# Patient Record
Sex: Male | Born: 1935 | Race: Black or African American | Hispanic: No | Marital: Single | Smoking: Former smoker
Health system: Southern US, Community
[De-identification: ages and names within clinical notes are randomized; demographics above are authoritative.]

## PROBLEM LIST (undated history)

## (undated) DIAGNOSIS — H9193 Unspecified hearing loss, bilateral: Secondary | ICD-10-CM

## (undated) DIAGNOSIS — I10 Essential (primary) hypertension: Secondary | ICD-10-CM

## (undated) DIAGNOSIS — M7520 Bicipital tendinitis, unspecified shoulder: Secondary | ICD-10-CM

## (undated) HISTORY — DX: Bicipital tendinitis, unspecified shoulder: M75.20

## (undated) HISTORY — PX: APPENDECTOMY: SHX54

## (undated) HISTORY — DX: Unspecified hearing loss, bilateral: H91.93

## (undated) HISTORY — DX: Essential (primary) hypertension: I10

---

## 2010-09-28 ENCOUNTER — Emergency Department (HOSPITAL_COMMUNITY)
Admission: EM | Admit: 2010-09-28 | Discharge: 2010-09-28 | Disposition: A | Discharge status: Home / Self Care | Payer: Medicare Other | Attending: Emergency Medicine | Admitting: Emergency Medicine

## 2010-09-28 DIAGNOSIS — I10 Essential (primary) hypertension: Secondary | ICD-10-CM | POA: Insufficient documentation

## 2010-09-28 DIAGNOSIS — K6289 Other specified diseases of anus and rectum: Secondary | ICD-10-CM | POA: Insufficient documentation

## 2010-09-28 DIAGNOSIS — K648 Other hemorrhoids: Secondary | ICD-10-CM | POA: Insufficient documentation

## 2010-10-12 HISTORY — PX: CATARACT EXTRACTION W/ INTRAOCULAR LENS IMPLANT: SHX1309

## 2010-12-23 ENCOUNTER — Encounter: Payer: Self-pay | Admitting: Internal Medicine

## 2010-12-23 ENCOUNTER — Ambulatory Visit (INDEPENDENT_AMBULATORY_CARE_PROVIDER_SITE_OTHER): Payer: Medicare Other | Admitting: Internal Medicine

## 2010-12-23 ENCOUNTER — Ambulatory Visit (INDEPENDENT_AMBULATORY_CARE_PROVIDER_SITE_OTHER)
Admission: RE | Admit: 2010-12-23 | Discharge: 2010-12-23 | Disposition: A | Discharge status: Home / Self Care | Payer: Medicare Other | Source: Ambulatory Visit | Attending: Internal Medicine | Admitting: Internal Medicine

## 2010-12-23 DIAGNOSIS — M7520 Bicipital tendinitis, unspecified shoulder: Secondary | ICD-10-CM

## 2010-12-23 DIAGNOSIS — I10 Essential (primary) hypertension: Secondary | ICD-10-CM

## 2010-12-23 DIAGNOSIS — M5412 Radiculopathy, cervical region: Secondary | ICD-10-CM

## 2010-12-23 DIAGNOSIS — Z0000 Encounter for general adult medical examination without abnormal findings: Secondary | ICD-10-CM

## 2010-12-23 NOTE — Patient Instructions (Signed)
Left shoulder pain: looks like two problems 1. Tendonitis of the biceps tendon - the pain at the front of the shoulder that is tender to touch. Plan - rub of choice, e.g. Icy-hot, aspercreme, etc           Aleve 1 or 2 twice a day           Range of motion exercise to stretch out the tendon: go to YouTube.com search biceps tendonitis and stretch and you will get lots of video instruction  2. Pain that radiates down the shoulder to the arm and to the hand raises the question of cervical arthritis with nerve root impingement Plan - x-rays of the neck today  Health maintenance - please obtain lab records from the [place].    Bicipital Tendonitis Bicipital tendonitis refers to redness, soreness, and swelling (inflammation) or irritation of the bicep tendon. The biceps muscle is located between the elbow and shoulder of the inner arm. The tendon heads, similar to pieces of rope, connect the bicep muscle to the shoulder socket. They are called short head and long head tendons. When tendonitis occurs, the long head tendon is inflamed and swollen, and may be thickened or partially torn.   Bicipital tendonitis can occur with other problems as well, such as arthritis in the shoulder or acromioclavicular joints, tears in the tendons, or other rotator cuff problems.   CAUSES   Overuse of of the arms for overhead activities is the major cause of tendonitis. Many athletes, such as swimmers, baseball players, and tennis players are prone to bicipital tendonitis. Jobs that require manual labor or routine chores, especially chores involving overhead activities can result in overuse and tendonitis. SYMPTOMS Symptoms may include:  Pain in and around the front of the shoulder. Pain may be worse with overhead motion.     Pain or aching that radiates down the arm.     Clicking or shifting sensations in the shoulder.  DIAGNOSIS Your caregiver may perform the following:  Physical exam and tests of the biceps and  shoulder to observe range of motion, strength, and stability.     X-rays or magnetic resonance imaging (MRI) to confirm the diagnosis. In most common cases, these tests are not necessary.  Since other problems may exist in the shoulder or rotator cuff, additional tests may be recommended. TREATMENT Treatment may include the following:  Medications     Your caregiver may prescribe over-the-counter pain relievers.     Steroid injections, such as cortisone, may be recommended. These may help to reduce inflammation and pain.     Physical Therapy - Your caregiver may recommend gentle exercises with the arm. These can help restore strength and range of motion. They may be done at home or with a physical therapist's supervision and input.     Surgery - Arthroscopic or open surgery sometimes is necessary. Surgery may include:     Reattachment or repair of the tendon at the shoulder socket.     Removal of the damaged section of the tendon.     Anchoring the tendon to a different area of the shoulder (tenodesis).  HOME CARE INSTRUCTIONS    Avoid overhead motion of the affected arm or any other motion that causes pain.     Take medication for pain as directed. Do not take these for more than 3 weeks, unless directed to do so by your caregiver.     Ice the affected area for 20 minutes at a time, 3-4 times  per day. Place a towel on the skin over the painful area and the ice or cold pack over the towel. Do not place ice directly on the skin.     Perform gentle exercises at home as directed. These will increase strength and flexibility.  PREVENTION  Modify your activities as much as possible to protect your arm. A physical therapist or sports medicine physician can help you understand options for safe motion.     Avoid repetitive overhead pulling, lifting, reaching, and throwing until your caregiver tells you it is ok to resume these activities.  SEEK MEDICAL CARE IF:  Your pain worsens.      You have difficulty moving the affected arm.     You have trouble performing any of the self-care instructions.  MAKE SURE YOU:    Understand these instructions.     Will watch your condition.     Will get help right away if you are not doing well or get worse.  Document Released: 01/30/2010 Document Revised: 09/09/2010 Document Reviewed: 01/30/2010 [facility] Patient Information 2012 [place], [place].

## 2010-12-23 NOTE — Progress Notes (Signed)
Subjective:    Patient ID: Jeff Lamb, male    DOB: 01-27-1929, 77 y.o.   MRN: [id number]  HPI Jeff Lamb presents to establish for on-going primary care. He has been getting his care from Urgent [facility] on [address].  His chief complaint is pain in the left shoulder area for the past six months. It is very positional/exertional related. The pain will radiate from the trapezius to the deltoid and occasionally to the hand. No weakness in the hand, no paresthesia. He describes the pain as a shooting pain. He was seen at Urgent care and did have a steroid trigger point injection. 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  Past Medical History  Diagnosis Date  . Hypertension   . 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 Appendectomy     31  . Cataract extraction w/ intraocular lens implant October '12    left.   Family History  Problem Relation Age of Onset  . Stroke Mother   . Hypertension Mother   . Cancer Maternal Uncle     throat cancer  . Diabetes Neg Hx   . Heart disease Neg Hx   . Cancer Maternal Uncle     throat cancer   History   Social History  . Marital Status: Single    Spouse Name: N/A    Number of Children: 1  . Years of Education: 12   Occupational History  . brakeman -railroad    Social History Main Topics  . Smoking status: Former Smoker -- 5.0 packs/day for 20 years    Types: Cigars    Quit date: 12/22/1980  . Smokeless tobacco: Never Used  . Alcohol Use: Yes     ,moderate use  . Drug Use: No  . Sexually Active: Not on file   Other Topics Concern  . 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 Married ''84 - 1 year /divorced. 1 son '62. 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 Lives alone. Has cousins in the area. Son is principle contact: [name]  (c) [phone].       Review of Systems Constitutional:  Negative for fever, chills, activity change and unexpected weight change.  HEENT:  Negative for hearing loss, ear pain, congestion, neck stiffness and postnasal drip. Negative for sore throat or swallowing problems. Negative for dental complaints.   Eyes: Negative for vision loss or change in visual acuity.  Respiratory: Negative for chest tightness and wheezing. Negative for DOE.   Cardiovascular: Negative for chest pain or palpitations. No decreased exercise tolerance Gastrointestinal: No change in bowel habit. No bloating or gas. No reflux or indigestion Genitourinary: Negative for urgency, frequency, flank pain and difficulty urinating.  Musculoskeletal: Negative for myalgias, back pain, arthralgias and gait problem.  Neurological: Negative for dizziness, tremors, weakness and headaches.  Hematological: Negative for adenopathy.  Psychiatric/Behavioral: Negative for behavioral problems and dysphoric mood.   No current outpatient prescriptions on file prior to visit.       Objective:   Physical Exam Vitals reviewed - normal Gen'l- WNWD AA man who looks younger than his stated age HEENT - [place]/AT, C&S clear, PERRLA Pulm - normal respirations Heart/vascular - 2+ radial pulse, RRR Ext - good passive ROM left  shoulder, no crepitus or click. Very tender to deep palpation short head of biceps, mild tenderness to palpation long head biceps. Full ROM neck.        Assessment & Plan:

## 2010-12-24 DIAGNOSIS — M7520 Bicipital tendinitis, unspecified shoulder: Secondary | ICD-10-CM | POA: Insufficient documentation

## 2010-12-24 DIAGNOSIS — Z0000 Encounter for general adult medical examination without abnormal findings: Secondary | ICD-10-CM | POA: Insufficient documentation

## 2010-12-24 HISTORY — DX: Bicipital tendinitis, unspecified shoulder: M75.20

## 2010-12-24 NOTE — Assessment & Plan Note (Addendum)
Patient followed by [facility] and prescribed ACE-I/Hct. BP marginally controlled with SBP 144  Plan - continue present medication

## 2010-12-24 NOTE — Assessment & Plan Note (Signed)
Exam reveals tenderness at the biceps tendons. He does have pain that radiates from trapezius to hand which may suggest concomitant pain related to mild cervical disease.  Plan - otc NSAID of choice, GI precautions given           Lineament and heat           Stretch - referred to [email or web address]           C-spine x-rays with recommendations to follow.

## 2010-12-24 NOTE — Assessment & Plan Note (Signed)
Patient reports he has been followed at the [place]. He is asked to release information including lab studies and any testing, e.g. EKGs, colonoscopy etc.  He is to return for a complete physical exam/Medicare wellness at his convenience.

## 2010-12-25 ENCOUNTER — Telehealth: Payer: Self-pay | Admitting: Internal Medicine

## 2010-12-25 NOTE — Telephone Encounter (Signed)
Received copies from [facility] on 12.14.12. Forwarded 32  pages to Dr. Debby Bud ,for review. sj

## 2010-12-27 ENCOUNTER — Telehealth: Payer: Self-pay | Admitting: Internal Medicine

## 2010-12-27 DIAGNOSIS — M79602 Pain in left arm: Secondary | ICD-10-CM

## 2010-12-27 NOTE — Telephone Encounter (Signed)
Cervical x-rays suggest a pinched nerve at C4-5. Will schedule an MRI. U will hear from the [facility] about time and place.

## 2010-12-28 NOTE — Telephone Encounter (Signed)
Informed pt .

## 2011-01-04 ENCOUNTER — Ambulatory Visit (HOSPITAL_COMMUNITY)
Admission: RE | Admit: 2011-01-04 | Discharge: 2011-01-04 | Disposition: A | Discharge status: Home / Self Care | Payer: Medicare Other | Source: Ambulatory Visit | Attending: Internal Medicine | Admitting: Internal Medicine

## 2011-01-04 DIAGNOSIS — M5030 Other cervical disc degeneration, unspecified cervical region: Secondary | ICD-10-CM | POA: Insufficient documentation

## 2011-01-04 DIAGNOSIS — M542 Cervicalgia: Secondary | ICD-10-CM | POA: Insufficient documentation

## 2011-01-04 DIAGNOSIS — M47812 Spondylosis without myelopathy or radiculopathy, cervical region: Secondary | ICD-10-CM | POA: Insufficient documentation

## 2011-01-04 DIAGNOSIS — M79609 Pain in unspecified limb: Secondary | ICD-10-CM | POA: Insufficient documentation

## 2011-01-04 DIAGNOSIS — M79602 Pain in left arm: Secondary | ICD-10-CM

## 2011-01-11 ENCOUNTER — Encounter: Payer: Self-pay | Admitting: Internal Medicine

## 2011-01-19 ENCOUNTER — Ambulatory Visit (INDEPENDENT_AMBULATORY_CARE_PROVIDER_SITE_OTHER): Payer: Medicare Other | Admitting: Internal Medicine

## 2011-01-19 DIAGNOSIS — I10 Essential (primary) hypertension: Secondary | ICD-10-CM

## 2011-01-19 DIAGNOSIS — M7520 Bicipital tendinitis, unspecified shoulder: Secondary | ICD-10-CM

## 2011-01-19 DIAGNOSIS — M5030 Other cervical disc degeneration, unspecified cervical region: Secondary | ICD-10-CM

## 2011-01-19 MED ORDER — VARDENAFIL HCL 20 MG PO TABS: 20.0000 mg | ORAL_TABLET | Freq: Every day | ORAL | Status: AC | PRN

## 2011-01-19 NOTE — Assessment & Plan Note (Signed)
May have a component of tendonitis contributing to his discomfort.

## 2011-01-19 NOTE — Assessment & Plan Note (Signed)
Mild discomfort without weakness or dense paresthesia.

## 2011-01-19 NOTE — Patient Instructions (Signed)
Arm pain - some degree of tendonitis as previously diagnosed. MRI scan does reveal diffuse degenerative disk disease with some nerve root encroachment.  Plan - use of anti-inflammatory drugs, i.e. Aleve twice a day.

## 2011-01-19 NOTE — Assessment & Plan Note (Signed)
BP Readings from Last 3 Encounters:  01/19/11 132/88  12/23/10 144/82   OK control

## 2011-01-19 NOTE — Progress Notes (Signed)
  Subjective:    Patient ID: Jeff Lamb, male    DOB: 11-23-1929, 78 y.o.   MRN: [id number]  HPI Mr. Mentink presents to review MRI scan - he has  Been having left UE pain. MRI reveals diffuse DDD, with some foraminal encroachment worse at C8 nerve root.   I have reviewed the patient's medical history in detail and updated the computerized patient record.    Review of Systems System review is negative for any constitutional, cardiac, pulmonary, GI or neuro symptoms or complaints other than as described in the HPI.     Objective:   Physical Exam Filed Vitals:   01/19/11 1021  BP: 132/88  Pulse: 54  Temp: 98.1 F (36.7 C)   WNWD AA man looking younger than age. HEENT - /AT, PUlm - respirations Cor - RRR MSK - negative Tinel's and Phalen's sing left wrist.        Assessment & Plan:  (greater than 50% of 25 min visit spent on education and counseling)

## 2011-07-20 ENCOUNTER — Telehealth: Payer: Self-pay | Admitting: Internal Medicine

## 2011-07-20 DIAGNOSIS — I10 Essential (primary) hypertension: Secondary | ICD-10-CM

## 2011-07-20 MED ORDER — LISINOPRIL-HYDROCHLOROTHIAZIDE 20-25 MG PO TABS: 1.0000 | ORAL_TABLET | Freq: Every day | ORAL | Status: DC

## 2011-07-20 NOTE — Telephone Encounter (Signed)
Patient notified of need for OV and Lab work. Rx sent to [facility]

## 2011-07-20 NOTE — Telephone Encounter (Signed)
Ok to refill but needs labs - metabolic panel.

## 2011-07-20 NOTE — Telephone Encounter (Signed)
Patient scheduled for next available ov which is the 08/02/11.  He stated he has 8 days left on his med and is hoping he can get a refill to last until the 22nd.    Thanks!

## 2011-07-20 NOTE — Telephone Encounter (Signed)
Pt req refill for Lisinopril HCTZ 20-25mg . Last OV was 01/2011 with Dr. Debby Bud. Pt stated that Dr. Andi Devon was the presciber. Ok to refill? Please call pt.

## 2011-07-21 ENCOUNTER — Other Ambulatory Visit: Payer: Self-pay | Admitting: *Deleted

## 2011-07-21 MED ORDER — LISINOPRIL-HYDROCHLOROTHIAZIDE 20-25 MG PO TABS: 1.0000 | ORAL_TABLET | Freq: Every day | ORAL | Status: DC

## 2011-07-21 NOTE — Telephone Encounter (Signed)
Refill on Rx sent to [facility]

## 2011-08-02 ENCOUNTER — Encounter: Payer: Self-pay | Admitting: Internal Medicine

## 2011-08-02 ENCOUNTER — Ambulatory Visit (INDEPENDENT_AMBULATORY_CARE_PROVIDER_SITE_OTHER): Payer: Medicare Other | Admitting: Internal Medicine

## 2011-08-02 VITALS — BP 128/82 | HR 63 | Temp 98.5°F | Resp 16 | Wt 193.0 lb

## 2011-08-02 DIAGNOSIS — N529 Male erectile dysfunction, unspecified: Secondary | ICD-10-CM | POA: Insufficient documentation

## 2011-08-02 DIAGNOSIS — I10 Essential (primary) hypertension: Secondary | ICD-10-CM

## 2011-08-02 MED ORDER — TADALAFIL 20 MG PO TABS
20.0000 mg | ORAL_TABLET | ORAL | Status: DC | PRN
Start: 2011-08-02 — End: 2013-07-12

## 2011-08-02 NOTE — Assessment & Plan Note (Signed)
Associated with BP medication. Can get an erection but cannot maintain it to complete intercourse. Has had success with Viagra. He also has an application for Pos-T-Vac device.

## 2011-08-02 NOTE — Progress Notes (Signed)
  Subjective:    Patient ID: Jeff Lamb, male    DOB: 05-13-1929, 78 y.o.   MRN: [id number]  HPI Mr. Harkleroad presents for evaluation of ED. He has a Vac-T-pump form to be completed. 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 He has had a good response to viagra in the past. After discussion he would like to try pharmaco therapy over mechanical therapy.  Past Medical History  Diagnosis Date  . Hypertension   . 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 Biceps tendonitis 12/24/2010   Past Surgical History  Procedure Date  . Appendectomy     31  . Cataract extraction w/ intraocular lens implant October '12    left.   Family History  Problem Relation Age of Onset  . Stroke Mother   . Hypertension Mother   . Cancer Maternal Uncle     throat cancer  . Diabetes Neg Hx   . Heart disease Neg Hx   . Cancer Maternal Uncle     throat cancer   History   Social History  . Marital Status: Single    Spouse Name: N/A    Number of Children: 1  . Years of Education: 12   Occupational History  . brakeman -railroad    Social History Main Topics  . Smoking status: Former Smoker -- 5.0 packs/day for 20 years    Types: Cigars    Quit date: 12/22/1980  . Smokeless tobacco: Never Used  . Alcohol Use: Yes     ,moderate use  . Drug Use: No  . Sexually Active: Not on file   Other Topics Concern  . 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 Married ''55 - 1 year /divorced. 1 son '62. 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 Lives alone. Has cousins in the area. Son is principle contact: [name] (c) [phone].    Current Outpatient Prescriptions on File Prior to Visit  Medication Sig Dispense Refill  . lisinopril-hydrochlorothiazide (PRINZIDE,ZESTORETIC) 20-25 MG per tablet Take 1 tablet by mouth daily. Patient needs to make appt. With Dr. Debby Bud for lab  work and OV  90 tablet  3  . tadalafil (CIALIS) 20 MG tablet Take 1 tablet (20 mg total) by mouth every other day as needed for erectile dysfunction.  5 tablet  11      Review of Systems System review is negative for any constitutional, cardiac, pulmonary, GI or neuro symptoms or complaints other than as described in the HPI.     Objective:   Physical Exam Filed Vitals:   08/02/11 1021  BP: 128/82  Pulse: 63  Temp: 98.5 F (36.9 C)  Resp: 16   Cor- RRR Pulm - normal respirations Neuro - A&O x 3.       Assessment & Plan:

## 2011-08-02 NOTE — Assessment & Plan Note (Signed)
BP Readings from Last 3 Encounters:  08/02/11 128/82  01/19/11 132/88  12/23/10 144/82

## 2012-06-20 ENCOUNTER — Ambulatory Visit: Payer: Medicare Other | Admitting: Internal Medicine

## 2012-07-13 ENCOUNTER — Ambulatory Visit (INDEPENDENT_AMBULATORY_CARE_PROVIDER_SITE_OTHER): Payer: Medicare Other | Admitting: Internal Medicine

## 2012-07-13 ENCOUNTER — Encounter: Payer: Self-pay | Admitting: Internal Medicine

## 2012-07-13 VITALS — BP 140/80 | HR 54 | Temp 97.0°F | Resp 12 | Ht 70.0 in | Wt 182.0 lb

## 2012-07-13 DIAGNOSIS — I10 Essential (primary) hypertension: Secondary | ICD-10-CM

## 2012-07-13 DIAGNOSIS — K649 Unspecified hemorrhoids: Secondary | ICD-10-CM

## 2012-07-13 DIAGNOSIS — R634 Abnormal weight loss: Secondary | ICD-10-CM

## 2012-07-13 NOTE — Patient Instructions (Addendum)
Hemorrhoids - best thing to do is to insure an easy, well formed bowel movement. Use a bulk laxative, e.g. Metamucil, benefiber, etc. Do not sit on the [place] for long. For a flare of hemorrhoids you can try preparation H, for pain you can use Nupercainal ointment (comes as a generic).  Weight -   Vitals - 1 value per visit 07/13/2012 08/02/2011 01/19/2011 12/23/2010  Weight (lb) 182 193 202 200   18 lbs in 18 months. The fact that you have no symptoms: belly pain, indigestion, bloating, etc makes it less worrisome.  If you continue to loose weight at this rate, 1 lb a month, you will need to have some testing.  Hemorrhoids Hemorrhoids are swollen veins around the rectum or anus. There are two types of hemorrhoids:   Internal hemorrhoids. These occur in the veins just inside the rectum. They may poke through to the outside and become irritated and painful.  External hemorrhoids. These occur in the veins outside the anus and can be felt as a painful swelling or hard lump near the anus. CAUSES  Pregnancy.   Obesity.   Constipation or diarrhea.   Straining to have a bowel movement.   Sitting for long periods on the toilet.  Heavy lifting or other activity that caused you to strain.  Anal intercourse. SYMPTOMS   Pain.   Anal itching or irritation.   Rectal bleeding.   Fecal leakage.   Anal swelling.   One or more lumps around the anus.  DIAGNOSIS  Your caregiver may be able to diagnose hemorrhoids by visual examination. Other examinations or tests that may be performed include:   Examination of the rectal area with a gloved hand (digital rectal exam).   Examination of anal canal using a small tube (scope).   A blood test if you have lost a significant amount of blood.  A test to look inside the colon (sigmoidoscopy or colonoscopy). TREATMENT Most hemorrhoids can be treated at home. However, if symptoms do not seem to be getting better or if you have a lot of  rectal bleeding, your caregiver may perform a procedure to help make the hemorrhoids get smaller or remove them completely. Possible treatments include:   Placing a rubber band at the base of the hemorrhoid to cut off the circulation (rubber band ligation).   Injecting a chemical to shrink the hemorrhoid (sclerotherapy).   Using a tool to burn the hemorrhoid (infrared light therapy).   Surgically removing the hemorrhoid (hemorrhoidectomy).   Stapling the hemorrhoid to block blood flow to the tissue (hemorrhoid stapling).  HOME CARE INSTRUCTIONS   Eat foods with fiber, such as whole grains, beans, nuts, fruits, and vegetables. Ask your doctor about taking products with added fiber in them (fibersupplements).  Increase fluid intake. Drink enough water and fluids to keep your urine clear or pale yellow.   Exercise regularly.   Go to the bathroom when you have the urge to have a bowel movement. Do not wait.   Avoid straining to have bowel movements.   Keep the anal area dry and clean. Use wet toilet paper or moist towelettes after a bowel movement.   Medicated creams and suppositories may be used or applied as directed.   Only take over-the-counter or prescription medicines as directed by your caregiver.   Take warm sitz baths for 15 20 minutes, 3 4 times a day to ease pain and discomfort.   Place ice packs on the hemorrhoids if they are tender and  swollen. Using ice packs between sitz baths may be helpful.   Put ice in a plastic bag.   Place a towel between your skin and the bag.   Leave the ice on for 15 20 minutes, 3 4 times a day.   Do not use a donut-shaped pillow or sit on the toilet for long periods. This increases blood pooling and pain.  SEEK MEDICAL CARE IF:  You have increasing pain and swelling that is not controlled by treatment or medicine.  You have uncontrolled bleeding.  You have difficulty or you are unable to have a bowel  movement.  You have pain or inflammation outside the area of the hemorrhoids. MAKE SURE YOU:  Understand these instructions.  Will watch your condition.  Will get help right away if you are not doing well or get worse. Document Released: 12/26/1999 Document Revised: 12/15/2011 Document Reviewed: 11/02/2011 [facility] Patient Information 2014 [place], [place].

## 2012-07-16 DIAGNOSIS — K649 Unspecified hemorrhoids: Secondary | ICD-10-CM | POA: Insufficient documentation

## 2012-07-16 NOTE — Assessment & Plan Note (Signed)
BP Readings from Last 3 Encounters:  07/13/12 140/80  08/02/11 128/82  01/19/11 132/88   Adequate control on present medications.  Plan Continue present regimen

## 2012-07-16 NOTE — Assessment & Plan Note (Signed)
Hemorrhoids - best thing to do is to insure an easy, well formed bowel movement. Use a bulk laxative, e.g. Metamucil, benefiber, etc. Do not sit on the [place] for long. For a flare of hemorrhoids you can try preparation H, for pain you can use Nupercainal ointment (comes as a generic).

## 2012-07-16 NOTE — Progress Notes (Signed)
Subjective:    Patient ID: Jeff Lamb, male    DOB: 06-12-1929, 79 y.o.   MRN: [id number]  HPI Jeff Lamb presents c/o hemorrhoidal discomfort now better than when he called for appointment. He has not had any bleeding or difficulty with defecation. This is not a new problem.   He is concerned about weight loss. He does report early satiety at times, poor appetite at times. He has had no fevers, chills, sweats, odynophagia, GERD, enlarged lymph nodes, change in bowel habit or any intercurrent illness. His sense of taste is preserved.  Past Medical History  Diagnosis Date  . Hypertension   . 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 Biceps tendonitis 12/24/2010   Past Surgical History  Procedure Laterality Date  . Appendectomy      31  . Cataract extraction w/ intraocular lens implant  October '12    left.   Family History  Problem Relation Age of Onset  . Stroke Mother   . Hypertension Mother   . Cancer Maternal Uncle     throat cancer  . Diabetes Neg Hx   . Heart disease Neg Hx   . Cancer Maternal Uncle     throat cancer   History   Social History  . Marital Status: Single    Spouse Name: N/A    Number of Children: 1  . Years of Education: 12   Occupational History  . brakeman -railroad    Social History Main Topics  . Smoking status: Former Smoker -- 5.00 packs/day for 20 years    Types: Cigars    Quit date: 12/22/1980  . Smokeless tobacco: Never Used  . Alcohol Use: Yes     Comment: ,moderate use  . Drug Use: No  . Sexually Active: Not on file   Other Topics Concern  . 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 Married ''16 - 1 year /divorced. 1 son '62. 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 Lives alone. Has cousins in the area. Son is principle contact: [name] (c) [phone].    Current Outpatient Prescriptions on File Prior  to Visit  Medication Sig Dispense Refill  . lisinopril-hydrochlorothiazide (PRINZIDE,ZESTORETIC) 20-25 MG per tablet Take 1 tablet by mouth daily. Patient needs to make appt. With Dr. Debby Bud for lab work and OV  90 tablet  3  . tadalafil (CIALIS) 20 MG tablet Take 1 tablet (20 mg total) by mouth every other day as needed for erectile dysfunction.  5 tablet  11   No current facility-administered medications on file prior to visit.      Review of Systems Constitutional:  Negative for fever, chills, activity change and unexpected weight change.  HEENT:  Negative for hearing loss, ear pain, congestion, neck stiffness and postnasal drip. Negative for sore throat or swallowing problems. Negative for dental complaints.   Eyes: Negative for vision loss or change in visual acuity.  Respiratory: Negative for chest tightness and wheezing. Negative for DOE.   Cardiovascular: Negative for chest pain or palpitations. No decreased exercise tolerance Gastrointestinal: No change in bowel habit. No bloating or gas. No reflux or indigestion Genitourinary: Negative for urgency, frequency, flank pain and difficulty urinating.  Musculoskeletal: Negative for myalgias, back pain, arthralgias and gait problem.  Neurological: Negative for dizziness, tremors, weakness and headaches.  Hematological: Negative for adenopathy.  Psychiatric/Behavioral: Negative for behavioral problems and dysphoric mood.       Objective:   Physical Exam Filed Vitals:   07/13/12 1530  BP: 140/80  Pulse: 54  Temp: 97 F (36.1 C)  Resp: 12   Wt Readings from Last 3 Encounters:  07/13/12 182 lb (82.555 kg)  08/02/11 193 lb (87.544 kg)  01/19/11 202 lb (91.627 kg)   Gen'l- WNWD AA man in no distress HEENT - C&S w/o icterus Cor- RRR Pulm - normal respirations Abd - normal BS w/o borborygmi, not tender Rectal - deferred Neuro - a&O x 3      Assessment & Plan:  1. Weight loss    Vitals - 1 value per visit 07/13/2012  08/02/2011 01/19/2011 12/23/2010  Weight (lb) 182 193 202 200   18 lbs in 18 months. The fact that you have no symptoms: belly pain, indigestion, bloating, etc makes it less worrisome.  If you continue to loose weight at this rate, 1 lb a month, you will need to have some testing.

## 2012-08-10 ENCOUNTER — Other Ambulatory Visit: Payer: Self-pay | Admitting: Internal Medicine

## 2013-02-19 ENCOUNTER — Ambulatory Visit (INDEPENDENT_AMBULATORY_CARE_PROVIDER_SITE_OTHER): Payer: Medicare Other | Admitting: Internal Medicine

## 2013-02-19 ENCOUNTER — Encounter: Payer: Self-pay | Admitting: Internal Medicine

## 2013-02-19 DIAGNOSIS — Z0000 Encounter for general adult medical examination without abnormal findings: Secondary | ICD-10-CM

## 2013-02-19 DIAGNOSIS — Z0289 Encounter for other administrative examinations: Secondary | ICD-10-CM

## 2013-02-19 DIAGNOSIS — I10 Essential (primary) hypertension: Secondary | ICD-10-CM
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
## 2013-02-22 ENCOUNTER — Ambulatory Visit: Payer: Medicare Other | Admitting: Internal Medicine

## 2013-03-22 ENCOUNTER — Ambulatory Visit: Payer: Medicare Other | Admitting: Internal Medicine

## 2013-04-14 NOTE — Progress Notes (Signed)
   Subjective:    Patient ID: Jeff DownerJohn Lamb, male    DOB: [birth year], 80 y.o.   MRN: [id number]  HPI Did not show up for appointment   Review of Systems     Objective:   Physical Exam        Assessment & Plan:

## 2013-07-05 ENCOUNTER — Ambulatory Visit: Payer: Medicare Other | Admitting: Internal Medicine

## 2013-07-12 ENCOUNTER — Ambulatory Visit (INDEPENDENT_AMBULATORY_CARE_PROVIDER_SITE_OTHER): Payer: 59 | Admitting: Internal Medicine

## 2013-07-12 ENCOUNTER — Encounter: Payer: Self-pay | Admitting: Internal Medicine

## 2013-07-12 VITALS — BP 168/92 | HR 56 | Temp 98.2°F | Wt 181.2 lb

## 2013-07-12 DIAGNOSIS — E785 Hyperlipidemia, unspecified: Secondary | ICD-10-CM | POA: Insufficient documentation

## 2013-07-12 DIAGNOSIS — I63332 Cerebral infarction due to thrombosis of left posterior cerebral artery: Secondary | ICD-10-CM

## 2013-07-12 DIAGNOSIS — R609 Edema, unspecified: Secondary | ICD-10-CM | POA: Insufficient documentation

## 2013-07-12 DIAGNOSIS — I639 Cerebral infarction, unspecified: Secondary | ICD-10-CM | POA: Insufficient documentation

## 2013-07-12 DIAGNOSIS — I6330 Cerebral infarction due to thrombosis of unspecified cerebral artery: Secondary | ICD-10-CM

## 2013-07-12 DIAGNOSIS — Z8673 Personal history of transient ischemic attack (TIA), and cerebral infarction without residual deficits: Secondary | ICD-10-CM | POA: Insufficient documentation

## 2013-07-12 DIAGNOSIS — I10 Essential (primary) hypertension: Secondary | ICD-10-CM

## 2013-07-12 MED ORDER — HYDROCHLOROTHIAZIDE 12.5 MG PO CAPS: 12.5000 mg | ORAL_CAPSULE | Freq: Every day | ORAL | Status: DC

## 2013-07-12 MED ORDER — ATORVASTATIN CALCIUM 40 MG PO TABS: 40.0000 mg | ORAL_TABLET | Freq: Every day | ORAL | Status: DC

## 2013-07-12 NOTE — Progress Notes (Signed)
Pre visit review using our clinic review tool, if applicable. No additional management support is needed unless otherwise documented below in the visit note. 

## 2013-07-12 NOTE — Patient Instructions (Addendum)
Minimal Blood Pressure Goal= AVERAGE < 140/90;  Ideal is an AVERAGE < 135/85. This AVERAGE should be calculated from @ least 5-7 BP readings taken @ different times of day on different days of week. You should not respond to isolated BP readings , but rather the AVERAGE for that week .Please bring your  blood pressure cuff to office visits to verify that it is reliable.It  can also be checked against the blood pressure device at the pharmacy. Finger or wrist cuffs are not dependable; an arm cuff is. Please  schedule fasting Labs i 10 weeks ( before end of September) : BMET,Lipids, hepatic panel, TSH, CK. Please sign a release of records to [facility]  for records related to 06/10/13-06/22/13 admission.

## 2013-07-12 NOTE — Progress Notes (Signed)
   Subjective:    Patient ID: Jeff Lamb, male    DOB: May 30, 1929, 80 y.o.   MRN: [id number]  HPI  [facility].  [place] in [place] a [place] & staying in a motel. When he attempted to arise from the bed he could not " find the floor". He also noted bright lights in the right eye.  MRI revealed a left PCA infarct with acute ischemic changes in the left occipital lobe and dorsal left parahippocampal gyrus. There was no evidence of hemorrhage.  He was placed on low-dose aspirin and 40 mg of atorvastatin.  He states there were no driving restrictions. He was given exercises for physical therapy which he has not implemented.  He states he is asymptomatic except for intermittent visual hallucinations of an object moving rapidly through his field of vision on the right.  Review of Systems  He denies fever, chills, sweats, or unexplained weight loss. There is no numbness, tingling, weakness in extremities. He also has not lost control of his bladder or bowels.   Chest pain, palpitations, tachycardia, exertional dyspnea, paroxysmal nocturnal dyspnea, claudication or edema are absent.       Objective:   Physical Exam  Significant or distinguishing  findings on physical exam are documented first.  Below that are other systems examined & findings.  Arcus senilis. Dense plaque is noted over the molar teeth with fair-poor hygiene. There is decreased range of motion of the cervical spine The second heart sound is increased. There is an umbilical hernia present. He has trace pedal edema.  General appearance:adequate nourishment w/o distress. Eyes: No conjunctival inflammation or scleral icterus is present.FOV WNL Heart:  Normal rate and regular rhythm. S2 normal without gallop, murmur, click, rub or other extra sounds   Lungs:Chest clear to auscultation; no wheezes, rhonchi,rales ,or rubs present.No increased work of breathing.  Abdomen:  bowel sounds normal, soft and non-tender without masses, organomegaly or hernias noted.  No guarding or rebound  Skin:Warm & dry.  Intact without suspicious lesions or rashes ; no jaundice or tenting Lymphatic: No lymphadenopathy is noted about the head, neck, axilla Gait including heel/toe walking normal.DTRs, strength & tone WNL            Assessment & Plan:  #1 S/P CVA #2 HTN #3 edema See orders & AVS

## 2013-07-16 ENCOUNTER — Telehealth: Payer: Self-pay | Admitting: Internal Medicine

## 2013-07-16 NOTE — Telephone Encounter (Signed)
Relevant patient education assigned to patient using Emmi. ° °

## 2013-08-31 ENCOUNTER — Other Ambulatory Visit: Payer: Self-pay | Admitting: *Deleted

## 2013-08-31 MED ORDER — LISINOPRIL-HYDROCHLOROTHIAZIDE 20-25 MG PO TABS: ORAL_TABLET | ORAL | Status: DC

## 2013-10-08 ENCOUNTER — Other Ambulatory Visit: Payer: Self-pay | Admitting: Internal Medicine

## 2013-10-11 ENCOUNTER — Ambulatory Visit: Payer: Medicare Other | Admitting: Internal Medicine

## 2013-10-15 ENCOUNTER — Other Ambulatory Visit: Payer: Self-pay | Admitting: Internal Medicine

## 2013-10-16 ENCOUNTER — Encounter: Payer: Self-pay | Admitting: Internal Medicine

## 2013-10-16 ENCOUNTER — Ambulatory Visit (INDEPENDENT_AMBULATORY_CARE_PROVIDER_SITE_OTHER): Payer: 59 | Admitting: Internal Medicine

## 2013-10-16 VITALS — BP 180/100 | HR 50 | Temp 97.9°F | Resp 18 | Ht 70.0 in | Wt 181.8 lb

## 2013-10-16 DIAGNOSIS — Z23 Encounter for immunization: Secondary | ICD-10-CM

## 2013-10-16 DIAGNOSIS — Z0000 Encounter for general adult medical examination without abnormal findings: Secondary | ICD-10-CM

## 2013-10-16 DIAGNOSIS — E785 Hyperlipidemia, unspecified: Secondary | ICD-10-CM

## 2013-10-16 DIAGNOSIS — I63332 Cerebral infarction due to thrombosis of left posterior cerebral artery: Secondary | ICD-10-CM

## 2013-10-16 DIAGNOSIS — I10 Essential (primary) hypertension: Secondary | ICD-10-CM

## 2013-10-16 MED ORDER — ATORVASTATIN CALCIUM 40 MG PO TABS: 40.0000 mg | ORAL_TABLET | Freq: Every day | ORAL | Status: DC

## 2013-10-16 MED ORDER — HYDROCHLOROTHIAZIDE 12.5 MG PO CAPS: 12.5000 mg | ORAL_CAPSULE | Freq: Every day | ORAL | Status: DC

## 2013-10-16 NOTE — Assessment & Plan Note (Signed)
No new symptoms and is performing all ADLs independently. He lives alone and is doing well. Advised him that it is important to take BP meds and cholesterol meds and ASA to prevent future stroke and given information about stroke prevention.

## 2013-10-16 NOTE — Progress Notes (Signed)
   Subjective:    Patient ID: Jeff DownerJohn Lamb, male    DOB: Jul 14, 1929, 80 y.o.   MRN: [id number]  HPI The patient is an 80 YO man who is coming in today to establish care. He has PMH of CVA, hemorrhoids, HTN, hyperlipidemia. He is doing well at today's visit. He denies any problems with stroke like symptoms. He is out of his blood pressure medicines for about 1 week now. He denies headache, nausea, vomiting, confusion. His balance is normal and he denies recent falls. He denies chest pains, SOB with exertion. He denies abdominal pain, constipation, diarrhea. He has never had colonoscopy and does not wish to pursue that. He quit smoking a long time ago and has not restarted.  Review of Systems  Constitutional: Negative for fever, activity change, appetite change and fatigue.  HENT: Negative.   Eyes: Negative.   Respiratory: Negative for cough, chest tightness, shortness of breath and wheezing.   Cardiovascular: Negative for chest pain, palpitations and leg swelling.  Gastrointestinal: Negative for abdominal pain, diarrhea, constipation, blood in stool and abdominal distention.  Genitourinary: Negative.   Musculoskeletal: Positive for back pain. Negative for gait problem and myalgias.  Skin: Negative.   Neurological: Negative for dizziness, tremors, syncope, facial asymmetry, speech difficulty, weakness, light-headedness, numbness and headaches.  Psychiatric/Behavioral: Negative.       Objective:   Physical Exam  Constitutional: He is oriented to person, place, and time. He appears well-developed and well-nourished.  HENT:  Head: Normocephalic and atraumatic.  Eyes: EOM are normal.  Neck: Normal range of motion.  Cardiovascular: Normal rate and regular rhythm.   Pulmonary/Chest: Effort normal and breath sounds normal. No respiratory distress. He has no wheezes. He has no rales.  Abdominal: Soft. Bowel sounds are normal. He exhibits no distension. There is no tenderness. There is no rebound.    Neurological: He is alert and oriented to person, place, and time. Coordination normal.  Skin: Skin is warm and dry.   Filed Vitals:   10/16/13 0806  BP: 180/100  Pulse: 50  Temp: 97.9 F (36.6 C)  TempSrc: Oral  Resp: 18  Height: 5\' 10"  (1.778 m)  Weight: 181 lb 12.8 oz (82.464 kg)  SpO2: 96%      Assessment & Plan:  Flu shot given today.

## 2013-10-16 NOTE — Assessment & Plan Note (Signed)
Documented patient's pneumonia shot, he declines ever having colonoscopy and does not wish to pursue. Given flu shot at today's visit.

## 2013-10-16 NOTE — Progress Notes (Signed)
Pre visit review using our clinic review tool, if applicable. No additional management support is needed unless otherwise documented below in the visit note. 

## 2013-10-16 NOTE — Assessment & Plan Note (Signed)
Will check lipid panel at next lab draw. Continue lipitor.

## 2013-10-16 NOTE — Patient Instructions (Signed)
We have given you the flu shot today. We are refilling your medicines today and want you to come back in about 1 month for a blood pressure check.   Come back to see the doctor in about 6 months to check on the blood pressure.   Stroke Prevention Some medical conditions and behaviors are associated with an increased chance of having a stroke. You may prevent a stroke by making healthy choices and managing medical conditions. HOW CAN I REDUCE MY RISK OF HAVING A STROKE?   Stay physically active. Get at least 30 minutes of activity on most or all days.  Do not smoke. It may also be helpful to avoid exposure to secondhand smoke.  Limit alcohol use. Moderate alcohol use is considered to be:  No more than 2 drinks per day for men.  No more than 1 drink per day for nonpregnant women.  Eat healthy foods. This involves:  Eating 5 or more servings of fruits and vegetables a day.  Making dietary changes that address high blood pressure (hypertension), high cholesterol, diabetes, or obesity.  Manage your cholesterol levels.  Making food choices that are high in fiber and low in saturated fat, trans fat, and cholesterol may control cholesterol levels.  Take any prescribed medicines to control cholesterol as directed by your health care provider.  Manage your diabetes.  Controlling your carbohydrate and sugar intake is recommended to manage diabetes.  Take any prescribed medicines to control diabetes as directed by your health care provider.  Control your hypertension.  Making food choices that are low in salt (sodium), saturated fat, trans fat, and cholesterol is recommended to manage hypertension.  Take any prescribed medicines to control hypertension as directed by your health care provider.  Maintain a healthy weight.  Reducing calorie intake and making food choices that are low in sodium, saturated fat, trans fat, and cholesterol are recommended to manage weight.  Stop drug  abuse.  Avoid taking birth control pills.  Talk to your health care provider about the risks of taking birth control pills if you are over 29 years old, smoke, get migraines, or have ever had a blood clot.  Get evaluated for sleep disorders (sleep apnea).  Talk to your health care provider about getting a sleep evaluation if you snore a lot or have excessive sleepiness.  Take medicines only as directed by your health care provider.  For some people, aspirin or blood thinners (anticoagulants) are helpful in reducing the risk of forming abnormal blood clots that can lead to stroke. If you have the irregular heart rhythm of atrial fibrillation, you should be on a blood thinner unless there is a good reason you cannot take them.  Understand all your medicine instructions.  Make sure that other conditions (such as anemia or atherosclerosis) are addressed. SEEK IMMEDIATE MEDICAL CARE IF:   You have sudden weakness or numbness of the face, arm, or leg, especially on one side of the body.  Your face or eyelid droops to one side.  You have sudden confusion.  You have trouble speaking (aphasia) or understanding.  You have sudden trouble seeing in one or both eyes.  You have sudden trouble walking.  You have dizziness.  You have a loss of balance or coordination.  You have a sudden, severe headache with no known cause.  You have new chest pain or an irregular heartbeat. Any of these symptoms may represent a serious problem that is an emergency. Do not wait to see if  the symptoms will go away. Get medical help at once. Call your local emergency services (911 in [place] Do not drive yourself to the hospital. Document Released: 02/05/2004 Document Revised: 05/14/2013 Document Reviewed: 06/30/2012 [facility] Patient Information 2015 [employer] [place]. This information is not intended to replace advice given to you by your health care provider. Make sure you discuss any questions you have with  your health care provider.

## 2013-10-16 NOTE — Assessment & Plan Note (Addendum)
BP high today but out of meds for 1 week. No symptoms. Patient was previously on lisinopril/HCTZ but states that he does not want to go back on that as he was taking it when he had his stroke. Will resume HCTZ 12.5 mg daily and bring back for a nurse BP check in 1 month. If BP still high will increase HCTZ to 25 mg daily. Forgot to remind patient to come to lab so will catch him at nurse visit.

## 2013-11-20 ENCOUNTER — Other Ambulatory Visit (INDEPENDENT_AMBULATORY_CARE_PROVIDER_SITE_OTHER): Payer: 59

## 2013-11-20 ENCOUNTER — Ambulatory Visit (INDEPENDENT_AMBULATORY_CARE_PROVIDER_SITE_OTHER): Payer: 59 | Admitting: Internal Medicine

## 2013-11-20 ENCOUNTER — Encounter: Payer: Self-pay | Admitting: Internal Medicine

## 2013-11-20 VITALS — BP 142/64 | HR 59 | Temp 98.4°F | Resp 12 | Ht 70.0 in | Wt 187.4 lb

## 2013-11-20 DIAGNOSIS — I10 Essential (primary) hypertension: Secondary | ICD-10-CM

## 2013-11-20 DIAGNOSIS — H6121 Impacted cerumen, right ear: Secondary | ICD-10-CM

## 2013-11-20 DIAGNOSIS — H6120 Impacted cerumen, unspecified ear: Secondary | ICD-10-CM | POA: Insufficient documentation

## 2013-11-20 LAB — BASIC METABOLIC PANEL
BUN: 16 mg/dL (ref 6–23)
CALCIUM: 9.4 mg/dL (ref 8.4–10.5)
CO2: 30 meq/L (ref 19–32)
CREATININE: 1.3 mg/dL (ref 0.4–1.5)
Chloride: 104 mEq/L (ref 96–112)
GFR: 68.47 mL/min (ref >60.00)
Glucose, Bld: 103 mg/dL — ABNORMAL HIGH (ref 70–99)
Potassium: 3.9 mEq/L (ref 3.5–5.1)
Sodium: 143 mEq/L (ref 135–145)

## 2013-11-20 NOTE — Progress Notes (Signed)
Pre visit review using our clinic review tool, if applicable. No additional management support is needed unless otherwise documented below in the visit note. 

## 2013-11-20 NOTE — Assessment & Plan Note (Signed)
Cerumen disimpaction done bilaterally. Patient hearing much better after ears drums are clear.

## 2013-11-20 NOTE — Patient Instructions (Signed)
Your blood pressure is much better today. We are not going to change her medicines today.  We have cleaned out your ears to see if this helps with the ringing in your ears you been having.  Come back in about 6-12 months for a checkup.  We will check your blood work today to make sure that your blood pressure medicine isn't affecting your kidneys.

## 2013-11-20 NOTE — Assessment & Plan Note (Signed)
BP much better controlled on hydrochlorothiazide. Check basic metabolic.

## 2013-11-20 NOTE — Progress Notes (Signed)
   Subjective:    Patient ID: Jeff Lamb, male    DOB: January 28, 1929, 80 y.o.   MRN: [id number]  HPI The patient is an 80 year old man coming in today for a follow-up on his blood pressure. He has been taking the hydrochlorothiazide without any problems. He denies dizziness, headache, chest pain, shortness of breath. He is having some tinnitus and would like me to look in his ears. He does use hearing aids all the time however his hearing has been worse recently.  Review of Systems  Constitutional: Negative for fever, activity change, appetite change and unexpected weight change.  HENT: Positive for hearing loss. Negative for ear discharge and ear pain.   Respiratory: Negative for cough, chest tightness, shortness of breath and wheezing.   Cardiovascular: Negative for chest pain, palpitations and leg swelling.  Gastrointestinal: Negative for abdominal pain, diarrhea, constipation and abdominal distention.  Musculoskeletal: Negative.   Neurological: Negative.       Objective:   Physical Exam  Constitutional: He is oriented to person, place, and time. He appears well-developed and well-nourished.  HENT:  Head: Normocephalic and atraumatic.  Right ear with a wax obstructing the canal, left ear with mild amount of wax, TM normal.  Eyes: EOM are normal.  Neck: Normal range of motion.  Cardiovascular: Normal rate and regular rhythm.   Pulmonary/Chest: Effort normal and breath sounds normal.  Abdominal: Soft. Bowel sounds are normal.  Musculoskeletal: Normal range of motion.  Neurological: He is alert and oriented to person, place, and time. Coordination normal.  Skin: Skin is warm and dry.   Filed Vitals:   11/20/13 0913  BP: 142/64  Pulse: 59  Temp: 98.4 F (36.9 C)  TempSrc: Oral  Resp: 12  Height: 5\' 10"  (1.778 m)  Weight: 187 lb 6.4 oz (85.004 kg)  SpO2: 97%      Assessment & Plan:

## 2014-01-05 ENCOUNTER — Other Ambulatory Visit: Payer: Self-pay | Admitting: Internal Medicine

## 2014-03-04 ENCOUNTER — Encounter: Payer: 59 | Admitting: Internal Medicine

## 2014-03-21 ENCOUNTER — Ambulatory Visit (INDEPENDENT_AMBULATORY_CARE_PROVIDER_SITE_OTHER): Payer: Medicare Other | Admitting: Internal Medicine

## 2014-03-21 ENCOUNTER — Other Ambulatory Visit (INDEPENDENT_AMBULATORY_CARE_PROVIDER_SITE_OTHER): Payer: Medicare Other

## 2014-03-21 ENCOUNTER — Encounter: Payer: Self-pay | Admitting: Internal Medicine

## 2014-03-21 VITALS — BP 122/82 | HR 64 | Temp 98.9°F | Resp 12 | Ht 70.0 in | Wt 201.8 lb

## 2014-03-21 DIAGNOSIS — Z418 Encounter for other procedures for purposes other than remedying health state: Secondary | ICD-10-CM | POA: Diagnosis not present

## 2014-03-21 DIAGNOSIS — Z0000 Encounter for general adult medical examination without abnormal findings: Secondary | ICD-10-CM | POA: Diagnosis not present

## 2014-03-21 DIAGNOSIS — I10 Essential (primary) hypertension: Secondary | ICD-10-CM

## 2014-03-21 DIAGNOSIS — Z23 Encounter for immunization: Secondary | ICD-10-CM

## 2014-03-21 DIAGNOSIS — Z299 Encounter for prophylactic measures, unspecified: Secondary | ICD-10-CM

## 2014-03-21 DIAGNOSIS — E785 Hyperlipidemia, unspecified: Secondary | ICD-10-CM

## 2014-03-21 LAB — CBC
HCT: 44.2 % (ref 39.0–52.0)
Hemoglobin: 14.8 g/dL (ref 13.0–17.0)
MCHC: 33.5 g/dL (ref 30.0–36.0)
MCV: 91.7 fl (ref 78.0–100.0)
Platelets: 172 10*3/uL (ref 150.0–400.0)
RBC: 4.82 Mil/uL (ref 4.22–5.81)
RDW: 13.5 % (ref 11.5–15.5)
WBC: 8 10*3/uL (ref 4.0–10.5)

## 2014-03-21 LAB — LIPID PANEL
Cholesterol: 93 mg/dL (ref 0–200)
HDL: 51.8 mg/dL (ref >39.00)
LDL Cholesterol: 33 mg/dL (ref 0–99)
NonHDL: 41.2
TRIGLYCERIDES: 42 mg/dL (ref 0.0–149.0)
Total CHOL/HDL Ratio: 2
VLDL: 8.4 mg/dL (ref 0.0–40.0)

## 2014-03-21 LAB — COMPREHENSIVE METABOLIC PANEL
ALK PHOS: 59 U/L (ref 39–117)
ALT: 26 U/L (ref 0–53)
AST: 32 U/L (ref 0–37)
Albumin: 4.1 g/dL (ref 3.5–5.2)
BUN: 21 mg/dL (ref 6–23)
CALCIUM: 9.6 mg/dL (ref 8.4–10.5)
CHLORIDE: 101 meq/L (ref 96–112)
CO2: 35 meq/L — AB (ref 19–32)
Creatinine, Ser: 1.32 mg/dL (ref 0.40–1.50)
GFR: 66.62 mL/min (ref >60.00)
Glucose, Bld: 93 mg/dL (ref 70–99)
Potassium: 4.3 mEq/L (ref 3.5–5.1)
Sodium: 138 mEq/L (ref 135–145)
Total Bilirubin: 1.2 mg/dL (ref 0.2–1.2)
Total Protein: 7.4 g/dL (ref 6.0–8.3)

## 2014-03-21 NOTE — Patient Instructions (Signed)
You are doing well, we will keep the medicines the same.  For the back, you can keep using the alleve or rubbing down the back.  We will check the blood work today and call you back with the results.   We will see you back in about 6 months to check on the blood pressure, if you have any problems or questions before then please feel free to call us.   Health Maintenance A healthy lifestyle and preventative care can promote health and wellness.  Maintain regular health, dental, and eye exams.  Eat a healthy diet. Foods like vegetables, fruits, whole grains, low-fat dairy products, and lean protein foods contain the nutrients you need and are low in calories. Decrease your intake of foods high in solid fats, added sugars, and salt. Get information about a proper diet from your health care provider, if necessary.  Regular physical exercise is one of the most important things you can do for your health. Most adults should get at least 150 minutes of moderate-intensity exercise (any activity that increases your heart rate and causes you to sweat) each week. In addition, most adults need muscle-strengthening exercises on 2 or more days a week.   Maintain a healthy weight. The body mass index (BMI) is a screening tool to identify possible weight problems. It provides an estimate of body fat based on height and weight. Your health care provider can find your BMI and can help you achieve or maintain a healthy weight. For males 20 years and older:  A BMI below 18.5 is considered underweight.  A BMI of 18.5 to 24.9 is normal.  A BMI of 25 to 29.9 is considered overweight.  A BMI of 30 and above is considered obese.  Maintain normal blood lipids and cholesterol by exercising and minimizing your intake of saturated fat. Eat a balanced diet with plenty of fruits and vegetables. Blood tests for lipids and cholesterol should begin at age 69 and be repeated every 5 years. If your lipid or cholesterol  levels are high, you are over age 39, or you are at high risk for heart disease, you may need your cholesterol levels checked more frequently.Ongoing high lipid and cholesterol levels should be treated with medicines if diet and exercise are not working.  If you smoke, find out from your health care provider how to quit. If you do not use tobacco, do not start.  Lung cancer screening is recommended for adults aged 55-80 years who are at high risk for developing lung cancer because of a history of smoking. A yearly low-dose CT scan of the lungs is recommended for people who have at least a 30-pack-year history of smoking and are current smokers or have quit within the past 15 years. A pack year of smoking is smoking an average of 1 pack of cigarettes a day for 1 year (for example, a 30-pack-year history of smoking could mean smoking 1 pack a day for 30 years or 2 packs a day for 15 years). Yearly screening should continue until the smoker has stopped smoking for at least 15 years. Yearly screening should be stopped for people who develop a health problem that would prevent them from having lung cancer treatment.  If you choose to drink alcohol, do not have more than 2 drinks per day. One drink is considered to be 12 oz (360 mL) of beer, 5 oz (150 mL) of wine, or 1.5 oz (45 mL) of liquor.  Avoid the use of street  drugs. Do not share needles with anyone. Ask for help if you need support or instructions about stopping the use of drugs.  High blood pressure causes heart disease and increases the risk of stroke. Blood pressure should be checked at least every 1-2 years. Ongoing high blood pressure should be treated with medicines if weight loss and exercise are not effective.  If you are 29-46 years old, ask your health care provider if you should take aspirin to prevent heart disease.  Diabetes screening involves taking a blood sample to check your fasting blood sugar level. This should be done once every  3 years after age 48 if you are at a normal weight and without risk factors for diabetes. Testing should be considered at a younger age or be carried out more frequently if you are overweight and have at least 1 risk factor for diabetes.  Colorectal cancer can be detected and often prevented. Most routine colorectal cancer screening begins at the age of 17 and continues through age 71. However, your health care provider may recommend screening at an earlier age if you have risk factors for colon cancer. On a yearly basis, your health care provider may provide home test kits to check for hidden blood in the stool. A small camera at the end of a tube may be used to directly examine the colon (sigmoidoscopy or colonoscopy) to detect the earliest forms of colorectal cancer. Talk to your health care provider about this at age 17 when routine screening begins. A direct exam of the colon should be repeated every 5-10 years through age 57, unless early forms of precancerous polyps or small growths are found.  People who are at an increased risk for hepatitis B should be screened for this virus. You are considered at high risk for hepatitis B if:  You were born in a country where hepatitis B occurs often. Talk with your health care provider about which countries are considered high risk.  Your parents were born in a high-risk country and you have not received a shot to protect against hepatitis B (hepatitis B vaccine).  You have HIV or AIDS.  You use needles to inject street drugs.  You live with, or have sex with, someone who has hepatitis B.  You are a man who has sex with other men (MSM).  You get hemodialysis treatment.  You take certain medicines for conditions like cancer, organ transplantation, and autoimmune conditions.  Hepatitis C blood testing is recommended for all people born from [birth year] through 1963 and any individual with known risk factors for hepatitis C.  Healthy men should no longer  receive prostate-specific antigen (PSA) blood tests as part of routine cancer screening. Talk to your health care provider about prostate cancer screening.  Testicular cancer screening is not recommended for adolescents or adult males who have no symptoms. Screening includes self-exam, a health care provider exam, and other screening tests. Consult with your health care provider about any symptoms you have or any concerns you have about testicular cancer.  Practice safe sex. Use condoms and avoid high-risk sexual practices to reduce the spread of sexually transmitted infections (STIs).  You should be screened for STIs, including gonorrhea and chlamydia if:  You are sexually active and are younger than 24 years.  You are older than 24 years, and your health care provider tells you that you are at risk for this type of infection.  Your sexual activity has changed since you were last screened, and you  are at an increased risk for chlamydia or gonorrhea. Ask your health care provider if you are at risk.  If you are at risk of being infected with HIV, it is recommended that you take a prescription medicine daily to prevent HIV infection. This is called pre-exposure prophylaxis (PrEP). You are considered at risk if:  You are a man who has sex with other men (MSM).  You are a heterosexual man who is sexually active with multiple partners.  You take drugs by injection.  You are sexually active with a partner who has HIV.  Talk with your health care provider about whether you are at high risk of being infected with HIV. If you choose to begin PrEP, you should first be tested for HIV. You should then be tested every 3 months for as long as you are taking PrEP.  Use sunscreen. Apply sunscreen liberally and repeatedly throughout the day. You should seek shade when your shadow is shorter than you. Protect yourself by wearing long sleeves, pants, a wide-brimmed hat, and sunglasses year round whenever you  are outdoors.  Tell your health care provider of new moles or changes in moles, especially if there is a change in shape or color. Also, tell your health care provider if a mole is larger than the size of a pencil eraser.  A one-time screening for abdominal aortic aneurysm (AAA) and surgical repair of large AAAs by ultrasound is recommended for men aged 65-75 years who are current or former smokers.  Stay current with your vaccines (immunizations). Document Released: 06/26/2007 Document Revised: 01/02/2013 Document Reviewed: 05/25/2010 [facility] Patient Information 2015 [place], [place]. This information is not intended to replace advice given to you by your health care provider. Make sure you discuss any questions you have with your health care provider.

## 2014-03-21 NOTE — Assessment & Plan Note (Addendum)
Check lipid panel. Continue atorvastatin unless changes needed after results. No side effects and on for secondary prevention. NO CVA symptoms.

## 2014-03-21 NOTE — Progress Notes (Signed)
Pre visit review using our clinic review tool, if applicable. No additional management support is needed unless otherwise documented below in the visit note. 

## 2014-03-21 NOTE — Assessment & Plan Note (Signed)
Documented last tetanus shot. Given pneumonia 23 today to complete series. He is not sure if he has had shingles shot. [place] [facility] [place] and [place]. He has aged out of further screening. Are still waiting on records. He is a non-smoker and exercises daily. Talked with him about advanced directives and he has already spoken with his son about his preferences. Does not have living will documentation. Provided with a list of 10 year screening recommendations.

## 2014-03-21 NOTE — Progress Notes (Signed)
   Subjective:    Patient ID: Jeff DownerJohn Lamb, male    DOB: 07/06/1929, 81 y.o.   MRN: [id number]  HPI Here for medicare wellness, no new problems. Still taking his blood pressure medicine without problems. Still seeing his eye doctor and eyesight is improving some. He walks daily.   Diet: heart healthy Physical activity: walks daily Depression/mood screen: negative Hearing: decreased, uses hearing aids Visual acuity: night time vision poor does not drive at night, performs annual eye exam  ADLs: capable Fall risk: none Home safety: good Cognitive evaluation: intact to orientation, naming, recall and repetition EOL planning: adv directives, he has discussed with his son, no prolonged artifical support  I have personally reviewed and have noted 1. The patient's medical and social history - no updates today, reviewed with patient at visit 2. Their use of alcohol, tobacco or illicit drugs 3. Their current medications and supplements 4. The patient's functional ability including ADL's, fall risks, home safety risks and hearing or visual impairment. 5. Diet and physical activities 6. Evidence for depression or mood disorders 7. Care team reviewed and updated (available in snapshot)  Review of Systems  Constitutional: Negative for fever, activity change, appetite change and unexpected weight change.  HENT: Positive for hearing loss. Negative for ear discharge and ear pain.   Eyes: Negative.   Respiratory: Negative for cough, chest tightness, shortness of breath and wheezing.   Cardiovascular: Negative for chest pain, palpitations and leg swelling.  Gastrointestinal: Negative for abdominal pain, diarrhea, constipation and abdominal distention.  Genitourinary: Negative.   Musculoskeletal: Negative.   Skin: Negative.   Neurological: Negative.       Objective:   Physical Exam  Constitutional: He is oriented to person, place, and time. He appears well-developed and well-nourished.  HENT:    Head: Normocephalic and atraumatic.  Right ear with wax not obstructing canal, left ear without wax. Both TM clear  Eyes: EOM are normal.  Neck: Normal range of motion.  Cardiovascular: Normal rate and regular rhythm.   Pulmonary/Chest: Effort normal and breath sounds normal.  Abdominal: Soft. Bowel sounds are normal.  Musculoskeletal: Normal range of motion.  Neurological: He is alert and oriented to person, place, and time. Coordination normal.  Skin: Skin is warm and dry.  Psychiatric: He has a normal mood and affect.   Filed Vitals:   03/21/14 1014  BP: 150/88  Pulse: 64  Temp: 98.9 F (37.2 C)  TempSrc: Oral  Resp: 12  Height: 5\' 10"  (1.778 m)  Weight: 201 lb 12.8 oz (91.536 kg)  SpO2: 96%      Assessment & Plan:  Pneumonia 23 shot given today, right ear lavage done

## 2014-03-21 NOTE — Assessment & Plan Note (Signed)
Under good control with HCTZ 12.5. Check BMP today.

## 2014-09-11 ENCOUNTER — Other Ambulatory Visit: Payer: Self-pay | Admitting: Geriatric Medicine

## 2014-09-11 DIAGNOSIS — I10 Essential (primary) hypertension: Secondary | ICD-10-CM

## 2014-09-11 MED ORDER — HYDROCHLOROTHIAZIDE 12.5 MG PO CAPS: 12.5000 mg | ORAL_CAPSULE | Freq: Every day | ORAL | Status: DC

## 2014-09-16 ENCOUNTER — Other Ambulatory Visit: Payer: Self-pay | Admitting: Internal Medicine

## 2015-04-10 ENCOUNTER — Other Ambulatory Visit: Payer: Self-pay | Admitting: Internal Medicine

## 2015-08-07 ENCOUNTER — Other Ambulatory Visit: Payer: Self-pay | Admitting: Internal Medicine

## 2015-09-26 LAB — GLUCOSE, POCT (MANUAL RESULT ENTRY): POC Glucose: 111 mg/dl — AB (ref 70–99)

## 2015-10-13 ENCOUNTER — Encounter: Payer: Self-pay | Admitting: Internal Medicine

## 2015-10-13 ENCOUNTER — Ambulatory Visit (INDEPENDENT_AMBULATORY_CARE_PROVIDER_SITE_OTHER): Payer: Medicare Other | Admitting: Internal Medicine

## 2015-10-13 VITALS — BP 180/100 | HR 63 | Temp 98.3°F | Resp 16 | Ht 70.0 in | Wt 196.0 lb

## 2015-10-13 DIAGNOSIS — Z23 Encounter for immunization: Secondary | ICD-10-CM | POA: Diagnosis not present

## 2015-10-13 DIAGNOSIS — I10 Essential (primary) hypertension: Secondary | ICD-10-CM | POA: Diagnosis not present

## 2015-10-13 NOTE — Addendum Note (Signed)
Addended by: Conception ChancyOBERSON, AMY R on: 10/13/2015 02:28 PM   Modules accepted: Orders

## 2015-10-13 NOTE — Assessment & Plan Note (Addendum)
Taking hctz 12.5 mg daily and BP elevated today. Recheck is still elevated. He will monitor at home and if elevated will increase hctz to 25 mg daily. He is not having symptoms. Checking CMP and adjust as needed.

## 2015-10-13 NOTE — Progress Notes (Signed)
   Subjective:    Patient ID: Jeff PapJohn Q Hogue Jr., male    DOB: 1929-09-07, 82 y.o.   MRN: [id number]  HPI The patient is an 82 YO man coming in for follow up of his blood pressure (taking his hctz daily without side effects, does not check BP at home, no headaches chest pains or sob, not known to be complicated). He denies new problems or concerns. No side effects from the medicine. Needs to see his eye doctor back as his vision is changed some.   Review of Systems  Constitutional: Negative for activity change, appetite change, fever and unexpected weight change.  Eyes: Negative.   Respiratory: Negative for cough, chest tightness, shortness of breath and wheezing.   Cardiovascular: Negative for chest pain, palpitations and leg swelling.  Gastrointestinal: Negative for abdominal distention, abdominal pain, constipation and diarrhea.  Musculoskeletal: Negative.   Neurological: Negative.       Objective:   Physical Exam  Constitutional: He is oriented to person, place, and time. He appears well-developed and well-nourished.  HENT:  Head: Normocephalic and atraumatic.  Eyes: EOM are normal.  Neck: No JVD present.  No carotid bruit  Cardiovascular: Normal rate and regular rhythm.   No murmur heard. Pulmonary/Chest: Effort normal and breath sounds normal. No respiratory distress. He has no wheezes.  Abdominal: Soft.  Neurological: He is alert and oriented to person, place, and time. Coordination normal.  Skin: Skin is warm and dry.   Vitals:   10/13/15 1329  BP: (!) 150/104  Pulse: 63  Resp: 16  Temp: 98.3 F (36.8 C)  TempSrc: Oral  SpO2: 96%  Weight: 196 lb (88.9 kg)  Height: 5\' 10"  (1.778 m)      Assessment & Plan:  Flu shot given at visit.

## 2015-10-13 NOTE — Patient Instructions (Signed)
We are checking the labs today.   We want you to check the blood pressure at home and call us if the numbers are running more than 150 for the top number or more than 95 for the bottom number.   If they are running high we can increase the dose of the blood pressure medicine to help.

## 2015-10-13 NOTE — Progress Notes (Signed)
Pre visit review using our clinic review tool, if applicable. No additional management support is needed unless otherwise documented below in the visit note. 

## 2015-11-06 ENCOUNTER — Other Ambulatory Visit: Payer: Self-pay | Admitting: Internal Medicine

## 2016-01-13 ENCOUNTER — Other Ambulatory Visit (INDEPENDENT_AMBULATORY_CARE_PROVIDER_SITE_OTHER): Payer: Medicare Other

## 2016-01-13 ENCOUNTER — Ambulatory Visit (INDEPENDENT_AMBULATORY_CARE_PROVIDER_SITE_OTHER): Payer: Medicare Other | Admitting: Internal Medicine

## 2016-01-13 ENCOUNTER — Encounter: Payer: Self-pay | Admitting: Internal Medicine

## 2016-01-13 DIAGNOSIS — I10 Essential (primary) hypertension: Secondary | ICD-10-CM

## 2016-01-13 LAB — CBC
HCT: 45.7 % (ref 39.0–52.0)
Hemoglobin: 15.3 g/dL (ref 13.0–17.0)
MCHC: 33.4 g/dL (ref 30.0–36.0)
MCV: 93.3 fl (ref 78.0–100.0)
Platelets: 190 10*3/uL (ref 150.0–400.0)
RBC: 4.9 Mil/uL (ref 4.22–5.81)
RDW: 13.4 % (ref 11.5–15.5)
WBC: 8.8 10*3/uL (ref 4.0–10.5)

## 2016-01-13 LAB — COMPREHENSIVE METABOLIC PANEL
ALK PHOS: 57 U/L (ref 39–117)
ALT: 21 U/L (ref 0–53)
AST: 20 U/L (ref 0–37)
Albumin: 4.2 g/dL (ref 3.5–5.2)
BUN: 20 mg/dL (ref 6–23)
CHLORIDE: 104 meq/L (ref 96–112)
CO2: 33 meq/L — AB (ref 19–32)
Calcium: 9.5 mg/dL (ref 8.4–10.5)
Creatinine, Ser: 1.31 mg/dL (ref 0.40–1.50)
GFR: 66.91 mL/min (ref >60.00)
GLUCOSE: 95 mg/dL (ref 70–99)
POTASSIUM: 4 meq/L (ref 3.5–5.1)
SODIUM: 142 meq/L (ref 135–145)
TOTAL PROTEIN: 7.4 g/dL (ref 6.0–8.3)
Total Bilirubin: 1.1 mg/dL (ref 0.2–1.2)

## 2016-01-13 LAB — LIPID PANEL
Cholesterol: 94 mg/dL (ref 0–200)
HDL: 56 mg/dL (ref >39.00)
LDL CALC: 27 mg/dL (ref 0–99)
NONHDL: 37.57
Total CHOL/HDL Ratio: 2
Triglycerides: 55 mg/dL (ref 0.0–149.0)
VLDL: 11 mg/dL (ref 0.0–40.0)

## 2016-01-13 NOTE — Progress Notes (Signed)
   Subjective:    Patient ID: Jeff PapJohn Q Lorincz Jr., Jeff Lamb    DOB: October 19, 1929, 83 y.o.   MRN: [id number]  HPI The patient is an 83 YO man coming in for follow up of his blood pressure. He had high reading at our last office visit and we asked him to monitor at home and if elevated we would increase his medication. He did not call us back and has not been monitoring his blood pressure at home. He is still taking hctz 12.5 mg daily but missed a few doses around the holidays. He was also eating more sodium around the holidays with a lot of ham.   Review of Systems  Constitutional: Negative for activity change, appetite change, fatigue, fever and unexpected weight change.  Respiratory: Negative.   Cardiovascular: Negative.   Gastrointestinal: Negative.   Musculoskeletal: Negative.   Skin: Negative.   Neurological: Negative.       Objective:   Physical Exam  Constitutional: He is oriented to person, place, and time. He appears well-developed and well-nourished.  HENT:  Head: Normocephalic and atraumatic.  Eyes: EOM are normal.  Neck: Normal range of motion.  Cardiovascular: Normal rate and regular rhythm.   Pulmonary/Chest: Effort normal and breath sounds normal. No respiratory distress. He has no wheezes. He has no rales.  Abdominal: Soft. He exhibits no distension. There is no tenderness. There is no rebound.  Neurological: He is alert and oriented to person, place, and time.  Skin: Skin is warm and dry.   Vitals:   01/13/16 1259 01/13/16 1316  BP: (!) 160/90 (!) 158/90  Pulse: (!) 57   Resp: 12   Temp: 98 F (36.7 C)   TempSrc: Oral   SpO2: 99%   Weight: 197 lb 12 oz (89.7 kg)   Height: 5\' 10"  (1.778 m)       Assessment & Plan:

## 2016-01-13 NOTE — Progress Notes (Signed)
Pre visit review using our clinic review tool, if applicable. No additional management support is needed unless otherwise documented below in the visit note. 

## 2016-01-13 NOTE — Patient Instructions (Addendum)
We will check the labs today.   Make sure to take the blood pressure medicine daily and keep up the good work.

## 2016-01-13 NOTE — Assessment & Plan Note (Addendum)
BP improved from last visit and missed several doses recently with poor diet. Will observe still on the hctz 12.5 mg daily. Did not get labs drawn last time and have asked him to get CMP drawn today.

## 2016-03-12 ENCOUNTER — Ambulatory Visit (INDEPENDENT_AMBULATORY_CARE_PROVIDER_SITE_OTHER): Payer: Medicare Other | Admitting: Internal Medicine

## 2016-03-12 ENCOUNTER — Encounter: Payer: Self-pay | Admitting: Internal Medicine

## 2016-03-12 DIAGNOSIS — L918 Other hypertrophic disorders of the skin: Secondary | ICD-10-CM | POA: Diagnosis not present

## 2016-03-12 MED ORDER — TRIAMCINOLONE ACETONIDE 0.1 % EX CREA: 1.0000 "application " | TOPICAL_CREAM | Freq: Two times a day (BID) | CUTANEOUS | 0 refills | Status: DC

## 2016-03-12 NOTE — Progress Notes (Signed)
Pre visit review using our clinic review tool, if applicable. No additional management support is needed unless otherwise documented below in the visit note. 

## 2016-03-12 NOTE — Patient Instructions (Signed)
This spot is a skin tag and is not cancerous.   We have sent in the cream for the area to help it heal up quicker.

## 2016-03-12 NOTE — Assessment & Plan Note (Signed)
Benign etiology and given reassurance. Rx for triamcinolone for the irritation from getting caught in pajamas. He does not elect for removal at this time and no indication for biopsy.

## 2016-03-12 NOTE — Progress Notes (Signed)
   Subjective:    Patient ID: Jeff PapJohn Q Falwell Jr., male    DOB: [birth year], 83 y.o.   MRN: [id number]  HPI The patient is an 83 YO man coming in for lesion on his testicles. He caught the area on his pajamas and it tore slightly which bled. He noticed it first about 2-3 years ago. It has grown slightly over that time but has not changed colors. It is not painful and no change in sexual partner. No genital lesion or sores or discharge otherwise.   Review of Systems  Constitutional: Negative.   Respiratory: Negative.   Cardiovascular: Negative.   Gastrointestinal: Negative.   Musculoskeletal: Negative.   Skin: Positive for color change.  Neurological: Negative.       Objective:   Physical Exam  Constitutional: He is oriented to person, place, and time. He appears well-developed and well-nourished.  HENT:  Head: Normocephalic and atraumatic.  Eyes: EOM are normal.  Neck: Normal range of motion.  Cardiovascular: Normal rate and regular rhythm.   Pulmonary/Chest: Effort normal and breath sounds normal.  Abdominal: Soft. Bowel sounds are normal.  Neurological: He is alert and oriented to person, place, and time.  Skin: Skin is warm and dry.  3 mm skin tag, pink in color on the left frontal scrotum, not tender to touch, circular   Vitals:   03/12/16 1434  BP: 130/80  Pulse: 66  Temp: 98.2 F (36.8 C)  TempSrc: Oral  SpO2: 98%  Weight: 198 lb (89.8 kg)  Height: 5\' 10"  (1.778 m)      Assessment & Plan:

## 2016-03-25 ENCOUNTER — Other Ambulatory Visit: Payer: Self-pay | Admitting: *Deleted

## 2016-03-25 MED ORDER — HYDROCHLOROTHIAZIDE 12.5 MG PO CAPS: ORAL_CAPSULE | ORAL | 1 refills | Status: DC

## 2016-05-13 DIAGNOSIS — H3530 Unspecified macular degeneration: Secondary | ICD-10-CM | POA: Diagnosis not present

## 2016-06-11 ENCOUNTER — Other Ambulatory Visit: Payer: Self-pay | Admitting: Internal Medicine

## 2016-07-06 ENCOUNTER — Other Ambulatory Visit: Payer: Self-pay | Admitting: Internal Medicine

## 2016-07-12 ENCOUNTER — Ambulatory Visit: Payer: Medicare Other | Admitting: Internal Medicine

## 2016-07-15 ENCOUNTER — Encounter: Payer: Self-pay | Admitting: Nurse Practitioner

## 2016-07-15 ENCOUNTER — Ambulatory Visit (INDEPENDENT_AMBULATORY_CARE_PROVIDER_SITE_OTHER): Payer: Medicare Other | Admitting: Nurse Practitioner

## 2016-07-15 VITALS — BP 156/82 | HR 56 | Temp 98.0°F | Ht 70.0 in | Wt 191.0 lb

## 2016-07-15 DIAGNOSIS — I10 Essential (primary) hypertension: Secondary | ICD-10-CM

## 2016-07-15 DIAGNOSIS — H6123 Impacted cerumen, bilateral: Secondary | ICD-10-CM | POA: Diagnosis not present

## 2016-07-15 DIAGNOSIS — N529 Male erectile dysfunction, unspecified: Secondary | ICD-10-CM

## 2016-07-15 MED ORDER — SILDENAFIL CITRATE 25 MG PO TABS: 25.0000 mg | ORAL_TABLET | Freq: Every day | ORAL | 0 refills | Status: DC | PRN

## 2016-07-15 NOTE — Progress Notes (Signed)
Subjective:  Patient ID: Jeff Pap., male    DOB: May 24, 1929  Age: 83 y.o. MRN: [id number]  CC: Follow-up (follow up--itching in on both ear)   Ear Fullness   There is pain in both ears. This is a recurrent problem. The current episode started more than 1 month ago. The problem has been waxing and waning. There has been no fever. The patient is experiencing no pain. Pertinent negatives include no abdominal pain, coughing, diarrhea, ear discharge, headaches, hearing loss, neck pain, rash, rhinorrhea, sore throat or vomiting. He has tried nothing for the symptoms. His past medical history is significant for hearing loss. There is no history of a chronic ear infection or a tympanostomy tube.   HTN: Home BP reading: 150s/80s. No acute symptoms.  ED: Also requested viagra prescription for erectile dysfunction. He has used in past with no adverse reaction.  Outpatient Medications Prior to Visit  Medication Sig Dispense Refill  . aspirin 81 MG tablet Take 81 mg by mouth daily.    [name] atorvastatin (LIPITOR) 40 MG tablet TAKE 1 TABLET (40 MG TOTAL) BY MOUTH DAILY. 30 tablet 10  . hydrochlorothiazide (MICROZIDE) 12.5 MG capsule TAKE 1 CAPSULE (12.5 MG TOTAL) BY MOUTH DAILY. 90 capsule 1  . triamcinolone cream (KENALOG) 0.1 % APPLY 1 APPLICATION TOPICALLY 2 (TWO) TIMES DAILY. 30 g 0   No facility-administered medications prior to visit.     ROS Review of Systems  HENT: Negative for ear discharge, hearing loss, rhinorrhea and sore throat.   Respiratory: Negative for cough.   Cardiovascular: Negative for chest pain, palpitations and leg swelling.  Gastrointestinal: Negative for abdominal pain, diarrhea and vomiting.  Musculoskeletal: Negative for neck pain.  Skin: Negative for rash.  Neurological: Negative for dizziness, loss of consciousness and headaches.    Procedure Note :     Procedure :  Ear irrigation   Indication:  Cerumen impaction (bilateral)   Risks, including pain,  dizziness, eardrum perforation, bleeding, infection and others as well as benefits were explained to the patient in detail. Verbal consent was obtained and the patient agreed to proceed.    We used "The Elephant Ear Irrigation Device" filled with lukewarm water for irrigation. A large amount wax was recovered. Procedure has also required manual wax removal with an ear loop.   Tolerated well. Complications: None.   Postprocedure instructions :  Call if problems.   Objective:  BP (!) 156/82   Pulse (!) 56   Temp 98 F (36.7 C)   Ht 5\' 10"  (1.778 m)   Wt 191 lb (86.6 kg)   SpO2 98%   BMI 27.41 kg/m   BP Readings from Last 3 Encounters:  07/15/16 (!) 156/82  03/12/16 130/80  01/13/16 (!) 158/90    Wt Readings from Last 3 Encounters:  07/15/16 191 lb (86.6 kg)  03/12/16 198 lb (89.8 kg)  01/13/16 197 lb 12 oz (89.7 kg)    Physical Exam  Constitutional: He is oriented to person, place, and time.  HENT:  Right Ear: Tympanic membrane, external ear and ear canal normal.  Left Ear: Tympanic membrane, external ear and ear canal normal.  Neck: Normal range of motion. Neck supple.  Cardiovascular: Normal rate, regular rhythm and normal heart sounds.   Pulmonary/Chest: Effort normal and breath sounds normal.  Musculoskeletal: He exhibits no edema.  Lymphadenopathy:    He has no cervical adenopathy.  Neurological: He is alert and oriented to person, place, and time.  Vitals reviewed.  Lab Results  Component Value Date   WBC 8.8 01/13/2016   HGB 15.3 01/13/2016   HCT 45.7 01/13/2016   PLT 190.0 01/13/2016   GLUCOSE 95 01/13/2016   CHOL 94 01/13/2016   TRIG 55.0 01/13/2016   HDL 56.00 01/13/2016   LDLCALC 27 01/13/2016   ALT 21 01/13/2016   AST 20 01/13/2016   NA 142 01/13/2016   K 4.0 01/13/2016   CL 104 01/13/2016   CREATININE 1.31 01/13/2016   BUN 20 01/13/2016   CO2 33 (H) 01/13/2016    Mr Cervical Spine Wo Contrast  Result Date: 01/04/2011 *RADIOLOGY  REPORT* Clinical Data: Neck pain and left arm pain MRI CERVICAL SPINE WITHOUT CONTRAST Technique:  Multiplanar and multiecho pulse sequences of the cervical spine, to include the craniocervical junction and cervicothoracic junction, were obtained according to standard protocol without intravenous contrast. Comparison: Cervical radiographs 12/23/2010 Findings: Image quality degraded by patient motion.  There is motion on all of the images obtained. Normal cervical alignment.  Straightening of the cervical lordosis. Negative for fracture or mass lesion.  No spinal cord lesions are identified. C2-3:  Moderate disc degeneration and spurring with mild spinal stenosis. C3-4:  Moderate disc degeneration and spondylosis.  Left foraminal encroachment due to spurring with mild spinal stenosis. C4-5:  Moderate disc degeneration and spondylosis.  Mild spinal stenosis. C5-6:  Mild to moderate disc degeneration and spondylosis. C6-7:  Disc degeneration and spondylosis with mild foraminal narrowing bilaterally. C7-T1:  Disc degeneration with spondylosis.  There is marked foraminal encroachment bilaterally due to spurring, left greater than right.  Mild to moderate spinal stenosis is present. IMPRESSION: Image quality degraded by motion. Diffuse cervical spondylosis with disc degeneration and spurring throughout the entire cervical spine.  This is most severe at C6-7 where there is foraminal encroachment, left greater than right due to spurring.  There is likely impingement of the left C8 nerve root. Original Report Authenticated By: Camelia PhenesAVID C. CLARK, M.D.   Assessment & Plan:   Jeff Lamb was seen today for follow-up.  Diagnoses and all orders for this visit:  Essential hypertension  Bilateral impacted cerumen  Erectile dysfunction, unspecified erectile dysfunction type -     sildenafil (VIAGRA) 25 MG tablet; Take 1 tablet (25 mg total) by mouth daily as needed for erectile dysfunction.   I am having Jeff Lamb start on  sildenafil. I am also having him maintain his aspirin, hydrochlorothiazide, triamcinolone cream, and atorvastatin.  Meds ordered this encounter  Medications  . sildenafil (VIAGRA) 25 MG tablet    Sig: Take 1 tablet (25 mg total) by mouth daily as needed for erectile dysfunction.    Dispense:  10 tablet    Refill:  0    Order Specific Question:   Supervising Provider    Answer:   Tresa GarterPLOTNIKOV, ALEKSEI V [[id number]]    Follow-up: Return in about 6 months (around 01/15/2017) for HTN.  Alysia Pennaharlotte Raeghan Demeter, NP

## 2016-07-15 NOTE — Patient Instructions (Addendum)
Continue current medications  Call office if BP >150/90 for more than 2days. Check BP once a day and record.  Bring BP machine to next office visit.  DASH Eating Plan DASH stands for "Dietary Approaches to Stop Hypertension." The DASH eating plan is a healthy eating plan that has been shown to reduce high blood pressure (hypertension). It may also reduce your risk for type 2 diabetes, heart disease, and stroke. The DASH eating plan may also help with weight loss. What are tips for following this plan? General guidelines  Avoid eating more than 2,300 mg (milligrams) of salt (sodium) a day. If you have hypertension, you may need to reduce your sodium intake to 1,500 mg a day.  Limit alcohol intake to no more than 1 drink a day for nonpregnant women and 2 drinks a day for men. One drink equals 12 oz of beer, 5 oz of wine, or 1 oz of hard liquor.  Work with your health care provider to maintain a healthy body weight or to lose weight. Ask what an ideal weight is for you.  Get at least 30 minutes of exercise that causes your heart to beat faster (aerobic exercise) most days of the week. Activities may include walking, swimming, or biking.  Work with your health care provider or diet and nutrition specialist (dietitian) to adjust your eating plan to your individual calorie needs. Reading food labels  Check food labels for the amount of sodium per serving. Choose foods with less than 5 percent of the Daily Value of sodium. Generally, foods with less than 300 mg of sodium per serving fit into this eating plan.  To find whole grains, look for the word "whole" as the first word in the ingredient list. Shopping  Buy products labeled as "low-sodium" or "no salt added."  Buy fresh foods. Avoid canned foods and premade or frozen meals. Cooking  Avoid adding salt when cooking. Use salt-free seasonings or herbs instead of table salt or sea salt. Check with your health care provider or pharmacist  before using salt substitutes.  Do not fry foods. Cook foods using healthy methods such as baking, boiling, grilling, and broiling instead.  Cook with heart-healthy oils, such as olive, canola, soybean, or sunflower oil. Meal planning   Eat a balanced diet that includes: ? 5 or more servings of fruits and vegetables each day. At each meal, try to fill half of your plate with fruits and vegetables. ? Up to 6-8 servings of whole grains each day. ? Less than 6 oz of lean meat, poultry, or fish each day. A 3-oz serving of meat is about the same size as a deck of cards. One egg equals 1 oz. ? 2 servings of low-fat dairy each day. ? A serving of nuts, seeds, or beans 5 times each week. ? Heart-healthy fats. Healthy fats called Omega-3 fatty acids are found in foods such as flaxseeds and coldwater fish, like sardines, salmon, and mackerel.  Limit how much you eat of the following: ? Canned or prepackaged foods. ? Food that is high in trans fat, such as fried foods. ? Food that is high in saturated fat, such as fatty meat. ? Sweets, desserts, sugary drinks, and other foods with added sugar. ? Full-fat dairy products.  Do not salt foods before eating.  Try to eat at least 2 vegetarian meals each week.  Eat more home-cooked food and less restaurant, buffet, and fast food.  When eating at a restaurant, ask that your food  be prepared with less salt or no salt, if possible. What foods are recommended? The items listed may not be a complete list. Talk with your dietitian about what dietary choices are best for you. Grains Whole-grain or whole-wheat bread. Whole-grain or whole-wheat pasta. Brown rice. Modena Morrow. Bulgur. Whole-grain and low-sodium cereals. Pita bread. Low-fat, low-sodium crackers. Whole-wheat flour tortillas. Vegetables Fresh or frozen vegetables (raw, steamed, roasted, or grilled). Low-sodium or reduced-sodium tomato and vegetable juice. Low-sodium or reduced-sodium tomato  sauce and tomato paste. Low-sodium or reduced-sodium canned vegetables. Fruits All fresh, dried, or frozen fruit. Canned fruit in natural juice (without added sugar). Meat and other protein foods Skinless chicken or [place]. Ground chicken or [place]. Pork with fat trimmed off. Fish and seafood. Egg whites. Dried beans, peas, or lentils. Unsalted nuts, nut butters, and seeds. Unsalted canned beans. Lean cuts of beef with fat trimmed off. Low-sodium, lean deli meat. Dairy Low-fat (1%) or fat-free (skim) milk. Fat-free, low-fat, or reduced-fat cheeses. Nonfat, low-sodium ricotta or cottage cheese. Low-fat or nonfat yogurt. Low-fat, low-sodium cheese. Fats and oils Soft margarine without trans fats. Vegetable oil. Low-fat, reduced-fat, or light mayonnaise and salad dressings (reduced-sodium). Canola, safflower, olive, soybean, and sunflower oils. Avocado. Seasoning and other foods Herbs. Spices. Seasoning mixes without salt. Unsalted popcorn and pretzels. Fat-free sweets. What foods are not recommended? The items listed may not be a complete list. Talk with your dietitian about what dietary choices are best for you. Grains Baked goods made with fat, such as croissants, muffins, or some breads. Dry pasta or rice meal packs. Vegetables Creamed or fried vegetables. Vegetables in a cheese sauce. Regular canned vegetables (not low-sodium or reduced-sodium). Regular canned tomato sauce and paste (not low-sodium or reduced-sodium). Regular tomato and vegetable juice (not low-sodium or reduced-sodium). Angie Fava. Olives. Fruits Canned fruit in a light or heavy syrup. Fried fruit. Fruit in cream or butter sauce. Meat and other protein foods Fatty cuts of meat. Ribs. Fried meat. Berniece Salines. Sausage. Bologna and other processed lunch meats. Salami. Fatback. Hotdogs. Bratwurst. Salted nuts and seeds. Canned beans with added salt. Canned or smoked fish. Whole eggs or egg yolks. Chicken or [place] with skin. Dairy Whole  or 2% milk, cream, and half-and-half. Whole or full-fat cream cheese. Whole-fat or sweetened yogurt. Full-fat cheese. Nondairy creamers. Whipped toppings. Processed cheese and cheese spreads. Fats and oils Butter. Stick margarine. Lard. Shortening. Ghee. Bacon fat. Tropical oils, such as coconut, palm kernel, or palm oil. Seasoning and other foods Salted popcorn and pretzels. Onion salt, garlic salt, seasoned salt, table salt, and sea salt. Worcestershire sauce. Tartar sauce. Barbecue sauce. Teriyaki sauce. Soy sauce, including reduced-sodium. Steak sauce. Canned and packaged gravies. Fish sauce. Oyster sauce. Cocktail sauce. Horseradish that you find on the shelf. Ketchup. Mustard. Meat flavorings and tenderizers. Bouillon cubes. Hot sauce and Tabasco sauce. Premade or packaged marinades. Premade or packaged taco seasonings. Relishes. Regular salad dressings. Where to find more information:  National Heart, Lung, and [place]: https://wilson-eaton.com/  American Heart Association: www.heart.org Summary  The DASH eating plan is a healthy eating plan that has been shown to reduce high blood pressure (hypertension). It may also reduce your risk for type 2 diabetes, heart disease, and stroke.  With the DASH eating plan, you should limit salt (sodium) intake to 2,300 mg a day. If you have hypertension, you may need to reduce your sodium intake to 1,500 mg a day.  When on the DASH eating plan, aim to eat more fresh fruits and vegetables, whole  grains, lean proteins, low-fat dairy, and heart-healthy fats.  Work with your health care provider or diet and nutrition specialist (dietitian) to adjust your eating plan to your individual calorie needs. This information is not intended to replace advice given to you by your health care provider. Make sure you discuss any questions you have with your health care provider. Document Released: 12/17/2010 Document Revised: 12/22/2015 Document Reviewed:  12/22/2015 Elsevier Interactive Patient Education  2017 [employer].

## 2016-07-21 ENCOUNTER — Telehealth: Payer: Self-pay | Admitting: Nurse Practitioner

## 2016-07-21 NOTE — Telephone Encounter (Signed)
Form for DMV: called left vm for pt to call back, we need to see him again to fill out the eye exam part or if he has the report for his eye exam this year (he need to bring a copy of the report so we can complete the form.

## 2016-07-22 NOTE — Telephone Encounter (Signed)
Spoke with Jeff Lamb, he will get the report from his eye doctor fax it over to our office. Waiting for the report to complete the form. Form at [employer] desk.

## 2016-07-26 NOTE — Telephone Encounter (Signed)
Left detail massage on vm stating we do not have the report from his eye doctor yet. We need this in order for us to complete the form out. Waiting for a call back or report. Form at [employer] desk.

## 2016-07-28 ENCOUNTER — Telehealth: Payer: Self-pay | Admitting: Internal Medicine

## 2016-07-28 NOTE — Telephone Encounter (Signed)
Patient called in wanting to know if our office had received a surgery clearance form from his ophthalmologist.  Please follow up in regard.

## 2016-07-29 NOTE — Telephone Encounter (Signed)
Have not received paperwork yet.

## 2016-07-29 NOTE — Telephone Encounter (Signed)
Will you check to see if this comes to may in office basket today please

## 2016-07-30 NOTE — Telephone Encounter (Signed)
Found form at [employer] desk.  Looks like we were waiting on eye exam. Have called Dr. Theron AristaPeter Dunn's office to refax.

## 2016-08-02 NOTE — Telephone Encounter (Signed)
We have received eye exam from Dr. Telford Nabunn's office.  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  Please complete as soon as possible.

## 2016-08-04 NOTE — Telephone Encounter (Signed)
Pt stated he does not need to eye part fill out (we didn't get the rest of the record for eyes exam) so form sign by Claris Gowerharlotte, gave to pt, send to scan.

## 2016-08-04 NOTE — Telephone Encounter (Signed)
Eye exam received was not completed fax.  I have asked Dr. Shea Evansunn office to fax again multiple times.  I have tried to contact patient to ask he would like to bring copy of eye exam to our office in order to complete form.  Phone rung no answer and mb full.

## 2016-09-11 ENCOUNTER — Other Ambulatory Visit: Payer: Self-pay | Admitting: Internal Medicine

## 2016-09-24 ENCOUNTER — Encounter (HOSPITAL_COMMUNITY): Payer: Self-pay

## 2016-09-24 ENCOUNTER — Emergency Department (HOSPITAL_COMMUNITY)
Admission: EM | Admit: 2016-09-24 | Discharge: 2016-09-24 | Disposition: A | Discharge status: Home / Self Care | Payer: Medicare Other | Attending: Emergency Medicine | Admitting: Emergency Medicine

## 2016-09-24 DIAGNOSIS — Y999 Unspecified external cause status: Secondary | ICD-10-CM | POA: Insufficient documentation

## 2016-09-24 DIAGNOSIS — Z79899 Other long term (current) drug therapy: Secondary | ICD-10-CM | POA: Diagnosis not present

## 2016-09-24 DIAGNOSIS — Y939 Activity, unspecified: Secondary | ICD-10-CM | POA: Diagnosis not present

## 2016-09-24 DIAGNOSIS — I10 Essential (primary) hypertension: Secondary | ICD-10-CM | POA: Diagnosis not present

## 2016-09-24 DIAGNOSIS — W268XXA Contact with other sharp object(s), not elsewhere classified, initial encounter: Secondary | ICD-10-CM | POA: Diagnosis not present

## 2016-09-24 DIAGNOSIS — Y929 Unspecified place or not applicable: Secondary | ICD-10-CM | POA: Insufficient documentation

## 2016-09-24 DIAGNOSIS — S3131XA Laceration without foreign body of scrotum and testes, initial encounter: Secondary | ICD-10-CM | POA: Diagnosis not present

## 2016-09-24 DIAGNOSIS — Z7982 Long term (current) use of aspirin: Secondary | ICD-10-CM | POA: Diagnosis not present

## 2016-09-24 DIAGNOSIS — Z87891 Personal history of nicotine dependence: Secondary | ICD-10-CM | POA: Insufficient documentation

## 2016-09-24 LAB — CBC
HEMATOCRIT: 42.9 % (ref 39.0–52.0)
Hemoglobin: 14.2 g/dL (ref 13.0–17.0)
MCH: 30.9 pg (ref 26.0–34.0)
MCHC: 33.1 g/dL (ref 30.0–36.0)
MCV: 93.3 fL (ref 78.0–100.0)
PLATELETS: 161 10*3/uL (ref 150–400)
RBC: 4.6 MIL/uL (ref 4.22–5.81)
RDW: 12.9 % (ref 11.5–15.5)
WBC: 5.2 10*3/uL (ref 4.0–10.5)

## 2016-09-24 MED ORDER — LIDOCAINE-EPINEPHRINE (PF) 2 %-1:200000 IJ SOLN
INTRAMUSCULAR | Status: AC
  Filled 2016-09-24: qty 20

## 2016-09-24 MED ORDER — TETANUS-DIPHTH-ACELL PERTUSSIS 5-2.5-18.5 LF-MCG/0.5 IM SUSP
INTRAMUSCULAR | Status: AC
  Administered 2016-09-24: 0.5 mL via INTRAMUSCULAR
  Filled 2016-09-24: qty 0.5

## 2016-09-24 MED ORDER — TETANUS-DIPHTHERIA TOXOIDS TD 5-2 LFU IM INJ
0.5000 mL | INJECTION | Freq: Once | INTRAMUSCULAR | Status: DC
  Filled 2016-09-24: qty 0.5

## 2016-09-24 NOTE — ED Provider Notes (Signed)
[facility]-EMERGENCY DEPT Provider Note   CSN: [id number] Arrival date & time: 09/24/16  1154     History   Chief Complaint Chief Complaint  Patient presents with  . Laceration    HPI Jeff Lamb. is a 83 y.o. male.  HPI Patient was trying to step over a fence with the barb on it and caught his testicles. He reports at first he thought it would be okay at home. He reports however continued to bleed a very large amount soaked his pants. He subsequently came to the emergency department for treatment. Patient ports he felt fine until he got into the waiting room and then he suddenly started to feel kind of fainting and queasy. He reports he only takes a daily aspirin and other blood thinners. Past Medical History:  Diagnosis Date  . Biceps tendonitis 12/24/2010  . 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 Hypertension     Patient Active Problem List   Diagnosis Date Noted  . Skin tag of perineum in male 03/12/2016  . Cerumen impaction 11/20/2013  . CVA (cerebral infarction) 07/12/2013  . Hyperlipidemia 07/12/2013  . DDD (degenerative disc disease), cervical 01/19/2011  . Routine health maintenance 12/24/2010  . Hypertension     Past Surgical History:  Procedure Laterality Date  . APPENDECTOMY     31  . CATARACT EXTRACTION W/ INTRAOCULAR LENS IMPLANT  October '12   left.       Home Medications    Prior to Admission medications   Medication Sig Start Date End Date Taking? Authorizing Provider  aspirin 81 MG tablet Take 81 mg by mouth daily.   Yes [provider]  atorvastatin (LIPITOR) 40 MG tablet TAKE 1 TABLET (40 MG TOTAL) BY MOUTH DAILY. 07/06/16  Yes Veryl Speak, FNP  hydrochlorothiazide (MICROZIDE) 12.5 MG capsule TAKE 1 CAPSULE (12.5 MG TOTAL) BY MOUTH DAILY. 03/25/16  Yes Myrlene Broker, MD  triamcinolone cream (KENALOG) 0.1 % APPLY 1 APPLICATION TOPICALLY 2 (TWO) TIMES DAILY. 09/14/16  Yes Myrlene Broker, MD  sildenafil (VIAGRA) 25 MG tablet Take 1 tablet (25 mg total) by mouth daily as needed for erectile dysfunction. 07/15/16   Nche, Bonna Gains, NP    Family History Family History  Problem Relation Age of Onset  . Stroke Mother   . Hypertension Mother   . Cancer Maternal Uncle        throat cancer  . Cancer Maternal Uncle        throat cancer  . Diabetes Neg Hx   . Heart disease Neg Hx     Social History Social History  Substance Use Topics  . Smoking status: Former Smoker    Packs/day: 5.00    Years: 20.00    Types: Cigars    Quit date: 12/22/1980  . Smokeless tobacco: Never Used  . Alcohol use 0.0 oz/week     Comment: ,moderate use     Allergies   Patient has no known allergies.   Review of Systems Review of Systems Constitutional: No recent fevers chills or general illness  Physical Exam Updated Vital Signs BP 128/73 (BP Location: Left Arm)   Pulse 99   Temp 97.9 F (36.6 C) (Oral)   Resp 16   Ht  (1.778 m)   Wt 81.6 kg (180 lb)   SpO2 97%   BMI 25.83 kg/m   Physical Exam  Constitutional: He is oriented to person, place,  and time. He appears well-developed and well-nourished. No distress.  HENT:  Head: Normocephalic and atraumatic.  Eyes: EOM are normal.  Cardiovascular: Normal rate and regular rhythm.   Pulmonary/Chest: Effort normal and breath sounds normal.  Abdominal: Soft. He exhibits no distension. There is no tenderness.  Genitourinary:  Genitourinary Comments: Patient has a small snag like  laceration to the scrotal sac on the left. This is a approximately 4 mm and has a briskly bleeding, there appears to be a small vessel protruding through the defect. Scrotum itself is not full or tender. Penis is normal. Garments in bed show large amount ofblood.  Musculoskeletal: Normal range of motion.  Neurological: He is alert and oriented to person, place, and time. No cranial nerve deficit. He exhibits normal muscle tone. Coordination  normal.  Skin: Skin is warm and dry.  Psychiatric: He has a normal mood and affect.     ED Treatments / Results  Labs (all labs ordered are listed, but only abnormal results are displayed) Labs Reviewed  CBC    EKG  EKG Interpretation None       Radiology No results found.  Procedures .Marland KitchenLaceration Repair Date/Time: 09/24/2016 4:05 PM Performed by: Arby Barrette Authorized by: Arby Barrette   Consent:    Consent obtained:  Verbal Anesthesia (see MAR for exact dosages):    Anesthesia method:  Local infiltration   Local anesthetic:  Lidocaine 2% WITH epi Laceration details:    Location:  Anogenital   Anogenital location:  Scrotum   Length (cm):  0.3   Depth (mm):  0.5 Repair type:    Repair type:  Complex Pre-procedure details:    Preparation:  Patient was prepped and draped in usual sterile fashion Exploration:    Limited defect created (wound extended): no     Hemostasis achieved with:  Tied off vessels and direct pressure   Contaminated: no   Treatment:    Area cleansed with:  Betadine   Amount of cleaning:  Extensive Skin repair:    Repair method:  Sutures   Suture size:  4-0   Number of sutures:  4 Approximation:    Approximation:  Close   Vermilion border: well-aligned   Post-procedure details:    Dressing:  Bulky dressing   Patient tolerance of procedure:  Tolerated well, no immediate complications Comments:     Despite very small size of the patient's laceration, it was very persistent in bleeding. Bleeding was brisk although not pulsatile,  area bleeding quite small. Initially a pursestring type suture was done around the small puncture which appeared to be a vein that had been punctured and snagged. Pursestring did not completely stop bleeding. 2 additional simple sutures  In close approximation. Still trickle of blood persisted. Single horizontal mattress suture placed to each side of the bleeding source. Bleeding had significantly slowed  however continued to have a small trickle of blood. QVar powder applied and pressure for 15 minutes. Bleeding then stopped. It was rechecked, patient was not developing any scrotal hematoma to suggest there was bleeding internal to the skin surface.   (including critical care time)  Medications Ordered in ED Medications  lidocaine-EPINEPHrine (XYLOCAINE W/EPI) 2 %-1:200000 (PF) injection (not administered)  tetanus & diphtheria toxoids (adult) (TENIVAC) injection 0.5 mL (not administered)     Initial Impression / Assessment and Plan / ED Course  I have reviewed the triage vital signs and the nursing notes.  Pertinent labs & imaging results that were available during my care of  the patient were reviewed by me and considered in my medical decision making (see chart for details).      Final Clinical Impressions(s) / ED Diagnoses   Final diagnoses:  Laceration of scrotum, initial encounter  patient presents with an extremely small defect in the scrotum that was very persistent and bleeding as outlined above. Patient did appear to have lost a significant amount of blood when examining his garments and Chuck. We did obtain a CBC which was normal. No signs of anemia. Patient takes only daily aspirin and is not anticoagulated. Ultimately with several suture types and Qvar powder bleeding was stopped. Patient is counseled on keeping scrotum elevated and wearing compression type underwear. Reexamination did not show any indication the patient was having bleeding internal to the repaired laceration with a scrotal enlargement or swelling.  New Prescriptions New Prescriptions   No medications on file     Arby Barrette, MD 09/24/16 [phone]

## 2016-09-24 NOTE — ED Notes (Signed)
ED Provider at bedside. 

## 2016-09-24 NOTE — ED Notes (Signed)
Pt jumped over metal fence moving things in prep for weather. Pt was stuck in scrotum with metal fencing. Pt has one laceration on scrutum and is actively bleeding, possibly arterial.

## 2016-09-24 NOTE — ED Notes (Signed)
ED PA at bedside

## 2016-09-24 NOTE — ED Notes (Signed)
Pt is resting at this time. Bleeding is minimizing.

## 2016-09-24 NOTE — Discharge Instructions (Signed)
1. Absorbable stitches have been used to close the cut on the scrotum.these kind of stitches do not need to be removed. They will dissolve on their own. 2. Continue to wear the compression and support underwear for at least a week. 3. See your doctor for wound recheck within the next 2-4 days.

## 2016-10-24 ENCOUNTER — Other Ambulatory Visit: Payer: Self-pay | Admitting: Internal Medicine

## 2017-03-04 ENCOUNTER — Ambulatory Visit: Payer: Self-pay

## 2017-03-04 NOTE — Telephone Encounter (Signed)
Pt called about navel swelling that has been there for "a long time>"Pt stated that the swelling goes away when he lays down. No redness + tingling. Pt states the swelling is the size of a half dollar. Pt asking for appt in March. Offered appt today and on Monday. Pt refused. Advised pt to call if pain occurs or swelling does not go away when he lays down. Appt is 2:45 March 4th with PCP.  Reason for Disposition . [1] New-onset hernia suspected (reducible bulge in groin or abdomen; non-tender) AND [2] NO pain or vomiting  Answer Assessment - Initial Assessment Questions 1. ONSET:  "When did this first appear?"     Pt could not tell me how long, just that it has been going on for a while 2. APPEARANCE: "What does it look like?"     Pt states it is a swelling. Denies redness just "a swelling" 3. SIZE: "How big is it?" (inches, cm or compare to coins, fruit)     Size of a half dollar 4. LOCATION: "Where exactly is the hernia located?"     navel 5. PATTERN: "Does the swelling come and go, or has it been constant since it started?"     It goes away when he lays down 6. PAIN: "Is there any pain?" If so, ask: "How bad is it?"  (Scale 1-10; or mild, moderate, severe)     Denies pain  7. DIAGNOSIS: "Have you been seen by a doctor for this?" "Did the doctor diagnose you as having a hernia?"     no 8. OTHER SYMPTOMS: "Do you have any other symptoms?" (e.g., fever, abdominal pain, vomiting)     no 9. PREGNANCY: "Is there any chance you are pregnant?" "When was your last menstrual period?"     n/a  Protocols used: HERNIA-A-AH

## 2017-03-14 ENCOUNTER — Ambulatory Visit (INDEPENDENT_AMBULATORY_CARE_PROVIDER_SITE_OTHER): Payer: Medicare Other | Admitting: Internal Medicine

## 2017-03-14 ENCOUNTER — Encounter: Payer: Self-pay | Admitting: Internal Medicine

## 2017-03-14 VITALS — BP 130/84 | HR 58 | Temp 97.9°F | Ht 70.0 in | Wt 190.0 lb

## 2017-03-14 DIAGNOSIS — K439 Ventral hernia without obstruction or gangrene: Secondary | ICD-10-CM

## 2017-03-14 DIAGNOSIS — H6121 Impacted cerumen, right ear: Secondary | ICD-10-CM

## 2017-03-14 DIAGNOSIS — H6122 Impacted cerumen, left ear: Secondary | ICD-10-CM

## 2017-03-14 DIAGNOSIS — Z23 Encounter for immunization: Secondary | ICD-10-CM

## 2017-03-14 NOTE — Assessment & Plan Note (Signed)
No treatment indicated. Discussed the prognosis with the patient today. He agrees. He will call back if pain or worsening. Advised to avoid cough or straining.

## 2017-03-14 NOTE — Progress Notes (Signed)
   Subjective:    Patient ID: Jeff PapJohn Q Cancelliere Jr., male    DOB: 03-16-1929, 84 y.o.   MRN: [id number]  HPI The patient is an 84 YO man coming in for mass on lower stomach. Has been there for months to years. Non-painful. Does bulge with standing or straining. It disappears when lying down. Does itch mild sometimes. Does have some constipation chronic but if he keeps diet good this is not a problem. No chronic cough. Also having some hearing changes from wax in his ears. Needs them cleaned from time to time.   Review of Systems  Constitutional: Negative.   HENT: Negative.   Eyes: Negative.   Respiratory: Negative for cough, chest tightness and shortness of breath.   Cardiovascular: Negative for chest pain, palpitations and leg swelling.  Gastrointestinal: Positive for abdominal distention. Negative for abdominal pain, constipation, diarrhea, nausea and vomiting.  Musculoskeletal: Negative.   Skin: Negative.   Neurological: Negative.       Objective:   Physical Exam  Constitutional: He is oriented to person, place, and time. He appears well-developed and well-nourished.  HENT:  Head: Normocephalic and atraumatic.  Eyes: EOM are normal.  Neck: Normal range of motion.  Cardiovascular: Normal rate and regular rhythm.  Pulmonary/Chest: Effort normal and breath sounds normal. No respiratory distress. He has no wheezes. He has no rales.  Abdominal: Soft. Bowel sounds are normal. He exhibits no distension. There is no tenderness. There is no rebound.  2 cm circular umbilical hernia easily reducible.   Musculoskeletal: He exhibits no edema.  Neurological: He is alert and oriented to person, place, and time. Coordination normal.  Skin: Skin is warm and dry.   Vitals:   03/14/17 1444  BP: (!) 150/92  Pulse: (!) 58  Temp: 97.9 F (36.6 C)  TempSrc: Oral  SpO2: 94%  Weight: 190 lb (86.2 kg)  Height: 5\' 10"  (1.778 m)      Assessment & Plan:  Flu shot given at visit

## 2017-03-14 NOTE — Assessment & Plan Note (Signed)
Irrigated today to remove wax.

## 2017-03-14 NOTE — Patient Instructions (Signed)
We have cleaned out the ears today.   You do not need to worry about the little hernia. Avoid constipation to help keep it from growing.    Ventral Hernia A ventral hernia is a bulge of tissue from inside the abdomen that pushes through a weak area of the muscles that form the front wall of the abdomen. The tissues inside the abdomen are inside a sac (peritoneum). These tissues include the small intestine, large intestine, and the fatty tissue that covers the intestines (omentum). Sometimes, the bulge that forms a hernia contains intestines. Other hernias contain only fat. Ventral hernias do not go away without surgical treatment. There are several types of ventral hernias. You may have:  A hernia at an incision site from previous abdominal surgery (incisional hernia).  A hernia just above the belly button (epigastric hernia), or at the belly button (umbilical hernia). These types of hernias can develop from heavy lifting or straining.  A hernia that comes and goes (reducible hernia). It may be visible only when you lift or strain. This type of hernia can be pushed back into the abdomen (reduced).  A hernia that traps abdominal tissue inside the hernia (incarcerated hernia). This type of hernia does not reduce.  A hernia that cuts off blood flow to the tissues inside the hernia (strangulated hernia). The tissues can start to die if this happens. This is a very painful bulge that cannot be reduced. A strangulated hernia is a medical emergency.  What are the causes? This condition is caused by abdominal tissue putting pressure on an area of weakness in the abdominal muscles. What increases the risk? The following factors may make you more likely to develop this condition:  Being male.  Being 1958 or older.  Being overweight or obese.  Having had previous abdominal surgery, especially if there was an infection after surgery.  Having had an injury to the abdominal wall.  Having had  several pregnancies.  Having a buildup of fluid inside the abdomen (ascites).  What are the signs or symptoms? The only symptom of a ventral hernia may be a painless bulge in the abdomen. A reducible hernia may be visible only when you strain, cough, or lift. Other symptoms may include:  Dull pain.  A feeling of pressure.  Signs and symptoms of a strangulated hernia may include:  Increasing pain.  Nausea and vomiting.  Pain when pressing on the hernia.  The skin over the hernia turning red or purple.  Constipation.  Blood in the stool (feces).  How is this diagnosed? This condition may be diagnosed based on:  Your symptoms.  Your medical history.  A physical exam. You may be asked to cough or strain while standing. These actions increase the pressure inside your abdomen and force the hernia through the opening in your muscles. Your health care provider may try to reduce the hernia by pressing on it.  Imaging studies, such as an ultrasound or CT scan.  How is this treated? This condition is treated with surgery. If you have a strangulated hernia, surgery is done as soon as possible. If your hernia is small and not incarcerated, you may be asked to lose some weight before surgery. Follow these instructions at home:  Follow instructions from your health care provider about eating or drinking restrictions.  If you are overweight, your health care provider may recommend that you increase your activity level and eat a healthier diet.  Do not lift anything that is heavier than  10 lb (4.5 kg).  Return to your normal activities as told by your health care provider. Ask your health care provider what activities are safe for you. You may need to avoid activities that increase pressure on your hernia.  Take over-the-counter and prescription medicines only as told by your health care provider.  Keep all follow-up visits as told by your health care provider. This is  important. Contact a health care provider if:  Your hernia gets larger.  Your hernia becomes painful. Get help right away if:  Your hernia becomes increasingly painful.  You have pain along with any of the following: ? Changes in skin color in the area of the hernia. ? Nausea. ? Vomiting. ? Fever. Summary  A ventral hernia is a bulge of tissue from inside the abdomen that pushes through a weak area of the muscles that form the front wall of the abdomen.  This condition is treated with surgery, which may be urgent depending on your hernia.  Do not lift anything that is heavier than 10 lb (4.5 kg), and follow activity instructions from your health care provider. This information is not intended to replace advice given to you by your health care provider. Make sure you discuss any questions you have with your health care provider. Document Released: 12/15/2011 Document Revised: 08/15/2015 Document Reviewed: 08/15/2015 Elsevier Interactive Patient Education  [employer].

## 2017-04-16 ENCOUNTER — Other Ambulatory Visit: Payer: Self-pay | Admitting: Internal Medicine

## 2017-06-05 ENCOUNTER — Other Ambulatory Visit: Payer: Self-pay | Admitting: Family

## 2017-06-13 DIAGNOSIS — Z961 Presence of intraocular lens: Secondary | ICD-10-CM | POA: Diagnosis not present

## 2017-06-13 DIAGNOSIS — H35033 Hypertensive retinopathy, bilateral: Secondary | ICD-10-CM | POA: Diagnosis not present

## 2017-08-30 ENCOUNTER — Other Ambulatory Visit: Payer: Self-pay | Admitting: Family

## 2017-08-31 ENCOUNTER — Other Ambulatory Visit: Payer: Self-pay | Admitting: Internal Medicine

## 2017-08-31 ENCOUNTER — Telehealth: Payer: Self-pay | Admitting: Internal Medicine

## 2017-08-31 ENCOUNTER — Other Ambulatory Visit: Payer: Self-pay

## 2017-08-31 MED ORDER — ATORVASTATIN CALCIUM 40 MG PO TABS: ORAL_TABLET | ORAL | 3 refills | Status: DC

## 2017-08-31 NOTE — Telephone Encounter (Signed)
Copied from CRM [id number]. Topic: Quick Communication - Rx Refill/Question >> Aug 31, 2017 11:18 AM Baldo DaubAlexander, Amber L wrote: Medication: atorvastatin (LIPITOR) 40 MG tablet  Has the patient contacted their pharmacy? Yes - they state they need a new RX, he only has 2 pills left (Agent: If no, request that the patient contact the pharmacy for the refill.) (Agent: If yes, when and what did the pharmacy advise?)  Preferred Pharmacy (with phone number or street name): [id number], [place] - [address] [phone] (Phone) [phone] (Fax)  Agent: Please be advised that RX refills may take up to 3 business days. We ask that you follow-up with your pharmacy.

## 2017-11-11 ENCOUNTER — Telehealth: Payer: Self-pay | Admitting: Internal Medicine

## 2017-11-11 ENCOUNTER — Other Ambulatory Visit: Payer: Self-pay | Admitting: Internal Medicine

## 2017-11-11 NOTE — Telephone Encounter (Signed)
Copied from CRM [id number]. Topic: Quick Communication - Rx Refill/Question >> Nov 11, 2017  3:31 PM Jens Som A wrote: Medication: hydrochlorothiazide (MICROZIDE) 12.5 MG capsule [[id number]]   Has the patient contacted their pharmacy? Yes (Agent: If no, request that the patient contact the pharmacy for the refill.) (Agent: If yes, when and what did the pharmacy advise?)  Preferred Pharmacy (with phone number or street name): [id number] [address] [place] [address] Phone: [phone] Fax: [phone]    Agent: Please be advised that RX refills may take up to 3 business days. We ask that you follow-up with your pharmacy.

## 2017-11-14 MED ORDER — HYDROCHLOROTHIAZIDE 12.5 MG PO CAPS: ORAL_CAPSULE | ORAL | 1 refills | Status: DC

## 2017-11-14 NOTE — Telephone Encounter (Signed)
Reviewed chart pt is up-to-date sent refills to [facility].../[facility]  

## 2017-12-23 ENCOUNTER — Other Ambulatory Visit: Payer: Self-pay | Admitting: Internal Medicine

## 2018-01-17 ENCOUNTER — Ambulatory Visit (INDEPENDENT_AMBULATORY_CARE_PROVIDER_SITE_OTHER): Payer: Medicare Other | Admitting: Internal Medicine

## 2018-01-17 ENCOUNTER — Encounter: Payer: Self-pay | Admitting: Internal Medicine

## 2018-01-17 VITALS — BP 150/82 | HR 59 | Temp 97.8°F | Ht 70.0 in | Wt 189.0 lb

## 2018-01-17 DIAGNOSIS — H6123 Impacted cerumen, bilateral: Secondary | ICD-10-CM | POA: Diagnosis not present

## 2018-01-17 DIAGNOSIS — Z23 Encounter for immunization: Secondary | ICD-10-CM | POA: Diagnosis not present

## 2018-01-17 DIAGNOSIS — N529 Male erectile dysfunction, unspecified: Secondary | ICD-10-CM

## 2018-01-17 DIAGNOSIS — M25511 Pain in right shoulder: Secondary | ICD-10-CM | POA: Insufficient documentation

## 2018-01-17 MED ORDER — HYDROCHLOROTHIAZIDE 12.5 MG PO CAPS: ORAL_CAPSULE | ORAL | 3 refills | Status: DC

## 2018-01-17 MED ORDER — SILDENAFIL CITRATE 25 MG PO TABS: 25.0000 mg | ORAL_TABLET | Freq: Every day | ORAL | 0 refills | Status: DC | PRN

## 2018-01-17 MED ORDER — ATORVASTATIN CALCIUM 40 MG PO TABS: ORAL_TABLET | ORAL | 3 refills | Status: DC

## 2018-01-17 MED ORDER — DICLOFENAC SODIUM 1 % TD GEL: 2.0000 g | Freq: Four times a day (QID) | TRANSDERMAL | 6 refills | Status: DC

## 2018-01-17 NOTE — Assessment & Plan Note (Signed)
Rx for voltaren gel for shoulder pain. Imaging not indicated today.

## 2018-01-17 NOTE — Assessment & Plan Note (Signed)
Ear lavage performed during visit and tolerated well. Hearing improved and TMs normal after procedure.

## 2018-01-17 NOTE — Progress Notes (Signed)
Performed ear lavage on both ears

## 2018-01-17 NOTE — Patient Instructions (Signed)
Try B12 over the counter for energy.   We have sent in a gel medicine called voltaren to rub on the shoulder or any joint that is hurting for pain.   We have cleaned out the ears today.    Health Maintenance After Age 83 After age 13, you are at a higher risk for certain long-term diseases and infections as well as injuries from falls. Falls are a major cause of broken bones and head injuries in people who are older than age 1. Getting regular preventive care can help to keep you healthy and well. Preventive care includes getting regular testing and making lifestyle changes as recommended by your health care provider. Talk with your health care provider about:  Which screenings and tests you should have. A screening is a test that checks for a disease when you have no symptoms.  A diet and exercise plan that is right for you. What should I know about screenings and tests to prevent falls? Screening and testing are the best ways to find a health problem early. Early diagnosis and treatment give you the best chance of managing medical conditions that are common after age 62. Certain conditions and lifestyle choices may make you more likely to have a fall. Your health care provider may recommend:  Regular vision checks. Poor vision and conditions such as cataracts can make you more likely to have a fall. If you wear glasses, make sure to get your prescription updated if your vision changes.  Medicine review. Work with your health care provider to regularly review all of the medicines you are taking, including over-the-counter medicines. Ask your health care provider about any side effects that may make you more likely to have a fall. Tell your health care provider if any medicines that you take make you feel dizzy or sleepy.  Osteoporosis screening. Osteoporosis is a condition that causes the bones to get weaker. This can make the bones weak and cause them to break more easily.  Blood pressure  screening. Blood pressure changes and medicines to control blood pressure can make you feel dizzy.  Strength and balance checks. Your health care provider may recommend certain tests to check your strength and balance while standing, walking, or changing positions.  Foot health exam. Foot pain and numbness, as well as not wearing proper footwear, can make you more likely to have a fall.  Depression screening. You may be more likely to have a fall if you have a fear of falling, feel emotionally low, or feel unable to do activities that you used to do.  Alcohol use screening. Using too much alcohol can affect your balance and may make you more likely to have a fall. What actions can I take to lower my risk of falls? General instructions  Talk with your health care provider about your risks for falling. Tell your health care provider if: ? You fall. Be sure to tell your health care provider about all falls, even ones that seem minor. ? You feel dizzy, sleepy, or off-balance.  Take over-the-counter and prescription medicines only as told by your health care provider. These include any supplements.  Eat a healthy diet and maintain a healthy weight. A healthy diet includes low-fat dairy products, low-fat (lean) meats, and fiber from whole grains, beans, and lots of fruits and vegetables. Home safety  Remove any tripping hazards, such as rugs, cords, and clutter.  Install safety equipment such as grab bars in bathrooms and safety rails on stairs.  Keep rooms and walkways well-lit. Activity   Follow a regular exercise program to stay fit. This will help you maintain your balance. Ask your health care provider what types of exercise are appropriate for you.  If you need a cane or walker, use it as recommended by your health care provider.  Wear supportive shoes that have nonskid soles. Lifestyle  Do not drink alcohol if your health care provider tells you not to drink.  If you drink  alcohol, limit how much you have: ? 0-1 drink a day for women. ? 0-2 drinks a day for men.  Be aware of how much alcohol is in your drink. In the [place] one drink equals one typical bottle of beer (12 oz), one-half glass of wine (5 oz), or one shot of hard liquor (1 oz).  Do not use any products that contain nicotine or tobacco, such as cigarettes and e-cigarettes. If you need help quitting, ask your health care provider. Summary  Having a healthy lifestyle and getting preventive care can help to protect your health and wellness after age 71.  Screening and testing are the best way to find a health problem early and help you avoid having a fall. Early diagnosis and treatment give you the best chance for managing medical conditions that are more common for people who are older than age 57.  Falls are a major cause of broken bones and head injuries in people who are older than age 56. Take precautions to prevent a fall at home.  Work with your health care provider to learn what changes you can make to improve your health and wellness and to prevent falls. This information is not intended to replace advice given to you by your health care provider. Make sure you discuss any questions you have with your health care provider. Document Released: 11/10/2016 Document Revised: 11/10/2016 Document Reviewed: 11/10/2016 Elsevier Interactive Patient Education  2019 [employer].

## 2018-01-17 NOTE — Assessment & Plan Note (Signed)
Refill viagra and talked about potential side effects.

## 2018-01-17 NOTE — Progress Notes (Signed)
   Subjective:   Patient ID: Jeff Pap., male    DOB: April 20, 1929, 85 y.o.   MRN: [id number]  HPI The patient is an 85 YO man coming in for several concerns including ears clogged (;oor hearing due to clogged feeling, denies pain, some itching, gets ears cleaned every year or so, started several weeks ago, overall worsening, tried cleaning himself which was not effective) and ED (needs refill of viagra, still working well when needed, denies side effects) and right shoulder pain (started several months ago, no injury or overuse, denies swelling, denies numbness or weakness, hurts when he moves a certain way or lifts heavy things for some time, rarely takes tylenol which helps, denies other treatment for it, overall stable since onset).   Review of Systems  Constitutional: Positive for fatigue. Negative for activity change, appetite change, chills, fever and unexpected weight change.  HENT: Positive for hearing loss. Negative for congestion, dental problem, ear discharge, ear pain, sinus pressure, sinus pain, sneezing, sore throat, tinnitus and voice change.   Eyes: Negative.   Respiratory: Negative for cough, chest tightness and shortness of breath.   Cardiovascular: Negative for chest pain, palpitations and leg swelling.  Gastrointestinal: Negative for abdominal distention, abdominal pain, constipation, diarrhea, nausea and vomiting.  Musculoskeletal: Positive for arthralgias.  Skin: Negative.   Neurological: Negative.   Psychiatric/Behavioral: Negative.     Objective:  Physical Exam Constitutional:      Appearance: He is well-developed.  HENT:     Head: Normocephalic and atraumatic.     Comments: Prior to ear lavage TMs not visible and wax obstructed canal bilaterally, after lavage TMs clear and no redness in canal patient tolerated well without pain or complication Neck:     Musculoskeletal: Normal range of motion.  Cardiovascular:     Rate and Rhythm: Normal rate and regular  rhythm.  Pulmonary:     Effort: Pulmonary effort is normal. No respiratory distress.     Breath sounds: Normal breath sounds. No wheezing or rales.  Abdominal:     General: Bowel sounds are normal. There is no distension.     Palpations: Abdomen is soft.     Tenderness: There is no abdominal tenderness. There is no rebound.  Skin:    General: Skin is warm and dry.  Neurological:     Mental Status: He is alert and oriented to person, place, and time.     Coordination: Coordination normal.     Vitals:   01/17/18 0819  BP: (!) 150/82  Pulse: (!) 59  Temp: 97.8 F (36.6 C)  TempSrc: Oral  SpO2: 92%  Weight: 189 lb (85.7 kg)  Height: 5\' 10"  (1.778 m)    Assessment & Plan:  Flu shot given at visit

## 2018-03-05 ENCOUNTER — Emergency Department (HOSPITAL_COMMUNITY)
Admission: EM | Admit: 2018-03-05 | Discharge: 2018-03-05 | Disposition: A | Discharge status: Home / Self Care | Payer: Medicare Other | Attending: Emergency Medicine | Admitting: Emergency Medicine

## 2018-03-05 ENCOUNTER — Emergency Department (HOSPITAL_COMMUNITY): Payer: Medicare Other

## 2018-03-05 DIAGNOSIS — R112 Nausea with vomiting, unspecified: Secondary | ICD-10-CM | POA: Diagnosis not present

## 2018-03-05 DIAGNOSIS — Z79899 Other long term (current) drug therapy: Secondary | ICD-10-CM | POA: Diagnosis not present

## 2018-03-05 DIAGNOSIS — R509 Fever, unspecified: Secondary | ICD-10-CM | POA: Diagnosis not present

## 2018-03-05 DIAGNOSIS — I10 Essential (primary) hypertension: Secondary | ICD-10-CM | POA: Diagnosis not present

## 2018-03-05 DIAGNOSIS — Z7982 Long term (current) use of aspirin: Secondary | ICD-10-CM | POA: Diagnosis not present

## 2018-03-05 DIAGNOSIS — R531 Weakness: Secondary | ICD-10-CM | POA: Diagnosis not present

## 2018-03-05 DIAGNOSIS — R279 Unspecified lack of coordination: Secondary | ICD-10-CM | POA: Diagnosis not present

## 2018-03-05 DIAGNOSIS — Z87891 Personal history of nicotine dependence: Secondary | ICD-10-CM | POA: Insufficient documentation

## 2018-03-05 DIAGNOSIS — R1114 Bilious vomiting: Secondary | ICD-10-CM | POA: Diagnosis not present

## 2018-03-05 DIAGNOSIS — R0902 Hypoxemia: Secondary | ICD-10-CM | POA: Diagnosis not present

## 2018-03-05 DIAGNOSIS — R110 Nausea: Secondary | ICD-10-CM | POA: Diagnosis not present

## 2018-03-05 DIAGNOSIS — Z743 Need for continuous supervision: Secondary | ICD-10-CM | POA: Diagnosis not present

## 2018-03-05 LAB — CBC WITH DIFFERENTIAL/PLATELET
ABS IMMATURE GRANULOCYTES: 0.01 10*3/uL (ref 0.00–0.07)
Abs Immature Granulocytes: 0.04 10*3/uL (ref 0.00–0.07)
BASOS PCT: 1 %
BASOS PCT: 1 %
Basophils Absolute: 0 10*3/uL (ref 0.0–0.1)
Basophils Absolute: 0.1 10*3/uL (ref 0.0–0.1)
EOS ABS: 0 10*3/uL (ref 0.0–0.5)
EOS PCT: 0 %
Eosinophils Absolute: 0.1 10*3/uL (ref 0.0–0.5)
Eosinophils Relative: 2 %
HCT: 50.5 % (ref 39.0–52.0)
HEMATOCRIT: 28 % — AB (ref 39.0–52.0)
Hemoglobin: 7.9 g/dL — ABNORMAL LOW (ref 13.0–17.0)
Hemoglobin: 15.6 g/dL (ref 13.0–17.0)
Immature Granulocytes: 0 %
Immature Granulocytes: 0 %
LYMPHS PCT: 23 %
Lymphocytes Relative: 19 %
Lymphs Abs: 0.9 10*3/uL (ref 0.7–4.0)
Lymphs Abs: 1.8 10*3/uL (ref 0.7–4.0)
MCH: 31.1 pg (ref 26.0–34.0)
MCH: 30.9 pg (ref 26.0–34.0)
MCHC: 28.2 g/dL — ABNORMAL LOW (ref 30.0–36.0)
MCHC: 30.9 g/dL (ref 30.0–36.0)
MCV: 110.2 fL — AB (ref 80.0–100.0)
MCV: 100 fL (ref 80.0–100.0)
MONO ABS: 0.3 10*3/uL (ref 0.1–1.0)
MONO ABS: 0.6 10*3/uL (ref 0.1–1.0)
MONOS PCT: 7 %
Monocytes Relative: 7 %
NEUTROS ABS: 2.6 10*3/uL (ref 1.7–7.7)
Neutro Abs: 6.6 10*3/uL (ref 1.7–7.7)
Neutrophils Relative %: 67 %
Neutrophils Relative %: 73 %
PLATELETS: 180 10*3/uL (ref 150–400)
Platelets: 84 10*3/uL — ABNORMAL LOW (ref 150–400)
RBC: 2.54 MIL/uL — ABNORMAL LOW (ref 4.22–5.81)
RBC: 5.05 MIL/uL (ref 4.22–5.81)
RDW: 12.5 % (ref 11.5–15.5)
RDW: 12.3 % (ref 11.5–15.5)
WBC: 3.9 10*3/uL — ABNORMAL LOW (ref 4.0–10.5)
WBC: 9.2 10*3/uL (ref 4.0–10.5)
nRBC: 0 % (ref 0.0–0.2)
nRBC: 0 % (ref 0.0–0.2)

## 2018-03-05 LAB — CBC
HCT: 51.4 % (ref 39.0–52.0)
Hemoglobin: 15.5 g/dL (ref 13.0–17.0)
MCH: 30.9 pg (ref 26.0–34.0)
MCHC: 30.2 g/dL (ref 30.0–36.0)
MCV: 102.4 fL — AB (ref 80.0–100.0)
PLATELETS: UNDETERMINED 10*3/uL (ref 150–400)
RBC: 5.02 MIL/uL (ref 4.22–5.81)
RDW: 12.3 % (ref 11.5–15.5)
WBC: 7 10*3/uL (ref 4.0–10.5)
nRBC: 0 % (ref 0.0–0.2)

## 2018-03-05 LAB — COMPREHENSIVE METABOLIC PANEL
ALT: 22 U/L (ref 0–44)
AST: 32 U/L (ref 15–41)
Albumin: 3.1 g/dL — ABNORMAL LOW (ref 3.5–5.0)
Alkaline Phosphatase: 48 U/L (ref 38–126)
Anion gap: 8 (ref 5–15)
BUN: 16 mg/dL (ref 8–23)
CHLORIDE: 112 mmol/L — AB (ref 98–111)
CO2: 22 mmol/L (ref 22–32)
CREATININE: 1.13 mg/dL (ref 0.61–1.24)
Calcium: 7.5 mg/dL — ABNORMAL LOW (ref 8.9–10.3)
GFR, EST NON AFRICAN AMERICAN: 59 mL/min — AB (ref >60)
Glucose, Bld: 124 mg/dL — ABNORMAL HIGH (ref 70–99)
Potassium: 4.1 mmol/L (ref 3.5–5.1)
SODIUM: 142 mmol/L (ref 135–145)
Total Bilirubin: 1.4 mg/dL — ABNORMAL HIGH (ref 0.3–1.2)
Total Protein: 5.9 g/dL — ABNORMAL LOW (ref 6.5–8.1)

## 2018-03-05 LAB — POC OCCULT BLOOD, ED: FECAL OCCULT BLD: NEGATIVE

## 2018-03-05 LAB — INFLUENZA PANEL BY PCR (TYPE A & B)
INFLBPCR: NEGATIVE
Influenza A By PCR: NEGATIVE

## 2018-03-05 LAB — LIPASE, BLOOD: LIPASE: 25 U/L (ref 11–51)

## 2018-03-05 MED ORDER — ONDANSETRON 4 MG PO TBDP: 4.0000 mg | ORAL_TABLET | Freq: Three times a day (TID) | ORAL | 0 refills | Status: DC | PRN

## 2018-03-05 MED ORDER — SODIUM CHLORIDE 0.9 % IV SOLN
INTRAVENOUS | Status: DC
  Administered 2018-03-05: 09:00:00 via INTRAVENOUS

## 2018-03-05 MED ORDER — ONDANSETRON HCL 4 MG/2ML IJ SOLN
4.0000 mg | Freq: Once | INTRAMUSCULAR | Status: AC
  Administered 2018-03-05: 4 mg via INTRAVENOUS
  Filled 2018-03-05: qty 2

## 2018-03-05 NOTE — ED Notes (Signed)
Notified Chelsea, RN that the last CBC needs to be redrawn per lab

## 2018-03-05 NOTE — ED Notes (Signed)
ED Provider at bedside. 

## 2018-03-05 NOTE — Discharge Instructions (Signed)
As discussed, your evaluation today has been largely reassuring.  But, it is important that you monitor your condition carefully, and do not hesitate to return to the ED if you develop new, or concerning changes in your condition. ? ?Otherwise, please follow-up with your physician for appropriate ongoing care. ? ?

## 2018-03-05 NOTE — ED Provider Notes (Signed)
[facility]-EMERGENCY DEPT Provider Note   CSN: [id number] Arrival date & time: 03/05/18  0813    History   Chief Complaint Chief Complaint  Patient presents with  . Fever  . Emesis    HPI Irene PapJohn Q Cominsky Jr. is a 85 y.o. male.     HPI Patient presents from home via EMS. History obtained by EMS providers and the patient himself. The presents due to concern of nausea, vomiting, weakness for 1 day. He specifically denies pain, dyspnea, though EMS providers note the patient was hypoxic, with a saturation of 89/90% in route. Patient states that he was well prior to the onset, denies memorable precipitant. Now, with no relief from anything, he presents for evaluation. EMS notes the patient was awake and alert, beyond hypoxia otherwise hemodynamically unremarkable in route. Past Medical History:  Diagnosis Date  . Biceps tendonitis 12/24/2010  . 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 Hypertension     Patient Active Problem List   Diagnosis Date Noted  . Right shoulder pain 01/17/2018  . Ventral hernia without obstruction or gangrene 03/14/2017  . Cerumen impaction 11/20/2013  . CVA (cerebral infarction) 07/12/2013  . Hyperlipidemia 07/12/2013  . Erectile dysfunction 08/02/2011  . DDD (degenerative disc disease), cervical 01/19/2011  . Routine health maintenance 12/24/2010  . Hypertension     Past Surgical History:  Procedure Laterality Date  . APPENDECTOMY     31  . CATARACT EXTRACTION W/ INTRAOCULAR LENS IMPLANT  October '12   left.        Home Medications    Prior to Admission medications   Medication Sig Start Date End Date Taking? Authorizing Provider  aspirin 81 MG tablet Take 81 mg by mouth daily.   Yes [provider]  atorvastatin (LIPITOR) 40 MG tablet TAKE 1 TABLET BY MOUTH EVERY DAY 01/17/18  Yes Myrlene Brokerrawford, Elizabeth A, MD  hydrochlorothiazide (MICROZIDE) 12.5 MG capsule TAKE 1 CAPSULE  (12.5 MG TOTAL) BY MOUTH DAILY. 01/17/18  Yes Myrlene Brokerrawford, Elizabeth A, MD  sildenafil (VIAGRA) 25 MG tablet Take 1 tablet (25 mg total) by mouth daily as needed for erectile dysfunction. 01/17/18  Yes Myrlene Brokerrawford, Elizabeth A, MD    Family History Family History  Problem Relation Age of Onset  . Stroke Mother   . Hypertension Mother   . Cancer Maternal Uncle        throat cancer  . Cancer Maternal Uncle        throat cancer  . Diabetes Neg Hx   . Heart disease Neg Hx     Social History Social History   Tobacco Use  . Smoking status: Former Smoker    Packs/day: 5.00    Years: 20.00    Pack years: 100.00    Types: Cigars    Last attempt to quit: 12/22/1980    Years since quitting: 37.2  . Smokeless tobacco: Never Used  Substance Use Topics  . Alcohol use: Yes    Comment: ,moderate use  . Drug use: No     Allergies   Patient has no known allergies.   Review of Systems Review of Systems  Constitutional:       Per HPI, otherwise negative  HENT:       Per HPI, otherwise negative  Respiratory:       Per HPI, otherwise negative  Cardiovascular:       Per HPI, otherwise negative  Gastrointestinal: Positive for nausea and  vomiting. Negative for abdominal pain.  Endocrine:       Negative aside from HPI  Genitourinary:       Neg aside from HPI   Musculoskeletal:       Per HPI, otherwise negative  Skin: Negative.   Neurological: Negative for syncope.     Physical Exam Updated Vital Signs BP (!) 152/90   Pulse (!) 55   Temp (!) 97.4 F (36.3 C) (Oral)   Resp 19   SpO2 99%   Physical Exam Vitals signs and nursing note reviewed.  Constitutional:      General: He is not in acute distress.    Appearance: He is well-developed.  HENT:     Head: Normocephalic and atraumatic.  Eyes:     Conjunctiva/sclera: Conjunctivae normal.  Cardiovascular:     Rate and Rhythm: Normal rate and regular rhythm.  Pulmonary:     Effort: Pulmonary effort is normal. No respiratory  distress.     Breath sounds: No stridor.  Abdominal:     General: There is no distension.     Tenderness: There is no abdominal tenderness. There is no guarding.  Skin:    General: Skin is warm and dry.  Neurological:     Mental Status: He is alert and oriented to person, place, and time.      ED Treatments / Results  Labs (all labs ordered are listed, but only abnormal results are displayed) Labs Reviewed  COMPREHENSIVE METABOLIC PANEL - Abnormal; Notable for the following components:      Result Value   Chloride 112 (*)    Glucose, Bld 124 (*)    Calcium 7.5 (*)    Total Protein 5.9 (*)    Albumin 3.1 (*)    Total Bilirubin 1.4 (*)    GFR calc non Af Amer 59 (*)    All other components within normal limits  CBC WITH DIFFERENTIAL/PLATELET - Abnormal; Notable for the following components:   WBC 3.9 (*)    RBC 2.54 (*)    Hemoglobin 7.9 (*)    HCT 28.0 (*)    MCV 110.2 (*)    MCHC 28.2 (*)    Platelets 84 (*)    All other components within normal limits  CBC - Abnormal; Notable for the following components:   MCV 102.4 (*)    All other components within normal limits  LIPASE, BLOOD  INFLUENZA PANEL BY PCR (TYPE A & B)  CBC WITH DIFFERENTIAL/PLATELET  URINALYSIS, ROUTINE W REFLEX MICROSCOPIC  POC OCCULT BLOOD, ED    EKG None  Radiology Dg Chest 2 View  Result Date: 03/05/2018 CLINICAL DATA:  Fever. Nausea and vomiting. Weakness. Hypertension. EXAM: CHEST - 2 VIEW COMPARISON:  None. FINDINGS: Lateral view degraded by patient arm position. Moderate mid and lower thoracic spondylosis. Midline trachea. Mild cardiomegaly. Mediastinal contours otherwise within normal limits. No pleural effusion or pneumothorax. No congestive failure. Bilateral glenohumeral joint osteoarthritis. IMPRESSION: Cardiomegaly without congestive failure. Electronically Signed   By: Jeronimo Greaves M.D.   On: 03/05/2018 09:43    Procedures Procedures (including critical care time)  Medications  Ordered in ED Medications  0.9 %  sodium chloride infusion ( Intravenous Stopped 03/05/18 1025)  ondansetron (ZOFRAN) injection 4 mg (4 mg Intravenous Given 03/05/18 0847)     Initial Impression / Assessment and Plan / ED Course  I have reviewed the triage vital signs and the nursing notes.  Pertinent labs & imaging results that were available during my care  of the patient were reviewed by me and considered in my medical decision making (see chart for details).    On repeat exam, the patient states that he feels better, has received fluid resuscitation.    12:57 PM Patient is awake, alert, in no distress, continues to state that he feels better. He has had no additional vomiting since arrival here. We discussed findings including reassuring labs. Patient did have repeat CBC due to likely lab error, and second results consistent with multiple prior studies for him, no evidence for anemia.  The patient has no leukocytosis, no fever, no pain, no complaints after receiving fluid resuscitation. Given these findings, the patient is appropriate for close outpatient follow-up, and he acknowledges importance of return precautions as well. Patient discharged in stable condition.  Final Clinical Impressions(s) / ED Diagnoses  Nausea and vomiting   Gerhard Munch, MD 03/05/18 1258

## 2018-03-05 NOTE — ED Triage Notes (Signed)
Transported by [facility] from home-- onset of fever, n/v and generalized weakness beginning this morning. EMS administered 1 gram of Tylenol PTA for fever. Patient denies any pain. AAO x 4.

## 2018-03-05 NOTE — ED Notes (Signed)
Bed: WA09 Expected date:  Expected time:  Means of arrival:  Comments: 85 yo nausea, fatigue, fever

## 2018-03-13 ENCOUNTER — Other Ambulatory Visit: Payer: Self-pay | Admitting: *Deleted

## 2018-03-13 NOTE — Patient Outreach (Signed)
[facility]) Care Management  03/13/2018  Jeff Lamb. 08-13-1929 [id number]   Subjective: Telephone call to patient's home  / mobile number, no answer, left HIPAA compliant voicemail message, and requested call back.     Objective: Per KPN (Knowledge Performance Now, point of care tool) and chart review, patient had ED visit on 03/05/2018 for fever and emesis.   Patient also has a history of hypertension, CVA (cerebral infarction), DDD (degenerative disc disease), cervical, Hyperlipidemia,Ventral hernia,  and Hearing loss of both ears (wears hearing aids).       Assessment: Received [employer] Management Referral on 2/27/ 2020.  Screening  follow up pending patient contact.      Plan: RNCM will send unsuccessful outreach  letter, [facility] pamphlet, will call patient for 2nd telephone outreach attempt within 4 business days, screening follow up, and will proceed with case closure within 10 business days if no return call.     Idrees Quam H. Gardiner Barefoot, BSN, CCM [facility] Care Management [facility] Telephonic CM Phone: [phone] Fax: [phone]

## 2018-03-14 ENCOUNTER — Other Ambulatory Visit: Payer: Self-pay

## 2018-03-14 NOTE — Patient Outreach (Signed)
[facility]) Care Management  03/14/2018  Jeff Lamb. [birth year] [id number]   Referral Date: 03/09/2018 Referral Source: [facility] referral Referral Reason: ? Need for meals on wheels and ? Medication assistance.   Outreach Attempt: spoke with with patient.  He states that he cannot talk right now.  CM advised that someone would call another time.  Patient agreeable.     Plan: Patient outreach again within 4 business days.     Jeff Leriche, RN, MSN [facility] Care Management Care Management Coordinator Direct Line [phone] Toll Free: [phone]  Fax: [phone]

## 2018-03-16 ENCOUNTER — Encounter: Payer: Self-pay | Admitting: Internal Medicine

## 2018-03-16 ENCOUNTER — Other Ambulatory Visit (INDEPENDENT_AMBULATORY_CARE_PROVIDER_SITE_OTHER): Payer: Medicare Other

## 2018-03-16 ENCOUNTER — Ambulatory Visit (INDEPENDENT_AMBULATORY_CARE_PROVIDER_SITE_OTHER): Payer: Medicare Other | Admitting: Internal Medicine

## 2018-03-16 VITALS — BP 150/92 | HR 61 | Temp 98.0°F | Ht 70.0 in | Wt 189.0 lb

## 2018-03-16 DIAGNOSIS — Z0000 Encounter for general adult medical examination without abnormal findings: Secondary | ICD-10-CM

## 2018-03-16 DIAGNOSIS — E782 Mixed hyperlipidemia: Secondary | ICD-10-CM

## 2018-03-16 DIAGNOSIS — K439 Ventral hernia without obstruction or gangrene: Secondary | ICD-10-CM

## 2018-03-16 DIAGNOSIS — I10 Essential (primary) hypertension: Secondary | ICD-10-CM | POA: Diagnosis not present

## 2018-03-16 LAB — CBC
HCT: 48 % (ref 39.0–52.0)
Hemoglobin: 15.9 g/dL (ref 13.0–17.0)
MCHC: 33.1 g/dL (ref 30.0–36.0)
MCV: 94.8 fl (ref 78.0–100.0)
Platelets: 180 10*3/uL (ref 150.0–400.0)
RBC: 5.07 Mil/uL (ref 4.22–5.81)
RDW: 13.2 % (ref 11.5–15.5)
WBC: 7.9 10*3/uL (ref 4.0–10.5)

## 2018-03-16 LAB — LIPID PANEL
Cholesterol: 87 mg/dL (ref 0–200)
HDL: 51.2 mg/dL (ref >39.00)
LDL Cholesterol: 21 mg/dL (ref 0–99)
NonHDL: 35.81
Total CHOL/HDL Ratio: 2
Triglycerides: 76 mg/dL (ref 0.0–149.0)
VLDL: 15.2 mg/dL (ref 0.0–40.0)

## 2018-03-16 MED ORDER — TRIAMTERENE-HCTZ 37.5-25 MG PO TABS: 1.0000 | ORAL_TABLET | Freq: Every day | ORAL | 3 refills | Status: DC

## 2018-03-16 NOTE — Progress Notes (Signed)
   Subjective:   Patient ID: Jeff Pap., male    DOB: 23-Aug-1929, 85 y.o.   MRN: [id number]  HPI Here for medicare wellness and physical, no new complaints. Please see A/P for status and treatment of chronic medical problems.   Diet: heart healthy Physical activity: sedentary, exercises 2-3 times per week Depression/mood screen: negative Hearing: intact to whispered voice Visual acuity: grossly normal, performs annual eye exam  ADLs: capable Fall risk: none Home safety: good Cognitive evaluation: intact to orientation, naming, recall and repetition EOL planning: adv directives discussed    Office Visit from 03/16/2018 in [facility]  PHQ-2 Total Score  0      I have personally reviewed and have noted 1. The patient's medical and social history - reviewed today no changes 2. Their use of alcohol, tobacco or illicit drugs 3. Their current medications and supplements 4. The patient's functional ability including ADL's, fall risks, home safety risks and hearing or visual impairment. 5. Diet and physical activities 6. Evidence for depression or mood disorders 7. Care team reviewed and updated (available in snapshot)   Review of Systems  Constitutional: Negative.   HENT: Negative.   Eyes: Negative.   Respiratory: Negative for cough, chest tightness and shortness of breath.   Cardiovascular: Negative for chest pain, palpitations and leg swelling.  Gastrointestinal: Negative for abdominal distention, abdominal pain, constipation, diarrhea, nausea and vomiting.  Musculoskeletal: Negative.   Skin: Negative.   Neurological: Negative.   Psychiatric/Behavioral: Negative.     Objective:  Physical Exam Constitutional:      Appearance: He is well-developed.  HENT:     Head: Normocephalic and atraumatic.  Neck:     Musculoskeletal: Normal range of motion.  Cardiovascular:     Rate and Rhythm: Normal rate and regular rhythm.  Pulmonary:     Effort:  Pulmonary effort is normal. No respiratory distress.     Breath sounds: Normal breath sounds. No wheezing or rales.  Abdominal:     General: Bowel sounds are normal. There is no distension.     Palpations: Abdomen is soft.     Tenderness: There is no abdominal tenderness. There is no rebound.  Skin:    General: Skin is warm and dry.  Neurological:     Mental Status: He is alert and oriented to person, place, and time.     Coordination: Coordination normal.     Vitals:   03/16/18 1515  BP: (!) 150/92  Pulse: 61  Temp: 98 F (36.7 C)  TempSrc: Oral  SpO2: 94%  Weight: 189 lb (85.7 kg)  Height: 5\' 10"  (1.778 m)    Assessment & Plan:

## 2018-03-16 NOTE — Patient Instructions (Signed)
We will change the dose of the blood pressure medicine hydrochlorothiazide.   We will check the cholesterol today.   Health Maintenance After Age 83 After age 20, you are at a higher risk for certain long-term diseases and infections as well as injuries from falls. Falls are a major cause of broken bones and head injuries in people who are older than age 9. Getting regular preventive care can help to keep you healthy and well. Preventive care includes getting regular testing and making lifestyle changes as recommended by your health care provider. Talk with your health care provider about:  Which screenings and tests you should have. A screening is a test that checks for a disease when you have no symptoms.  A diet and exercise plan that is right for you. What should I know about screenings and tests to prevent falls? Screening and testing are the best ways to find a health problem early. Early diagnosis and treatment give you the best chance of managing medical conditions that are common after age 32. Certain conditions and lifestyle choices may make you more likely to have a fall. Your health care provider may recommend:  Regular vision checks. Poor vision and conditions such as cataracts can make you more likely to have a fall. If you wear glasses, make sure to get your prescription updated if your vision changes.  Medicine review. Work with your health care provider to regularly review all of the medicines you are taking, including over-the-counter medicines. Ask your health care provider about any side effects that may make you more likely to have a fall. Tell your health care provider if any medicines that you take make you feel dizzy or sleepy.  Osteoporosis screening. Osteoporosis is a condition that causes the bones to get weaker. This can make the bones weak and cause them to break more easily.  Blood pressure screening. Blood pressure changes and medicines to control blood pressure  can make you feel dizzy.  Strength and balance checks. Your health care provider may recommend certain tests to check your strength and balance while standing, walking, or changing positions.  Foot health exam. Foot pain and numbness, as well as not wearing proper footwear, can make you more likely to have a fall.  Depression screening. You may be more likely to have a fall if you have a fear of falling, feel emotionally low, or feel unable to do activities that you used to do.  Alcohol use screening. Using too much alcohol can affect your balance and may make you more likely to have a fall. What actions can I take to lower my risk of falls? General instructions  Talk with your health care provider about your risks for falling. Tell your health care provider if: ? You fall. Be sure to tell your health care provider about all falls, even ones that seem minor. ? You feel dizzy, sleepy, or off-balance.  Take over-the-counter and prescription medicines only as told by your health care provider. These include any supplements.  Eat a healthy diet and maintain a healthy weight. A healthy diet includes low-fat dairy products, low-fat (lean) meats, and fiber from whole grains, beans, and lots of fruits and vegetables. Home safety  Remove any tripping hazards, such as rugs, cords, and clutter.  Install safety equipment such as grab bars in bathrooms and safety rails on stairs.  Keep rooms and walkways well-lit. Activity   Follow a regular exercise program to stay fit. This will help you maintain your  balance. Ask your health care provider what types of exercise are appropriate for you.  If you need a cane or walker, use it as recommended by your health care provider.  Wear supportive shoes that have nonskid soles. Lifestyle  Do not drink alcohol if your health care provider tells you not to drink.  If you drink alcohol, limit how much you have: ? 0-1 drink a day for women. ? 0-2 drinks  a day for men.  Be aware of how much alcohol is in your drink. In the [place] one drink equals one typical bottle of beer (12 oz), one-half glass of wine (5 oz), or one shot of hard liquor (1 oz).  Do not use any products that contain nicotine or tobacco, such as cigarettes and e-cigarettes. If you need help quitting, ask your health care provider. Summary  Having a healthy lifestyle and getting preventive care can help to protect your health and wellness after age 64.  Screening and testing are the best way to find a health problem early and help you avoid having a fall. Early diagnosis and treatment give you the best chance for managing medical conditions that are more common for people who are older than age 62.  Falls are a major cause of broken bones and head injuries in people who are older than age 37. Take precautions to prevent a fall at home.  Work with your health care provider to learn what changes you can make to improve your health and wellness and to prevent falls. This information is not intended to replace advice given to you by your health care provider. Make sure you discuss any questions you have with your health care provider. Document Released: 11/10/2016 Document Revised: 11/10/2016 Document Reviewed: 11/10/2016 Elsevier Interactive Patient Education  2019 [employer].

## 2018-03-17 ENCOUNTER — Encounter: Payer: Self-pay | Admitting: Internal Medicine

## 2018-03-17 NOTE — Assessment & Plan Note (Signed)
Flu shot up to date. Pneumonia completed. Shingrix counseled. Tetanus up to date. Colonoscopy aged out. Counseled about sun safety and mole surveillance. Counseled about the dangers of distracted driving. Given 10 year screening recommendations.

## 2018-03-17 NOTE — Assessment & Plan Note (Signed)
Stable and no pain. Reassurance given.

## 2018-03-17 NOTE — Assessment & Plan Note (Signed)
Needs lipid panel and adjust as needed his lipitor. Past stroke and goal LDL <100.

## 2018-03-17 NOTE — Assessment & Plan Note (Signed)
BP up from currently, increase hctz to 25 mg daily due to persistent BP at home 150s/90s. He has history of stroke.

## 2018-03-20 ENCOUNTER — Other Ambulatory Visit: Payer: Self-pay | Admitting: *Deleted

## 2018-03-20 NOTE — Patient Outreach (Addendum)
[facility]) Care Management  03/20/2018  Jeff Codding Jr. August 06, 1929 [id number]   Subjective: Telephone call to patient's home / mobile number, spoke with patient, and HIPAA verified.  Discussed [facility] Care Management [employer] Management follow up, patient voiced understanding, and is in agreement to follow up.  Patient states he is doing well, has follow up appointment with primary MD on 03/16/2018, appointment went well, and is in the process of ordering new blood pressure monitor through [employer].  States he will be able to monitor blood pressure at home once he obtains a new blood pressure cuff and will go to local pharmacy to obtain readings as needed. Patient states he was able to pick up medications and is feeling better overall.   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 Care Management Social Worker for Meals on Wheels.   Patient states he does not have any education material, transition of care, care coordination, disease management, disease monitoring, transportation, or pharmacy needs at this time. States he is very appreciative of the follow up and is in agreement to receive [facility] Care Management services.     Objective: Per KPN (Knowledge Performance Now, point of care tool) and chart review, patient had ED visit on 03/05/2018 for fever and emesis.   Patient also has a history of hypertension, CVA (cerebral infarction), DDD (degenerative disc disease), cervical, Hyperlipidemia,Ventral hernia,  and Hearing loss of both ears (wears hearing aids).       Assessment: Received [employer] Management Referral on 2/27/ 2020.  Referral reason: Denied Meals on Wheels in the past, too weak to pick up meds, and has been eating hamburgers for the last week.  Screening follow up completed and refered  to [facility] Care Management Social Worker for Meals on Wheels.     Plan: RNCM will send referral to [facility] Care Management Social Worker for Meals on Wheels.     Jeff Lamb H. Gardiner Barefoot, BSN, CCM [facility] Care Management [facility] Telephonic CM Phone: [phone] Fax: [phone]

## 2018-03-24 ENCOUNTER — Telehealth: Payer: Self-pay | Admitting: *Deleted

## 2018-03-24 NOTE — Patient Outreach (Signed)
[facility]) Care Management  03/24/2018  Jeff Lamb. October 26, 1929 [id number]   CSW received referral from [facility] RNCM, Darlene for Meals on Wheels. Patient states he has some arthritis in his hands, has trouble preparing meals attimes, and is requesting some assistance with meals. CSW called & spoke with patient who states that he would appreciate any resources available for food pantries and hot meals. CSW will have [employer] ([facility] in [place]) & [employer] Book (Free Food Pantries in [place]) and information on mobile meals mailed to patient. CSW will follow-up with patient in 2 weeks to ensure that he has received them and has no further CSW needs.    Lincoln Maxin, LCSW [facility]  Clinical Social Worker cell #: [phone]

## 2018-04-07 ENCOUNTER — Other Ambulatory Visit: Payer: Self-pay | Admitting: *Deleted

## 2018-04-07 NOTE — Patient Outreach (Signed)
[facility]) Care Management  04/07/2018  Jeff Lamb. 1929-09-22 [id number]   CSW attempted to reach patient to follow-up and make sure he had received the requested resources that CSW had mailed to him regarding food pantries and hot meals. CSW was able to leave a voicemail but will make 2nd attempt to reach patient in 3-4 days.    Lincoln Maxin, LCSW [facility]  Clinical Social Worker cell #: [phone]

## 2018-04-09 ENCOUNTER — Other Ambulatory Visit: Payer: Self-pay | Admitting: Internal Medicine

## 2018-04-09 DIAGNOSIS — N529 Male erectile dysfunction, unspecified: Secondary | ICD-10-CM

## 2018-04-12 ENCOUNTER — Other Ambulatory Visit: Payer: Self-pay | Admitting: *Deleted

## 2018-04-12 NOTE — Patient Outreach (Signed)
[facility]) Care Management  04/12/2018  Jeff Lamb 23-Jun-1929 [id number]   CSW called & spoke with patient to follow-up on food resources. Patient states that he has received the resources mailed to him and was very appreciative but still declined CSW assistance with getting him on the Meals on Wheels waitlist as he would prefer to cook himself at this time. CSW had included a Meals on Wheels brochure with the resources mailed to him should he change his mind. CSW closing case at this time as no further CSW needs identified.    Lincoln Maxin, LCSW [facility]  Clinical Social Worker cell #: [phone]

## 2018-04-29 ENCOUNTER — Other Ambulatory Visit: Payer: Self-pay | Admitting: Internal Medicine

## 2018-04-29 DIAGNOSIS — N529 Male erectile dysfunction, unspecified: Secondary | ICD-10-CM

## 2018-06-12 ENCOUNTER — Other Ambulatory Visit: Payer: Self-pay | Admitting: Internal Medicine

## 2018-06-12 DIAGNOSIS — N529 Male erectile dysfunction, unspecified: Secondary | ICD-10-CM

## 2018-07-12 DIAGNOSIS — H3540 Unspecified peripheral retinal degeneration: Secondary | ICD-10-CM | POA: Diagnosis not present

## 2018-08-15 ENCOUNTER — Encounter: Payer: Self-pay | Admitting: Internal Medicine

## 2018-08-15 ENCOUNTER — Ambulatory Visit (INDEPENDENT_AMBULATORY_CARE_PROVIDER_SITE_OTHER): Payer: Medicare Other | Admitting: Internal Medicine

## 2018-08-15 ENCOUNTER — Other Ambulatory Visit: Payer: Self-pay

## 2018-08-15 VITALS — BP 160/100 | HR 62 | Temp 98.7°F | Ht 70.0 in | Wt 188.0 lb

## 2018-08-15 DIAGNOSIS — H6123 Impacted cerumen, bilateral: Secondary | ICD-10-CM

## 2018-08-15 DIAGNOSIS — I10 Essential (primary) hypertension: Secondary | ICD-10-CM | POA: Diagnosis not present

## 2018-08-15 DIAGNOSIS — N529 Male erectile dysfunction, unspecified: Secondary | ICD-10-CM

## 2018-08-15 MED ORDER — SILDENAFIL CITRATE 25 MG PO TABS: ORAL_TABLET | ORAL | 11 refills | Status: DC

## 2018-08-15 NOTE — Assessment & Plan Note (Signed)
He is advised to take his maxzide when he gets home, prior BP at goal and he denies any problems with this at home.

## 2018-08-15 NOTE — Progress Notes (Signed)
Patient consent obtained. Irrigation with water and peroxide performed. Full view of tympanic membranes after procedure.  Patient tolerated procedure well.   

## 2018-08-15 NOTE — Progress Notes (Signed)
   Subjective:   Patient ID: Jeff Lamb, male    DOB: July 27, 1929, 85 y.o.   MRN: [id number]  HPI The patient is an 85 YO man coming in for several concerns including left ear hearing loss (left ear more than right, feels like it is blocked with wax, he has had this problem many times in the past, denies pain or drainage, denies sinus symptoms) and blood pressure (did not take meds this morning, denies headaches or chest pains), and tinging in left ear (happened for years, off and on, sometimes impairs his hearing and making it difficult to understand conversation, denies pain in the ear or drainage, denies sinus symptoms) and ED (needs refill on viagra, uses sometimes and works fine when he uses this, denies problems).   Review of Systems  Constitutional: Negative.   HENT: Positive for ear discharge, hearing loss and tinnitus.   Eyes: Negative.   Respiratory: Negative for cough, chest tightness and shortness of breath.   Cardiovascular: Negative for chest pain, palpitations and leg swelling.  Gastrointestinal: Negative for abdominal distention, abdominal pain, constipation, diarrhea, nausea and vomiting.  Genitourinary:       ED  Musculoskeletal: Negative.   Skin: Negative.   Neurological: Negative.   Psychiatric/Behavioral: Negative.     Objective:  Physical Exam Constitutional:      Appearance: He is well-developed.  HENT:     Head: Normocephalic and atraumatic.     Ears:     Comments:  both ears canal impacted with copious hard wax, examination post ear lavage canal is clear and no bleeding or complications noted.  Neck:     Musculoskeletal: Normal range of motion.  Cardiovascular:     Rate and Rhythm: Normal rate and regular rhythm.  Pulmonary:     Effort: Pulmonary effort is normal. No respiratory distress.     Breath sounds: Normal breath sounds. No wheezing or rales.  Abdominal:     General: Bowel sounds are normal. There is no distension.     Palpations: Abdomen is  soft.     Tenderness: There is no abdominal tenderness. There is no rebound.  Skin:    General: Skin is warm and dry.  Neurological:     Mental Status: He is alert and oriented to person, place, and time.     Coordination: Coordination normal.     Vitals:   08/15/18 0946  BP: (!) 160/100  Pulse: 62  Temp: 98.7 F (37.1 C)  TempSrc: Oral  SpO2: 94%  Weight: 188 lb (85.3 kg)  Height: 5\' 10"  (1.778 m)    Assessment & Plan:

## 2018-08-15 NOTE — Assessment & Plan Note (Signed)
Needs refill for viagra which is done today.

## 2018-08-15 NOTE — Assessment & Plan Note (Signed)
Both ears flushed today, hearing is restored and ear canal clear after disimpaction.

## 2018-11-25 ENCOUNTER — Emergency Department (HOSPITAL_COMMUNITY): Payer: Medicare Other

## 2018-11-25 ENCOUNTER — Inpatient Hospital Stay (HOSPITAL_COMMUNITY): Payer: Medicare Other

## 2018-11-25 ENCOUNTER — Encounter (HOSPITAL_COMMUNITY): Payer: Self-pay | Admitting: Emergency Medicine

## 2018-11-25 ENCOUNTER — Other Ambulatory Visit: Payer: Self-pay

## 2018-11-25 ENCOUNTER — Inpatient Hospital Stay (HOSPITAL_COMMUNITY)
Admission: EM | Admit: 2018-11-25 | Discharge: 2018-11-30 | DRG: 064 | Disposition: A | Discharge status: Skilled Nursing Facility | Payer: Medicare Other | Attending: Internal Medicine | Admitting: Internal Medicine

## 2018-11-25 DIAGNOSIS — Q211 Atrial septal defect: Secondary | ICD-10-CM | POA: Diagnosis not present

## 2018-11-25 DIAGNOSIS — R42 Dizziness and giddiness: Secondary | ICD-10-CM | POA: Diagnosis not present

## 2018-11-25 DIAGNOSIS — H5347 Heteronymous bilateral field defects: Secondary | ICD-10-CM | POA: Diagnosis present

## 2018-11-25 DIAGNOSIS — I119 Hypertensive heart disease without heart failure: Secondary | ICD-10-CM | POA: Diagnosis not present

## 2018-11-25 DIAGNOSIS — T796XXA Traumatic ischemia of muscle, initial encounter: Secondary | ICD-10-CM | POA: Diagnosis present

## 2018-11-25 DIAGNOSIS — R29810 Facial weakness: Secondary | ICD-10-CM | POA: Diagnosis not present

## 2018-11-25 DIAGNOSIS — M19072 Primary osteoarthritis, left ankle and foot: Secondary | ICD-10-CM | POA: Diagnosis not present

## 2018-11-25 DIAGNOSIS — Z8249 Family history of ischemic heart disease and other diseases of the circulatory system: Secondary | ICD-10-CM | POA: Diagnosis not present

## 2018-11-25 DIAGNOSIS — G8194 Hemiplegia, unspecified affecting left nondominant side: Secondary | ICD-10-CM | POA: Diagnosis not present

## 2018-11-25 DIAGNOSIS — R278 Other lack of coordination: Secondary | ICD-10-CM | POA: Diagnosis not present

## 2018-11-25 DIAGNOSIS — W1830XA Fall on same level, unspecified, initial encounter: Secondary | ICD-10-CM | POA: Diagnosis present

## 2018-11-25 DIAGNOSIS — I10 Essential (primary) hypertension: Secondary | ICD-10-CM | POA: Diagnosis present

## 2018-11-25 DIAGNOSIS — I639 Cerebral infarction, unspecified: Secondary | ICD-10-CM | POA: Diagnosis not present

## 2018-11-25 DIAGNOSIS — H9193 Unspecified hearing loss, bilateral: Secondary | ICD-10-CM | POA: Diagnosis present

## 2018-11-25 DIAGNOSIS — I63531 Cerebral infarction due to unspecified occlusion or stenosis of right posterior cerebral artery: Principal | ICD-10-CM | POA: Diagnosis present

## 2018-11-25 DIAGNOSIS — Z87891 Personal history of nicotine dependence: Secondary | ICD-10-CM | POA: Diagnosis not present

## 2018-11-25 DIAGNOSIS — E785 Hyperlipidemia, unspecified: Secondary | ICD-10-CM | POA: Diagnosis present

## 2018-11-25 DIAGNOSIS — Z823 Family history of stroke: Secondary | ICD-10-CM | POA: Diagnosis not present

## 2018-11-25 DIAGNOSIS — R7303 Prediabetes: Secondary | ICD-10-CM | POA: Diagnosis not present

## 2018-11-25 DIAGNOSIS — E782 Mixed hyperlipidemia: Secondary | ICD-10-CM | POA: Diagnosis not present

## 2018-11-25 DIAGNOSIS — R29898 Other symptoms and signs involving the musculoskeletal system: Secondary | ICD-10-CM

## 2018-11-25 DIAGNOSIS — I69354 Hemiplegia and hemiparesis following cerebral infarction affecting left non-dominant side: Secondary | ICD-10-CM | POA: Diagnosis not present

## 2018-11-25 DIAGNOSIS — Z9181 History of falling: Secondary | ICD-10-CM | POA: Diagnosis not present

## 2018-11-25 DIAGNOSIS — Y92009 Unspecified place in unspecified non-institutional (private) residence as the place of occurrence of the external cause: Secondary | ICD-10-CM | POA: Diagnosis not present

## 2018-11-25 DIAGNOSIS — Z743 Need for continuous supervision: Secondary | ICD-10-CM | POA: Diagnosis not present

## 2018-11-25 DIAGNOSIS — G459 Transient cerebral ischemic attack, unspecified: Secondary | ICD-10-CM | POA: Diagnosis not present

## 2018-11-25 DIAGNOSIS — Z7982 Long term (current) use of aspirin: Secondary | ICD-10-CM

## 2018-11-25 DIAGNOSIS — K5900 Constipation, unspecified: Secondary | ICD-10-CM | POA: Diagnosis not present

## 2018-11-25 DIAGNOSIS — Z20828 Contact with and (suspected) exposure to other viral communicable diseases: Secondary | ICD-10-CM | POA: Diagnosis present

## 2018-11-25 DIAGNOSIS — Z86711 Personal history of pulmonary embolism: Secondary | ICD-10-CM | POA: Diagnosis present

## 2018-11-25 DIAGNOSIS — I69318 Other symptoms and signs involving cognitive functions following cerebral infarction: Secondary | ICD-10-CM | POA: Diagnosis not present

## 2018-11-25 DIAGNOSIS — R918 Other nonspecific abnormal finding of lung field: Secondary | ICD-10-CM | POA: Diagnosis not present

## 2018-11-25 DIAGNOSIS — Z7401 Bed confinement status: Secondary | ICD-10-CM | POA: Diagnosis not present

## 2018-11-25 DIAGNOSIS — I69352 Hemiplegia and hemiparesis following cerebral infarction affecting left dominant side: Secondary | ICD-10-CM | POA: Diagnosis not present

## 2018-11-25 DIAGNOSIS — H04123 Dry eye syndrome of bilateral lacrimal glands: Secondary | ICD-10-CM | POA: Diagnosis not present

## 2018-11-25 DIAGNOSIS — M6281 Muscle weakness (generalized): Secondary | ICD-10-CM | POA: Diagnosis not present

## 2018-11-25 DIAGNOSIS — Z79899 Other long term (current) drug therapy: Secondary | ICD-10-CM | POA: Diagnosis not present

## 2018-11-25 DIAGNOSIS — R2681 Unsteadiness on feet: Secondary | ICD-10-CM | POA: Diagnosis not present

## 2018-11-25 DIAGNOSIS — I2699 Other pulmonary embolism without acute cor pulmonale: Secondary | ICD-10-CM | POA: Diagnosis present

## 2018-11-25 DIAGNOSIS — Z8673 Personal history of transient ischemic attack (TIA), and cerebral infarction without residual deficits: Secondary | ICD-10-CM

## 2018-11-25 DIAGNOSIS — R531 Weakness: Secondary | ICD-10-CM | POA: Diagnosis not present

## 2018-11-25 DIAGNOSIS — D72829 Elevated white blood cell count, unspecified: Secondary | ICD-10-CM | POA: Diagnosis present

## 2018-11-25 DIAGNOSIS — M6282 Rhabdomyolysis: Secondary | ICD-10-CM

## 2018-11-25 DIAGNOSIS — M25572 Pain in left ankle and joints of left foot: Secondary | ICD-10-CM

## 2018-11-25 DIAGNOSIS — Z7901 Long term (current) use of anticoagulants: Secondary | ICD-10-CM | POA: Diagnosis not present

## 2018-11-25 DIAGNOSIS — M2550 Pain in unspecified joint: Secondary | ICD-10-CM | POA: Diagnosis not present

## 2018-11-25 DIAGNOSIS — I491 Atrial premature depolarization: Secondary | ICD-10-CM | POA: Diagnosis not present

## 2018-11-25 DIAGNOSIS — W19XXXA Unspecified fall, initial encounter: Secondary | ICD-10-CM

## 2018-11-25 DIAGNOSIS — R0902 Hypoxemia: Secondary | ICD-10-CM | POA: Diagnosis not present

## 2018-11-25 DIAGNOSIS — R109 Unspecified abdominal pain: Secondary | ICD-10-CM | POA: Diagnosis not present

## 2018-11-25 DIAGNOSIS — I361 Nonrheumatic tricuspid (valve) insufficiency: Secondary | ICD-10-CM | POA: Diagnosis not present

## 2018-11-25 DIAGNOSIS — S6292XD Unspecified fracture of left wrist and hand, subsequent encounter for fracture with routine healing: Secondary | ICD-10-CM | POA: Diagnosis not present

## 2018-11-25 LAB — CBC
HCT: 53.8 % — ABNORMAL HIGH (ref 39.0–52.0)
Hemoglobin: 17 g/dL (ref 13.0–17.0)
MCH: 31.4 pg (ref 26.0–34.0)
MCHC: 31.6 g/dL (ref 30.0–36.0)
MCV: 99.4 fL (ref 80.0–100.0)
Platelets: 184 10*3/uL (ref 150–400)
RBC: 5.41 MIL/uL (ref 4.22–5.81)
RDW: 12.9 % (ref 11.5–15.5)
WBC: 11.2 10*3/uL — ABNORMAL HIGH (ref 4.0–10.5)
nRBC: 0 % (ref 0.0–0.2)

## 2018-11-25 LAB — COMPREHENSIVE METABOLIC PANEL
ALT: 26 U/L (ref 0–44)
AST: 48 U/L — ABNORMAL HIGH (ref 15–41)
Albumin: 4 g/dL (ref 3.5–5.0)
Alkaline Phosphatase: 69 U/L (ref 38–126)
Anion gap: 15 (ref 5–15)
BUN: 13 mg/dL (ref 8–23)
CO2: 24 mmol/L (ref 22–32)
Calcium: 9.3 mg/dL (ref 8.9–10.3)
Chloride: 102 mmol/L (ref 98–111)
Creatinine, Ser: 1.24 mg/dL (ref 0.61–1.24)
GFR calc Af Amer: >60 mL/min (ref >60)
GFR calc non Af Amer: 52 mL/min — ABNORMAL LOW (ref >60)
Glucose, Bld: 109 mg/dL — ABNORMAL HIGH (ref 70–99)
Potassium: 3.8 mmol/L (ref 3.5–5.1)
Sodium: 141 mmol/L (ref 135–145)
Total Bilirubin: 1.6 mg/dL — ABNORMAL HIGH (ref 0.3–1.2)
Total Protein: 7.9 g/dL (ref 6.5–8.1)

## 2018-11-25 LAB — URINALYSIS, ROUTINE W REFLEX MICROSCOPIC
Bacteria, UA: NONE SEEN
Bilirubin Urine: NEGATIVE
Glucose, UA: NEGATIVE mg/dL
Ketones, ur: NEGATIVE mg/dL
Leukocytes,Ua: NEGATIVE
Nitrite: NEGATIVE
Protein, ur: 100 mg/dL — AB
Specific Gravity, Urine: 1.015 (ref 1.005–1.030)
pH: 6 (ref 5.0–8.0)

## 2018-11-25 LAB — I-STAT CHEM 8, ED
BUN: 18 mg/dL (ref 8–23)
Calcium, Ion: 1.06 mmol/L — ABNORMAL LOW (ref 1.15–1.40)
Chloride: 104 mmol/L (ref 98–111)
Creatinine, Ser: 1.1 mg/dL (ref 0.61–1.24)
Glucose, Bld: 107 mg/dL — ABNORMAL HIGH (ref 70–99)
HCT: 51 % (ref 39.0–52.0)
Hemoglobin: 17.3 g/dL — ABNORMAL HIGH (ref 13.0–17.0)
Potassium: 4.4 mmol/L (ref 3.5–5.1)
Sodium: 142 mmol/L (ref 135–145)
TCO2: 27 mmol/L (ref 22–32)

## 2018-11-25 LAB — RAPID URINE DRUG SCREEN, HOSP PERFORMED
Amphetamines: NOT DETECTED
Barbiturates: NOT DETECTED
Benzodiazepines: NOT DETECTED
Cocaine: NOT DETECTED
Opiates: NOT DETECTED
Tetrahydrocannabinol: NOT DETECTED

## 2018-11-25 LAB — DIFFERENTIAL
Abs Immature Granulocytes: 0.03 10*3/uL (ref 0.00–0.07)
Basophils Absolute: 0 10*3/uL (ref 0.0–0.1)
Basophils Relative: 0 %
Eosinophils Absolute: 0 10*3/uL (ref 0.0–0.5)
Eosinophils Relative: 0 %
Immature Granulocytes: 0 %
Lymphocytes Relative: 10 %
Lymphs Abs: 1.2 10*3/uL (ref 0.7–4.0)
Monocytes Absolute: 0.7 10*3/uL (ref 0.1–1.0)
Monocytes Relative: 6 %
Neutro Abs: 9.3 10*3/uL — ABNORMAL HIGH (ref 1.7–7.7)
Neutrophils Relative %: 84 %

## 2018-11-25 LAB — CK: Total CK: 1650 U/L — ABNORMAL HIGH (ref 49–397)

## 2018-11-25 LAB — SARS CORONAVIRUS 2 (TAT 6-24 HRS): SARS Coronavirus 2: NEGATIVE

## 2018-11-25 LAB — ETHANOL: Alcohol, Ethyl (B): <10 mg/dL (ref <10)

## 2018-11-25 MED ORDER — ACETAMINOPHEN 160 MG/5ML PO SOLN: 650.0000 mg | ORAL | Status: DC | PRN

## 2018-11-25 MED ORDER — HEPARIN (PORCINE) 25000 UT/250ML-% IV SOLN
1200.0000 [IU]/h | INTRAVENOUS | Status: DC
  Administered 2018-11-25: 1200 [IU]/h via INTRAVENOUS
  Filled 2018-11-25 (×3): qty 250

## 2018-11-25 MED ORDER — ASPIRIN 300 MG RE SUPP: 300.0000 mg | Freq: Every day | RECTAL | Status: DC

## 2018-11-25 MED ORDER — SODIUM CHLORIDE 0.9 % IV SOLN
INTRAVENOUS | Status: DC
  Administered 2018-11-25: 22:00:00 via INTRAVENOUS

## 2018-11-25 MED ORDER — SODIUM CHLORIDE 0.9 % IV BOLUS
1000.0000 mL | Freq: Once | INTRAVENOUS | Status: AC
  Administered 2018-11-25: 1000 mL via INTRAVENOUS

## 2018-11-25 MED ORDER — ASPIRIN 325 MG PO TABS
325.0000 mg | ORAL_TABLET | Freq: Every day | ORAL | Status: DC
  Administered 2018-11-25: 22:00:00 325 mg via ORAL
  Filled 2018-11-25: qty 1

## 2018-11-25 MED ORDER — ACETAMINOPHEN 325 MG PO TABS
650.0000 mg | ORAL_TABLET | ORAL | Status: DC | PRN
  Filled 2018-11-25: qty 2

## 2018-11-25 MED ORDER — IOHEXOL 350 MG/ML SOLN
100.0000 mL | Freq: Once | INTRAVENOUS | Status: AC | PRN
  Administered 2018-11-25: 100 mL via INTRAVENOUS

## 2018-11-25 MED ORDER — STROKE: EARLY STAGES OF RECOVERY BOOK
Freq: Once | Status: DC
  Filled 2018-11-25: qty 1

## 2018-11-25 MED ORDER — ACETAMINOPHEN 650 MG RE SUPP: 650.0000 mg | RECTAL | Status: DC | PRN

## 2018-11-25 MED ORDER — ENOXAPARIN SODIUM 40 MG/0.4ML ~~LOC~~ SOLN: 40.0000 mg | SUBCUTANEOUS | Status: DC

## 2018-11-25 MED ORDER — SENNOSIDES-DOCUSATE SODIUM 8.6-50 MG PO TABS: 1.0000 | ORAL_TABLET | Freq: Every evening | ORAL | Status: DC | PRN

## 2018-11-25 NOTE — H&P (Signed)
History and Physical   Jeff CoddingJohn Q Lamb VOZ:[id number] DOB: [birth year] DOA: 11/25/2018  Referring MD/NP/PA: Dr Clarene DukeLittle  PCP: Myrlene Brokerrawford, Elizabeth A, MD   Outpatient Specialists: None   Patient coming from: Home  Chief Complaint: Fall  HPI: Jeff CoddingJohn Q Lamb is a 85 y.o. male with medical history significant of CVA, HTN, Hyperlipidemia and Ventral Hernia who was found down at home suspected CVA for unknown period of time. He apparently fell and was unable to get up. Was overheard shouting by neighbours who called 911. He was brought to the ER outside the window for Code Stroke. Patient has left sided weakness with head CT suggestive of evolving lesion in the Right PCA territory. Neurology consulted and patient being admitted for CVA workup.  Patient has bilateral hearing loss as well as generalized weakness.  He is reporting bilateral lower extremity weakness also and some dizziness.  Otherwise no pain.  No other injuries observed.  So far imaging did not show any other bony injury except for the possible CVA..  ED Course: Temperature is 99 blood pressure 181/115 pulse 73 respirate of 22 oxygen sats 92% on room air.  He has a white count 11.2 hemoglobin 17.3 otherwise CBC and chemistry appear to be within normal.  Urinalysis essentially negative urine drug screen so far negative.  Without contrast showed focal hypodensity of the posterolateral right thalamus and adjacent cerebral pedicle consistent with acute to subacute PCA territory infarct.  CT angiogram of the head and neck showed no large vessel occlusion.  Chest x-ray showed cardiomegaly with vascular congestion with some atelectasis.  Neurology is consulted and patient is being admitted for CVA work-up.  Review of Systems: As per HPI otherwise 10 point review of systems negative.    Past Medical History:  Diagnosis Date  . Biceps tendonitis 12/24/2010  . 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 Hypertension      Past Surgical History:  Procedure Laterality Date  . APPENDECTOMY     31  . CATARACT EXTRACTION W/ INTRAOCULAR LENS IMPLANT  October '12   left.     reports that he quit smoking about 37 years ago. His smoking use included cigars. He has a 100.00 pack-year smoking history. He has never used smokeless tobacco. He reports current alcohol use. He reports that he does not use drugs.  No Known Allergies  Family History  Problem Relation Age of Onset  . Stroke Mother   . Hypertension Mother   . Cancer Maternal Uncle        throat cancer  . Cancer Maternal Uncle        throat cancer  . Diabetes Neg Hx   . Heart disease Neg Hx      Prior to Admission medications   Medication Sig Start Date End Date Taking? Authorizing Provider  aspirin EC 81 MG tablet Take 81 mg by mouth every morning.   Yes [provider]  atorvastatin (LIPITOR) 40 MG tablet TAKE 1 TABLET BY MOUTH EVERY DAY Patient taking differently: Take 40 mg by mouth every morning.  01/17/18  Yes Myrlene Brokerrawford, Elizabeth A, MD  hydrochlorothiazide (MICROZIDE) 12.5 MG capsule Take 12.5 mg by mouth every morning. 11/12/18  Yes [provider]  Multiple Vitamin (MULTIVITAMIN WITH MINERALS) TABS tablet Take 1 tablet by mouth every morning.   Yes [provider]  Naphazoline HCl (CLEAR EYES OP) Place 1 drop into both eyes daily as needed (itching/dry eyes).  Yes [provider]  sildenafil (VIAGRA) 25 MG tablet TAKE 1 TABLET BY MOUTH EVERY DAY AS NEEDED FOR ERECTILE DYSFUNCTION Patient taking differently: Take 25 mg by mouth daily as needed for erectile dysfunction.  08/15/18  Yes Myrlene Broker, MD  ondansetron (ZOFRAN ODT) 4 MG disintegrating tablet Take 1 tablet (4 mg total) by mouth every 8 (eight) hours as needed for nausea or vomiting. Patient not taking: Reported on 11/25/2018 03/05/18   Gerhard Munch, MD  triamterene-hydrochlorothiazide (MAXZIDE-25) 37.5-25 MG tablet Take 1 tablet by mouth  daily. Patient not taking: Reported on 11/25/2018 03/16/18   Myrlene Broker, MD    Physical Exam: Vitals:   11/25/18 1900 11/25/18 1915 11/25/18 1930 11/25/18 1945  BP: (!) 151/84 (!) 164/84 (!) 155/92 (!) 156/86  Pulse: 72 68  68  Resp: 13 15 (!) 22 14  Temp:      TempSrc:      SpO2: 97% 92%  97%      Constitutional: NAD, calm, hard of hearing Vitals:   11/25/18 1900 11/25/18 1915 11/25/18 1930 11/25/18 1945  BP: (!) 151/84 (!) 164/84 (!) 155/92 (!) 156/86  Pulse: 72 68  68  Resp: 13 15 (!) 22 14  Temp:      TempSrc:      SpO2: 97% 92%  97%   Eyes: PERRL, lids and conjunctivae normal ENMT: Mucous membranes are moist. Posterior pharynx clear of any exudate or lesions.Normal dentition.  Neck: normal, supple, no masses, no thyromegaly Respiratory: clear to auscultation bilaterally, no wheezing, no crackles. Normal respiratory effort. No accessory muscle use.  Cardiovascular: Regular rate and rhythm, no murmurs / rubs / gallops. No extremity edema. 2+ pedal pulses. No carotid bruits.  Abdomen: no tenderness, no masses palpated. No hepatosplenomegaly. Bowel sounds positive.  Musculoskeletal: no clubbing / cyanosis. No joint deformity upper and lower extremities. Good hard of hearing, no acute distress no contractures. Normal muscle tone.  Skin: no rashes, lesions, ulcers. No induration Neurologic: CN 2-12 grossly intact. Sensation intact, DTR normal.  Power 4 out of 5 left upper extremity, poor coordination psychiatric: Normal judgment and insight. Alert and oriented x 3. Normal mood.     Labs on Admission: I have personally reviewed following labs and imaging studies  CBC: Recent Labs  Lab 11/25/18 1716 11/25/18 1750  WBC 11.2*  --   NEUTROABS 9.3*  --   HGB 17.0 17.3*  HCT 53.8* 51.0  MCV 99.4  --   PLT 184  --    Basic Metabolic Panel: Recent Labs  Lab 11/25/18 1716 11/25/18 1750  NA 141 142  K 3.8 4.4  CL 102 104  CO2 24  --   GLUCOSE 109* 107*   BUN 13 18  CREATININE 1.24 1.10  CALCIUM 9.3  --    GFR: CrCl cannot be calculated (Unknown ideal weight.). Liver Function Tests: Recent Labs  Lab 11/25/18 1716  AST 48*  ALT 26  ALKPHOS 69  BILITOT 1.6*  PROT 7.9  ALBUMIN 4.0   No results for input(s): LIPASE, AMYLASE in the last 168 hours. No results for input(s): AMMONIA in the last 168 hours. Coagulation Profile: No results for input(s): INR, PROTIME in the last 168 hours. Cardiac Enzymes: Recent Labs  Lab 11/25/18 1716  CKTOTAL 1,650*   BNP (last 3 results) No results for input(s): PROBNP in the last 8760 hours. HbA1C: No results for input(s): HGBA1C in the last 72 hours. CBG: No results for input(s): GLUCAP in the last  168 hours. Lipid Profile: No results for input(s): CHOL, HDL, LDLCALC, TRIG, CHOLHDL, LDLDIRECT in the last 72 hours. Thyroid Function Tests: No results for input(s): TSH, T4TOTAL, FREET4, T3FREE, THYROIDAB in the last 72 hours. Anemia Panel: No results for input(s): VITAMINB12, FOLATE, FERRITIN, TIBC, IRON, RETICCTPCT in the last 72 hours. Urine analysis:    Component Value Date/Time   COLORURINE YELLOW 11/25/2018 1812   APPEARANCEUR CLEAR 11/25/2018 1812   LABSPEC 1.015 11/25/2018 1812   PHURINE 6.0 11/25/2018 1812   GLUCOSEU NEGATIVE 11/25/2018 1812   HGBUR LARGE (A) 11/25/2018 1812   BILIRUBINUR NEGATIVE 11/25/2018 1812   KETONESUR NEGATIVE 11/25/2018 1812   PROTEINUR 100 (A) 11/25/2018 1812   NITRITE NEGATIVE 11/25/2018 1812   LEUKOCYTESUR NEGATIVE 11/25/2018 1812   Sepsis Labs: @LABRCNTIP (procalcitonin:4,lacticidven:4) )No results found for this or any previous visit (from the past 240 hour(s)).   Radiological Exams on Admission: Ct Head Wo Contrast  Result Date: 11/25/2018 CLINICAL DATA:  Left arm weakness EXAM: CT HEAD WITHOUT CONTRAST TECHNIQUE: Contiguous axial images were obtained from the base of the skull through the vertex without intravenous contrast. COMPARISON:   None. FINDINGS: Brain: There is focal hypodensity of the posterolateral right thalamus and adjacent cerebral pedicle (series 3, image 17) in addition to the inferior right occipital lobe (series 3, image 12, series 5, image 50). There is extensive underlying periventricular and deep white matter hypodensity. Nonacute left PCA territory occipital encephalomalacia (series 3, image 16). Vascular: No hyperdense vessel or unexpected calcification. Skull: Normal. Negative for fracture or focal lesion. Sinuses/Orbits: No acute finding. Other: None. IMPRESSION: 1. There is focal hypodensity of the posterolateral right thalamus and adjacent cerebral pedicle (series 3, image 17) in addition to the inferior right occipital lobe (series 3, image 12, series 5, image 50). Findings are suspicious for acute to subacute edematous PCA territory infarction. MRI may be used to more sensitively assess for acute diffusion restricting infarction if desired. 2. Underlying small-vessel white matter disease and nonacute left PCA territory encephalomalacia. Electronically Signed   By: Eddie Candle M.D.   On: 11/25/2018 18:12   Dg Chest Portable 1 View  Result Date: 11/25/2018 CLINICAL DATA:  Cough, hypoxia EXAM: PORTABLE CHEST 1 VIEW COMPARISON:  03/05/2018 FINDINGS: Cardiomegaly, vascular congestion. Right lung clear. Left lower lobe opacity, likely atelectasis. No overt edema or visible effusions. No acute bony abnormality. IMPRESSION: Cardiomegaly, vascular congestion. Left base opacity, favor atelectasis. Electronically Signed   By: Rolm Baptise M.D.   On: 11/25/2018 19:38    EKG: Independently reviewed.  Shows normal sinus rhythm with a left anterior fascicular block.  No specific ST changes.  Rate of 76  Assessment/Plan Principal Problem:   Acute cerebrovascular accident (CVA) ([place]) Active Problems:   Hypertension   Hyperlipidemia     #1 acute to subacute right PCA territory CVA: Based on CT and clinical findings.   Patient has lives lower extremity weakness.  Power is 4 out of 5 there.  At this point will admit the patient.  Get MRI of the brain.  Carotid Dopplers and echocardiogram as well.  Neurology to follow.  Aspirin and statin to be started once he passes swallow evaluation.  #2 hypertension: Possibly permissive hypertension.  Defer decision to neurology.  #3 hyperlipidemia: Continue high intensity statins  #4 bilateral hearing loss: This is chronic.  Continue monitoring.  #5 elevated CPK: CPK of 1650.  Most likely due to fall and mild rhabdo.  Continue to hydrate   DVT prophylaxis: Lovenox Code Status:  Full code Family Communication: No family at bedside Disposition Plan: To be determined Consults called: Neurology Dr. Amada Jupiter Admission status: Inpatient  Severity of Illness: The appropriate patient status for this patient is INPATIENT. Inpatient status is judged to be reasonable and necessary in order to provide the required intensity of service to ensure the patient's safety. The patient's presenting symptoms, physical exam findings, and initial radiographic and laboratory data in the context of their chronic comorbidities is felt to place them at high risk for further clinical deterioration. Furthermore, it is not anticipated that the patient will be medically stable for discharge from the hospital within 2 midnights of admission. The following factors support the patient status of inpatient.   " The patient's presenting symptoms include fall with left-sided weakness. " The worrisome physical exam findings include left upper extremity weakness. " The initial radiographic and laboratory data are worrisome because of head CT is suspicious for CVA. " The chronic co-morbidities include hypertension hyperlipidemia and previous CVA.   * I certify that at the point of admission it is my clinical judgment that the patient will require inpatient hospital care spanning beyond 2 midnights from the  point of admission due to high intensity of service, high risk for further deterioration and high frequency of surveillance required.Lonia Blood MD [facility] Pager [phone]  If 7PM-7AM, please contact night-coverage [email or web address] Password [facility]  11/25/2018, 8:49 PM

## 2018-11-25 NOTE — ED Notes (Signed)
Patient transported to CT 

## 2018-11-25 NOTE — ED Notes (Signed)
Pt. Requested to contact Sharlyne Cai at [facility] [phone]). Number is good for cancer center. However, contacted [facility] and was advised by call center that no one works there by that name.

## 2018-11-25 NOTE — ED Provider Notes (Signed)
[facility] EMERGENCY DEPARTMENT Provider Note   CSN: [id number] Arrival date & time: 11/25/18  1646     History   Chief Complaint Chief Complaint  Patient presents with   Fall   Dizziness    HPI Jeff Lamb is a 85 y.o. male.     85 year old male with past medical history including hypertension, stroke, hearing loss who presents with fall.  Patient reports to EMS that he woke up this morning around 9 AM feeling dizzy and not right.  Later this afternoon, neighbors heard him yelling from his home and they called EMS.  EMS found him lying on the ground in feces.  Unknown downtime.  They noted left arm weakness on their exam and he reported bilateral leg weakness.  Patient denies any focal areas of pain.  He reports some cough and shortness of breath for the past 1 month, no associated fevers, vomiting, or chest pain.  The history is provided by the patient.    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        Home Medications    Prior to Admission medications   Medication Sig Start Date End Date Taking? Authorizing Provider  aspirin 81 MG tablet Take 81 mg by mouth daily.    [provider]  atorvastatin (LIPITOR) 40 MG tablet TAKE 1 TABLET BY MOUTH EVERY DAY 01/17/18   Hoyt Koch, MD  ondansetron (ZOFRAN ODT) 4 MG  disintegrating tablet Take 1 tablet (4 mg total) by mouth every 8 (eight) hours as needed for nausea or vomiting. 03/05/18   Carmin Muskrat, MD  sildenafil (VIAGRA) 25 MG tablet TAKE 1 TABLET BY MOUTH EVERY DAY AS NEEDED FOR ERECTILE DYSFUNCTION 08/15/18   Hoyt Koch, MD  triamterene-hydrochlorothiazide (MAXZIDE-25) 37.5-25 MG tablet Take 1 tablet by mouth daily. 03/16/18   Hoyt Koch, MD    Family History Family History  Problem Relation Age of Onset   Stroke Mother    Hypertension Mother    Cancer Maternal Uncle        throat cancer   Cancer Maternal Uncle        throat cancer   Diabetes Neg Hx    Heart disease Neg Hx     Social History Social History   Tobacco Use   Smoking status: Former Smoker    Packs/day: 5.00    Years: 20.00    Pack years: 100.00    Types: Cigars    Quit date: 12/22/1980    Years since quitting: 37.9   Smokeless tobacco: Never Used  Substance Use Topics   Alcohol use: Yes    Comment: ,moderate use   Drug use: No     Allergies   Patient has no known allergies.   Review of Systems Review of Systems All other systems reviewed and  are negative except that which was mentioned in HPI   Physical Exam Updated Vital Signs BP (!) 181/115    Pulse 73    Temp 98.9 F (37.2 C) (Oral)    Resp (!) 21    SpO2 97%   Physical Exam Vitals signs and nursing note reviewed.  Constitutional:      General: He is not in acute distress.    Appearance: He is well-developed.     Comments: Awake, alert  HENT:     Head: Normocephalic and atraumatic.  Eyes:     Extraocular Movements: Extraocular movements intact.     Conjunctiva/sclera: Conjunctivae normal.     Pupils: Pupils are equal, round, and reactive to light.  Neck:     Musculoskeletal: Neck supple.  Cardiovascular:     Rate and Rhythm: Normal rate and regular rhythm.     Heart sounds: Normal heart sounds. No murmur.  Pulmonary:     Effort: Pulmonary effort is normal.  No respiratory distress.     Breath sounds: Normal breath sounds.  Abdominal:     General: Bowel sounds are normal. There is no distension.     Palpations: Abdomen is soft.     Tenderness: There is no abdominal tenderness.  Skin:    General: Skin is warm and dry.  Neurological:     Mental Status: He is alert and oriented to person, place, and time.     Cranial Nerves: No cranial nerve deficit.     Sensory: No sensory deficit.     Motor: No abnormal muscle tone.     Deep Tendon Reflexes: Reflexes are normal and symmetric.     Comments: Fluent speech, 3/5 strength LUE with poor coordination; 5/5 strength RUE; 4/5 strength BLE  Psychiatric:        Thought Content: Thought content normal.        Judgment: Judgment normal.      ED Treatments / Results  Labs (all labs ordered are listed, but only abnormal results are displayed) Labs Reviewed  CBC - Abnormal; Notable for the following components:      Result Value   WBC 11.2 (*)    HCT 53.8 (*)    All other components within normal limits  DIFFERENTIAL - Abnormal; Notable for the following components:   Neutro Abs 9.3 (*)    All other components within normal limits  COMPREHENSIVE METABOLIC PANEL - Abnormal; Notable for the following components:   Glucose, Bld 109 (*)    AST 48 (*)    Total Bilirubin 1.6 (*)    GFR calc non Af Amer 52 (*)    All other components within normal limits  URINALYSIS, ROUTINE W REFLEX MICROSCOPIC - Abnormal; Notable for the following components:   Hgb urine dipstick LARGE (*)    Protein, ur 100 (*)    All other components within normal limits  CK - Abnormal; Notable for the following components:   Total CK 1,650 (*)    All other components within normal limits  I-STAT CHEM 8, ED - Abnormal; Notable for the following components:   Glucose, Bld 107 (*)    Calcium, Ion 1.06 (*)    Hemoglobin 17.3 (*)    All other components within normal limits  SARS CORONAVIRUS 2 (TAT 6-24 HRS)  ETHANOL  RAPID  URINE DRUG SCREEN, HOSP PERFORMED  PROTIME-INR  APTT  HEMOGLOBIN A1C  LIPID PANEL  HEPARIN LEVEL (UNFRACTIONATED)    EKG EKG Interpretation  Date/Time:  Saturday November 25 2018 16:50:42 EST Ventricular Rate:  76 PR Interval:    QRS Duration: 119 QT Interval:  427 QTC Calculation: 481 R Axis:   -79 Text Interpretation: Sinus rhythm Left anterior fascicular block Nonspecific T abnormalities, lateral leads Borderline ST elevation, anterolateral leads No previous ECGs available Confirmed by Frederick PeersLittle, Doye Montilla [phone]([id number]) on 11/25/2018 5:19:20 PM   Radiology Ct Angio Head W Or Wo Contrast  Result Date: 11/25/2018 CLINICAL DATA:  Stroke follow-up EXAM: CT ANGIOGRAPHY HEAD AND NECK TECHNIQUE: Multidetector CT imaging of the head and neck was performed using the standard protocol during bolus administration of intravenous contrast. Multiplanar CT image reconstructions and MIPs were obtained to evaluate the vascular anatomy. Carotid stenosis measurements (when applicable) are obtained utilizing NASCET criteria, using the distal internal carotid diameter as the denominator. CONTRAST:  100mL OMNIPAQUE IOHEXOL 350 MG/ML SOLN COMPARISON:  Head CT 11/25/2018 FINDINGS: CTA NECK FINDINGS SKELETON: There is no bony spinal canal stenosis. No lytic or blastic lesion. OTHER NECK: Normal pharynx, larynx and major salivary glands. No cervical lymphadenopathy. Unremarkable thyroid gland. UPPER CHEST: No pneumothorax or pleural effusion. No nodules or masses. AORTIC ARCH AND PULMONARY ARTERIES: There is a filling defect within the right main pulmonary artery and the left upper lobar artery. There is mild calcific atherosclerosis of the aortic arch. There is no aneurysm, dissection or hemodynamically significant stenosis of the visualized portion of the aorta. Normal variant aortic arch branching pattern with the left vertebral artery arising independently from the aortic arch. The visualized proximal subclavian arteries  are widely patent. RIGHT CAROTID SYSTEM: Normal without aneurysm, dissection or stenosis. LEFT CAROTID SYSTEM: Normal without aneurysm, dissection or stenosis. VERTEBRAL ARTERIES: Right dominant configuration. Both origins are clearly patent. There is no dissection, occlusion or flow-limiting stenosis to the skull base (V1-V3 segments). CTA HEAD FINDINGS POSTERIOR CIRCULATION: --Vertebral arteries: Normal V4 segments. --Posterior inferior cerebellar arteries (PICA): Patent origins from the vertebral arteries. --Anterior inferior cerebellar arteries (AICA): Patent origins from the basilar artery. --Basilar artery: Normal. --Superior cerebellar arteries: Normal. --Posterior cerebral arteries: The proximal P2 segment of the right PCA is occluded. The left PCA is normal. Posterior communicating arteries are diminutive or absent. ANTERIOR CIRCULATION: --Intracranial internal carotid arteries: Normal. --Anterior cerebral arteries (ACA): Normal. Both A1 segments are present. Patent anterior communicating artery (a-comm). --Middle cerebral arteries (MCA): Normal. VENOUS SINUSES: As permitted by contrast timing, patent. ANATOMIC VARIANTS: None Review of the MIP images confirms the above findings. IMPRESSION: 1. Occlusion of the proximal P2 segment of the right PCA. No other intracranial arterial occlusion or high-grade stenosis. 2. Acute pulmonary emboli within the right main pulmonary artery and left upper lobar artery. 3. Aortic Atherosclerosis (ICD10-I70.0). Critical Value/emergent results were called by telephone at the time of interpretation on 11/25/2018 at 9:52 pm to providerMCNEILL [facility] , who verbally acknowledged these results. Electronically Signed   By: Deatra RobinsonKevin  Herman M.D.   On: 11/25/2018 22:07   Ct Head Wo Contrast  Result Date: 11/25/2018 CLINICAL DATA:  Left arm weakness EXAM: CT HEAD WITHOUT CONTRAST TECHNIQUE: Contiguous axial images were obtained from the base of the skull through the vertex  without intravenous contrast. COMPARISON:  None. FINDINGS: Brain: There is focal hypodensity of the posterolateral right thalamus and adjacent cerebral pedicle (series 3, image 17) in addition to the inferior right occipital lobe (series 3, image 12, series 5, image 50). There is extensive underlying periventricular and deep white matter hypodensity. Nonacute left PCA territory occipital encephalomalacia (series 3, image 16). Vascular: No hyperdense vessel  or unexpected calcification. Skull: Normal. Negative for fracture or focal lesion. Sinuses/Orbits: No acute finding. Other: None. IMPRESSION: 1. There is focal hypodensity of the posterolateral right thalamus and adjacent cerebral pedicle (series 3, image 17) in addition to the inferior right occipital lobe (series 3, image 12, series 5, image 50). Findings are suspicious for acute to subacute edematous PCA territory infarction. MRI may be used to more sensitively assess for acute diffusion restricting infarction if desired. 2. Underlying small-vessel white matter disease and nonacute left PCA territory encephalomalacia. Electronically Signed   By: Lauralyn Primes M.D.   On: 11/25/2018 18:12   Ct Angio Neck W Or Wo Contrast  Result Date: 11/25/2018 CLINICAL DATA:  Stroke follow-up EXAM: CT ANGIOGRAPHY HEAD AND NECK TECHNIQUE: Multidetector CT imaging of the head and neck was performed using the standard protocol during bolus administration of intravenous contrast. Multiplanar CT image reconstructions and MIPs were obtained to evaluate the vascular anatomy. Carotid stenosis measurements (when applicable) are obtained utilizing NASCET criteria, using the distal internal carotid diameter as the denominator. CONTRAST:  OMNIPAQUE IOHEXOL 350 MG/ML SOLN COMPARISON:  Head CT 11/25/2018 FINDINGS: CTA NECK FINDINGS SKELETON: There is no bony spinal canal stenosis. No lytic or blastic lesion. OTHER NECK: Normal pharynx, larynx and major salivary glands. No cervical  lymphadenopathy. Unremarkable thyroid gland. UPPER CHEST: No pneumothorax or pleural effusion. No nodules or masses. AORTIC ARCH AND PULMONARY ARTERIES: There is a filling defect within the right main pulmonary artery and the left upper lobar artery. There is mild calcific atherosclerosis of the aortic arch. There is no aneurysm, dissection or hemodynamically significant stenosis of the visualized portion of the aorta. Normal variant aortic arch branching pattern with the left vertebral artery arising independently from the aortic arch. The visualized proximal subclavian arteries are widely patent. RIGHT CAROTID SYSTEM: Normal without aneurysm, dissection or stenosis. LEFT CAROTID SYSTEM: Normal without aneurysm, dissection or stenosis. VERTEBRAL ARTERIES: Right dominant configuration. Both origins are clearly patent. There is no dissection, occlusion or flow-limiting stenosis to the skull base (V1-V3 segments). CTA HEAD FINDINGS POSTERIOR CIRCULATION: --Vertebral arteries: Normal V4 segments. --Posterior inferior cerebellar arteries (PICA): Patent origins from the vertebral arteries. --Anterior inferior cerebellar arteries (AICA): Patent origins from the basilar artery. --Basilar artery: Normal. --Superior cerebellar arteries: Normal. --Posterior cerebral arteries: The proximal P2 segment of the right PCA is occluded. The left PCA is normal. Posterior communicating arteries are diminutive or absent. ANTERIOR CIRCULATION: --Intracranial internal carotid arteries: Normal. --Anterior cerebral arteries (ACA): Normal. Both A1 segments are present. Patent anterior communicating artery (a-comm). --Middle cerebral arteries (MCA): Normal. VENOUS SINUSES: As permitted by contrast timing, patent. ANATOMIC VARIANTS: None Review of the MIP images confirms the above findings. IMPRESSION: 1. Occlusion of the proximal P2 segment of the right PCA. No other intracranial arterial occlusion or high-grade stenosis. 2. Acute pulmonary  emboli within the right main pulmonary artery and left upper lobar artery. 3. Aortic Atherosclerosis (ICD10-I70.0). Critical Value/emergent results were called by telephone at the time of interpretation on 11/25/2018 at 9:52 pm to providerMCNEILL [facility] , who verbally acknowledged these results. Electronically Signed   By: Deatra Robinson M.D.   On: 11/25/2018 22:07   Dg Chest Portable 1 View  Result Date: 11/25/2018 CLINICAL DATA:  Cough, hypoxia EXAM: PORTABLE CHEST 1 VIEW COMPARISON:  03/05/2018 FINDINGS: Cardiomegaly, vascular congestion. Right lung clear. Left lower lobe opacity, likely atelectasis. No overt edema or visible effusions. No acute bony abnormality. IMPRESSION: Cardiomegaly, vascular congestion. Left base opacity, favor atelectasis. Electronically  Signed   By: Charlett Nose M.D.   On: 11/25/2018 19:38    Procedures Procedures (including critical care time)  Medications Ordered in ED Medications - No data to display   Initial Impression / Assessment and Plan / ED Course  I have reviewed the triage vital signs and the nursing notes.  Pertinent labs & imaging results that were available during my care of the patient were reviewed by me and considered in my medical decision making (see chart for details).       PT alert and comfortable on exam. L arm weakness noted which sounds new, known LSN therefore did not call code stroke. Labs notable for UA w/ hematuria, CK 1650. Gave IVF bolus.  Head CT shows acute to subacute stroke in PCA territory on right.  Consulted neurology, Dr. Amada Jupiter, who will see the patient in consultation and agrees with admission.  Discussed admission with [facility] hospitalist, Dr. Mikeal Hawthorne.   Final Clinical Impressions(s) / ED Diagnoses   Final diagnoses:  Acute ischemic stroke [facility])  Left arm weakness  Fall, initial encounter    ED Discharge Orders    None       Kerilyn Cortner, Ambrose Finland, MD 11/25/18 2340

## 2018-11-25 NOTE — ED Triage Notes (Signed)
Pt arrives via EMS from home with reports of waking up normal. Then around 9 am, he stated he became dizzy and "not right." Pt found laying supine on floor in urine and stool. Found altered. EMS reports left arm weakness and drift and bilateral leg weakness. Pt does not remember falling, denies any pain.

## 2018-11-25 NOTE — Progress Notes (Signed)
ANTICOAGULATION CONSULT NOTE - Initial Consult  Pharmacy Consult for heparin Indication: pulmonary embolus  No Known Allergies  Patient Measurements:   Heparin Dosing Weight: 85 kg  Vital Signs: Temp: 98.9 F (37.2 C) (11/14 1656) Temp Source: Oral (11/14 1656) BP: 153/92 (11/14 2100) Pulse Rate: 69 (11/14 2200)  Labs: Recent Labs    11/25/18 1716 11/25/18 1750  HGB 17.0 17.3*  HCT 53.8* 51.0  PLT 184  --   CREATININE 1.24 1.10  CKTOTAL 1,650*  --     CrCl cannot be calculated (Unknown ideal weight.).   Medical History: Past Medical History:  Diagnosis Date  . Biceps tendonitis 12/24/2010  . 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 Hypertension     Medications:  See medication history  Assessment: 85 yo man admitted with CVA to start heparin for PE.  He was not on anticoagulation PTA. Hg 17.3, PTLC 184  Goal of Therapy:  Heparin level 0.3-0.5 units/ml Monitor platelets by anticoagulation protocol: Yes   Plan:  Start heparin with no bolus at 1200 units/hr Check heparin level 6-8 hours after start Daily HL and CBC while on heparin Monitor for bleeding complications  Mauria Asquith Poteet 11/25/2018,10:05 PM

## 2018-11-25 NOTE — Consult Note (Signed)
Neurology Consultation Reason for Consult: Left-sided weakness Referring Physician: Evlyn Kanner  CC: Left-sided weakness  History is obtained from: Patient  HPI: Jeff Lamb is a 85 y.o. male with weakness that started this morning, he suspects sometime around 9 or 10 AM he began noticing some left-sided weakness.  He was found lying on the floor and was brought into the emergency department this afternoon.  He was unfortunately out of the window, and he was worked up in the ER with a head CT which demonstrates a thalamic and PCA territory infarction.    LKW: 9 AM tpa given?: no, out of window    ROS: A 14 point ROS was performed and is negative except as noted in the HPI.  Past Medical History:  Diagnosis Date  . Biceps tendonitis 12/24/2010  . 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 Hypertension      Family History  Problem Relation Age of Onset  . Stroke Mother   . Hypertension Mother   . Cancer Maternal Uncle        throat cancer  . Cancer Maternal Uncle        throat cancer  . Diabetes Neg Hx   . Heart disease Neg Hx      Social History:  reports that he quit smoking about 37 years ago. His smoking use included cigars. He has a 100.00 pack-year smoking history. He has never used smokeless tobacco. He reports current alcohol use. He reports that he does not use drugs.   Exam: Current vital signs: BP (!) 156/86   Pulse 68   Temp 98.9 F (37.2 C) (Oral)   Resp 14   SpO2 97%  Vital signs in last 24 hours: Temp:  [98.9 F (37.2 C)] 98.9 F (37.2 C) (11/14 1656) Pulse Rate:  [66-73] 68 (11/14 1945) Resp:  [13-22] 14 (11/14 1945) BP: (151-181)/(84-115) 156/86 (11/14 1945) SpO2:  [92 %-98 %] 97 % (11/14 1945)   Physical Exam  Constitutional: Appears well-developed and well-nourished.  Psych: Affect appropriate to situation Eyes: No scleral injection HENT: No OP obstrucion MSK: no joint deformities.  Cardiovascular:  Normal rate and regular rhythm.  Respiratory: Effort normal, non-labored breathing GI: Soft.  No distension. There is no tenderness.  Skin: WDI  Neuro: Mental Status: Patient is awake, alert, oriented to person, place, month, year, and situation. Patient is able to give a clear and coherent history. No signs of aphasia or neglect Cranial Nerves: II: He has a left upper > lower  field cut. pupils are equal, round, and reactive to light.   III,IV, VI: EOMI without ptosis or diploplia.  V: Facial sensation is diminished on the left VII: Facial movement is symmetric.  VIII: hearing is intact to voice X: Uvula elevates symmetrically XI: Shoulder shrug is symmetric. XII: tongue is midline without atrophy or fasciculations.  Motor: Tone is normal. Bulk is normal.  He has 4/5 strength of the left arm and leg though his fine motor movements are impaired out of proportion to weakness Sensory: Sensation is diminished throughout the left side Cerebellar: FNF and HKS are intact on the right  I have reviewed labs in epic and the results pertinent to this consultation are: cmp - unremarkable Mild leukocytosis.   I have reviewed the images obtained: CT head - right thalamic and PCA territory hypodensity.   Impression: 85 year old male with what appears to be a PCA territory infarct.  I suspect  weakness is from the thalamic involvement.  He will need to be admitted for secondary risk factor modification and therapy.  Recommendations: - HgbA1c, fasting lipid panel - MRI of the brain without contrast - Frequent neuro checks - Echocardiogram -CTA head and neck - Prophylactic therapy-Antiplatelet med: Aspirin - dose 325mg  PO or 300mg  PR - Risk factor modification - Telemetry monitoring - PT consult, OT consult, Speech consult - Stroke team to follow    Roland Rack, MD [facility] [phone]  If 7pm- 7am, please page neurology on call as listed in [place].

## 2018-11-25 NOTE — ED Notes (Addendum)
Pt. Aroused from sleep by pain in left chest utilized call light for help. Pt. Described pain as a ballon tightening in his chest and causing pressure. Was able to get to Pt. Immediately, Pt's pain subsided with in the next minute or so. Pain duration approximately 2 minutes. Pt. States pain is now completley gone. Pt repositioned and vitals WDL

## 2018-11-26 ENCOUNTER — Inpatient Hospital Stay (HOSPITAL_COMMUNITY): Payer: Medicare Other

## 2018-11-26 ENCOUNTER — Encounter (HOSPITAL_COMMUNITY): Payer: Self-pay

## 2018-11-26 DIAGNOSIS — E782 Mixed hyperlipidemia: Secondary | ICD-10-CM

## 2018-11-26 DIAGNOSIS — Z86711 Personal history of pulmonary embolism: Secondary | ICD-10-CM | POA: Diagnosis present

## 2018-11-26 DIAGNOSIS — I2699 Other pulmonary embolism without acute cor pulmonale: Secondary | ICD-10-CM

## 2018-11-26 DIAGNOSIS — R7303 Prediabetes: Secondary | ICD-10-CM | POA: Diagnosis present

## 2018-11-26 DIAGNOSIS — I10 Essential (primary) hypertension: Secondary | ICD-10-CM

## 2018-11-26 DIAGNOSIS — I361 Nonrheumatic tricuspid (valve) insufficiency: Secondary | ICD-10-CM

## 2018-11-26 LAB — APTT: aPTT: 65 seconds — ABNORMAL HIGH (ref 24–36)

## 2018-11-26 LAB — LIPID PANEL
Cholesterol: 78 mg/dL (ref 0–200)
HDL: 46 mg/dL (ref >40)
LDL Cholesterol: 29 mg/dL (ref 0–99)
Total CHOL/HDL Ratio: 1.7 RATIO
Triglycerides: 16 mg/dL (ref <150)
VLDL: 3 mg/dL (ref 0–40)

## 2018-11-26 LAB — PROTIME-INR
INR: 1.1 (ref 0.8–1.2)
Prothrombin Time: 13.9 seconds (ref 11.4–15.2)

## 2018-11-26 LAB — HEPARIN LEVEL (UNFRACTIONATED)
Heparin Unfractionated: 0.43 IU/mL (ref 0.30–0.70)
Heparin Unfractionated: 0.37 IU/mL (ref 0.30–0.70)

## 2018-11-26 LAB — ECHOCARDIOGRAM COMPLETE
Height: 70 in
Weight: 3058.22 oz

## 2018-11-26 LAB — HEMOGLOBIN A1C
Hgb A1c MFr Bld: 6.4 % — ABNORMAL HIGH (ref 4.8–5.6)
Mean Plasma Glucose: 136.98 mg/dL

## 2018-11-26 MED ORDER — ATORVASTATIN CALCIUM 40 MG PO TABS
40.0000 mg | ORAL_TABLET | Freq: Every morning | ORAL | Status: DC
  Administered 2018-11-26: 10:00:00 40 mg via ORAL
  Filled 2018-11-26: qty 1

## 2018-11-26 MED ORDER — MORPHINE SULFATE (PF) 2 MG/ML IV SOLN
2.0000 mg | INTRAVENOUS | Status: DC | PRN
  Administered 2018-11-26 (×2): 2 mg via INTRAVENOUS
  Filled 2018-11-26 (×4): qty 1

## 2018-11-26 MED ORDER — ADULT MULTIVITAMIN W/MINERALS CH
1.0000 | ORAL_TABLET | Freq: Every morning | ORAL | Status: DC
  Administered 2018-11-26 – 2018-11-30 (×5): 1 via ORAL
  Filled 2018-11-26 (×5): qty 1

## 2018-11-26 MED ORDER — ATORVASTATIN CALCIUM 10 MG PO TABS
20.0000 mg | ORAL_TABLET | Freq: Every morning | ORAL | Status: DC
  Administered 2018-11-27 – 2018-11-30 (×4): 20 mg via ORAL
  Filled 2018-11-26 (×4): qty 2

## 2018-11-26 NOTE — Evaluation (Signed)
Speech Language Pathology Evaluation Patient Details Name: Jeff Lamb MRN: [id number] DOB: 1929-02-17 Today's Date: 11/26/2018 Time: 0962-8366 SLP Time Calculation (min) (ACUTE ONLY): 27 min  Problem List:  Patient Active Problem List   Diagnosis Date Noted  . Pulmonary embolism (HCC) 11/26/2018  . Prediabetes 11/26/2018  . Acute cerebrovascular accident (CVA) (HCC) 11/25/2018  . Right shoulder pain 01/17/2018  . Ventral hernia without obstruction or gangrene 03/14/2017  . Cerumen impaction 11/20/2013  . CVA (cerebral infarction) 07/12/2013  . Hyperlipidemia 07/12/2013  . Erectile dysfunction 08/02/2011  . Routine health maintenance 12/24/2010  . Hypertension    Past Medical History:  Past Medical History:  Diagnosis Date  . Biceps tendonitis 12/24/2010  . 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 Hypertension    Past Surgical History:  Past Surgical History:  Procedure Laterality Date  . APPENDECTOMY     31  . CATARACT EXTRACTION W/ INTRAOCULAR LENS IMPLANT  October '12   left.   HPI:  [name], 89y/m, brought to emergency room after being found down at home. MRI revealed Acute infarct right PCA territory without hemorrhage. PMH of TIA, hypertension and hyperlipidemia. No history of swallow difficulties.    Assessment / Plan / Recommendation Clinical Impression  Linguistic/Cognitive evaluation completed after stroke confirmed. Patient presents with linguistic and cognitive abilities consistent with his baseline abilities. He conversed with SLP at the conversational level. Due to known hearing loss, an increase in speech volume is required and often repeating oneself, especially while needing to wear mask at this time. Paient asked for assistance to work TV controls, a list of channels and how to order meals. [place] Cognitive evalution completed with a score of 24/30 (26 or greater being within normal limits). Areas of deficits were  in the areas of visuospatial processing as well as memory recall. Paient does not require follow up in the acute setting, however recommend further indepth evaluation at the next level of care.     SLP Assessment  SLP Recommendation/Assessment: All further Speech Lanaguage Pathology  needs can be addressed in the next venue of care SLP Visit Diagnosis: Cognitive communication deficit (R41.841)    Follow Up Recommendations  Skilled Nursing facility    Frequency and Duration           SLP Evaluation Cognition  Overall Cognitive Status: Within Functional Limits for tasks assessed Arousal/Alertness: Awake/alert Orientation Level: Oriented X4 Attention: Focused;Sustained Focused Attention: Appears intact Sustained Attention: Appears intact Memory: Appears intact Immediate Memory Recall: Sock;Blue;Bed Memory Recall Sock: Without Cue Memory Recall Blue: Without Cue Memory Recall Bed: Without Cue Awareness: Appears intact Problem Solving: Appears intact Executive Function: Reasoning;Sequencing Reasoning: Appears intact Sequencing: Appears intact Safety/Judgment: Appears intact       Comprehension  Auditory Comprehension Overall Auditory Comprehension: Appears within functional limits for tasks assessed Yes/No Questions: Within Functional Limits Commands: Within Functional Limits Conversation: Complex Interfering Components: Hearing EffectiveTechniques: Increased volume;Repetition Visual Recognition/Discrimination Discrimination: Within Function Limits Reading Comprehension Reading Status: Within funtional limits    Expression Expression Primary Mode of Expression: Verbal Verbal Expression Overall Verbal Expression: Appears within functional limits for tasks assessed Initiation: No impairment Level of Generative/Spontaneous Verbalization: Conversation Repetition: No impairment Naming: No impairment Pragmatics: No impairment Non-Verbal Means of Communication: Not  applicable Written Expression Dominant Hand: Right Written Expression: Within Functional Limits   Oral / Motor  Oral Motor/Sensory Function Overall Oral Motor/Sensory Function: Within functional limits Motor Speech Overall Motor Speech:  Appears within functional limits for tasks assessed Respiration: Within functional limits Phonation: Normal Resonance: Within functional limits Articulation: Within functional limitis Intelligibility: Intelligible Motor Planning: Witnin functional limits Motor Speech Errors: Not applicable   GO                   Wynelle Bourgeois., MA CCC-SLP 11/26/2018, 3:29 PM

## 2018-11-26 NOTE — ED Notes (Signed)
Pt. Called out for second time tonight in pain over in left ankle. First time was approximately around [place]. Pt. Stated pain was 5 but pain went away after 2 minutes. Pt. Called out for same pain in ankle, stabbing sharp pain at level of 10. Pain continues to persist for past 10 minutes. Pt. In pain to point of vomiting, nausea presists but dry heaves only. Attending paged

## 2018-11-26 NOTE — Progress Notes (Signed)
CSW acknowledges consult for assistance. PT/OT evaluations are currently pending. CSW will assist with disposition planning based off therapy recommendations.   CSW will continue to follow and assist with needs.   Domenic Schwab, MSW, [place] Worker [facility]  [phone]

## 2018-11-26 NOTE — Progress Notes (Signed)
OT Cancellation Note  Patient Details Name: Jeff Lamb MRN: [id number] DOB: May 07, 1929   Cancelled Treatment:    Reason Eval/Treat Not Completed: Patient not medically ready(PE started heparin 10/14 @10  pm) Therapeautic guidelines recommend 24 hours with therapeutic level 03-0.7 heparin.   [name], OTR/L  Acute Rehabilitation Services Pager: [phone] Office: [phone] .  11/26/2018, 8:09 AM

## 2018-11-26 NOTE — ED Notes (Signed)
Pt. Reported sharp ,pain in left ankle for second time in (approximate) past 2 hours. First time Pt. Stated pain was 5 them left. Pt. Now reports pain (stabbing) of 10 but states that is easing continuing to monitor.

## 2018-11-26 NOTE — Progress Notes (Signed)
   11/26/18 1700  Clinical Encounter Type  Visited With Patient;Health care provider  Visit Type Initial;Social support;Psychological support  Referral From Nurse  Consult/Referral To Nurse  Spiritual Encounters  Spiritual Needs Emotional  Stress Factors  Patient Stress Factors Major life changes;Loss of control   Referral from night RN, spoke w/ day RN who expressed that pt has been often irritated by L arm, as result of stroke.  She also noted that he had been given pain meds.  Met w/ pt, who kept repeating that he was fine and asked for his mask and a fresh cup of ice water, he also took off his oxygen when thinking he was reaching to his ear for face mask.  I let RN know everything.  Please reconsult as appropriate.  Temple Pacini [place], [phone]

## 2018-11-26 NOTE — Progress Notes (Signed)
Called regarding presence of pulmonary emboli on CTA chest. Though there is some risk with anticoagulation, with the presence of PE, I would favor low dose heparin at this time.   Roland Rack, MD [facility] [phone]  If 7pm- 7am, please page neurology on call as listed in [place].

## 2018-11-26 NOTE — Progress Notes (Signed)
ANTICOAGULATION CONSULT NOTE - Follow Up Consult  Pharmacy Consult for heparin Indication: pulmonary embolus  No Known Allergies  Patient Measurements: Height: 5\' 10"  (177.8 cm) Weight: 191 lb 2.2 oz (86.7 kg) IBW/kg (Calculated) : 73 Heparin Dosing Weight: 85 kg  Vital Signs: Temp: 98.7 F (37.1 C) (11/15 0455) Temp Source: Oral (11/15 0455) BP: 149/86 (11/15 0455) Pulse Rate: 67 (11/15 0455)  Labs: Recent Labs    11/25/18 1716 11/25/18 1750 11/26/18 0353 11/26/18 0703  HGB 17.0 17.3*  --   --   HCT 53.8* 51.0  --   --   PLT 184  --   --   --   APTT  --   --  65*  --   LABPROT  --   --  13.9  --   INR  --   --  1.1  --   HEPARINUNFRC  --   --   --  0.43  CREATININE 1.24 1.10  --   --   CKTOTAL 1,650*  --   --   --     Estimated Creatinine Clearance: 48.9 mL/min (by C-G formula based on SCr of 1.1 mg/dL).   Medical History: Past Medical History:  Diagnosis Date  . Biceps tendonitis 12/24/2010  . 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 Hypertension     Assessment: 85 yo man admitted with CVA to start heparin for PE. Did not receive tPA since he was outside of the window. He was not on anticoagulation PTA. Baseline Hg 17.3, PTLC 184   Heparin level this AM is therapeutic at 0.43. No CBC today since collected yesterday evening. No bleeding noted.   Goal of Therapy:  Heparin level 0.3-0.5 units/ml Monitor platelets by anticoagulation protocol: Yes   Plan:  Continue heparin at 1200 units/hr No boluses with acute CVA Check confirmatory heparin level in 8 hours Daily heparin level and CBC while on heparin Monitor for bleeding complications  Vertis Kelch, PharmD PGY2 Cardiology Pharmacy Resident Phone [phone] 11/26/2018       7:49 AM  Please check [email or web address] for unit-specific pharmacist phone numbers

## 2018-11-26 NOTE — Progress Notes (Signed)
PT Cancellation Note  Patient Details Name: Jeff Lamb MRN: [id number] DOB: 13-Jul-1929   Cancelled Treatment:    Reason Eval/Treat Not Completed: Patient not medically ready - note +PE, heparin initiated 2200 on 10/14, will await 24  Hours w/therapeutic levels 0.3-0.7 prior to mobility assessment.   Kearney Hard [facility] 11/26/2018, 9:10 AM

## 2018-11-26 NOTE — Progress Notes (Signed)
*  PRELIMINARY RESULTS* Echocardiogram 2D Echocardiogram has been performed.  Leavy Cella 11/26/2018, 1:59 PM

## 2018-11-26 NOTE — Progress Notes (Signed)
Lower extremity venous has been completed.   Preliminary results in CV Proc.   Abram Sander 11/26/2018 10:31 AM

## 2018-11-26 NOTE — Progress Notes (Signed)
ANTICOAGULATION CONSULT NOTE - Follow Up Consult  Pharmacy Consult for heparin Indication: pulmonary embolus  No Known Allergies  Patient Measurements: Height: 5\' 10"  (177.8 cm) Weight: 191 lb 2.2 oz (86.7 kg) IBW/kg (Calculated) : 73 Heparin Dosing Weight: 85 kg  Vital Signs: Temp: 98.4 F (36.9 C) (11/15 1148) Temp Source: Oral (11/15 1148) BP: 160/98 (11/15 1148) Pulse Rate: 61 (11/15 1148)  Labs: Recent Labs    11/25/18 1716 11/25/18 1750 11/26/18 0353 11/26/18 0703 11/26/18 1443  HGB 17.0 17.3*  --   --   --   HCT 53.8* 51.0  --   --   --   PLT 184  --   --   --   --   APTT  --   --  65*  --   --   LABPROT  --   --  13.9  --   --   INR  --   --  1.1  --   --   HEPARINUNFRC  --   --   --  0.43 0.37  CREATININE 1.24 1.10  --   --   --   CKTOTAL 1,650*  --   --   --   --     Estimated Creatinine Clearance: 48.9 mL/min (by C-G formula based on SCr of 1.1 mg/dL).   Medical History: Past Medical History:  Diagnosis Date  . Biceps tendonitis 12/24/2010  . 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 Hypertension     Assessment: 85 yo man admitted with CVA to start heparin for PE. Did not receive tPA since he was outside of the window. He was not on anticoagulation PTA. Baseline Hg 17.3, PTLC 184   Confirmatory heparin level is therapeutic at 0.37. No bleeding noted.  Goal of Therapy:  Heparin level 0.3-0.5 units/ml Monitor platelets by anticoagulation protocol: Yes   Plan:  Continue heparin at 1200 units/hr No boluses with acute CVA Daily heparin level and CBC while on heparin Monitor for bleeding complications  Vertis Kelch, PharmD PGY2 Cardiology Pharmacy Resident Phone [phone] 11/26/2018       3:17 PM  Please check [email or web address] for unit-specific pharmacist phone numbers

## 2018-11-26 NOTE — Progress Notes (Signed)
STROKE TEAM PROGRESS NOTE   INTERVAL HISTORY No family is at the bedside.  Pt sitting in bed, awake alert, has left sided weakness and left hemianopia. He was upset that he can not drive now. He lives [place] in [place] and his grandson in [place]. He has good neighbors though.  OBJECTIVE Vitals:   11/26/18 0315 11/26/18 0440 11/26/18 0455 11/26/18 0826  BP: (!) 147/99 (!) 151/88 (!) 149/86 140/90  Pulse: 62 66 67 69  Resp: 14 16 18 16   Temp:  100 F (37.8 C) 98.7 F (37.1 C) 98.9 F (37.2 C)  TempSrc:  Oral Oral Oral  SpO2: 93% 100% 100% 100%  Weight:  86.7 kg    Height:  5\' 10"  (1.778 m)      CBC:  Recent Labs  Lab 11/25/18 1716 11/25/18 1750  WBC 11.2*  --   NEUTROABS 9.3*  --   HGB 17.0 17.3*  HCT 53.8* 51.0  MCV 99.4  --   PLT 184  --     Basic Metabolic Panel:  Recent Labs  Lab 11/25/18 1716 11/25/18 1750  NA 141 142  K 3.8 4.4  CL 102 104  CO2 24  --   GLUCOSE 109* 107*  BUN 13 18  CREATININE 1.24 1.10  CALCIUM 9.3  --     Lipid Panel:     Component Value Date/Time   CHOL 78 11/26/2018 0353   TRIG 16 11/26/2018 0353   HDL 46 11/26/2018 0353   CHOLHDL 1.7 11/26/2018 0353   VLDL 3 11/26/2018 0353   LDLCALC 29 11/26/2018 0353   HgbA1c:  Lab Results  Component Value Date   HGBA1C 6.4 (H) 11/26/2018   Urine Drug Screen:     Component Value Date/Time   LABOPIA NONE DETECTED 11/25/2018 1812   COCAINSCRNUR NONE DETECTED 11/25/2018 1812   LABBENZ NONE DETECTED 11/25/2018 1812   AMPHETMU NONE DETECTED 11/25/2018 1812   THCU NONE DETECTED 11/25/2018 1812   LABBARB NONE DETECTED 11/25/2018 1812    Alcohol Level     Component Value Date/Time   ETH <10 11/25/2018 1750    IMAGING  Ct Angio Head W Or Wo Contrast Ct Angio Neck W Or Wo Contrast 11/25/2018 IMPRESSION:  1. Occlusion of the proximal P2 segment of the right PCA. No other intracranial arterial occlusion or high-grade stenosis.  2. Acute pulmonary emboli within the right  main pulmonary artery and left upper lobar artery.  3. Aortic Atherosclerosis (ICD10-I70.0).   Ct Head Wo Contrast 11/25/2018 IMPRESSION:  1. There is focal hypodensity of the posterolateral right thalamus and adjacent cerebral pedicle (series 3, image 17) in addition to the inferior right occipital lobe (series 3, image 12, series 5, image 50). Findings are suspicious for acute to subacute edematous PCA territory infarction. MRI may be used to more sensitively assess for acute diffusion restricting infarction if desired.  2. Underlying small-vessel white matter disease and nonacute left PCA territory encephalomalacia.    Mr Brain Wo Contrast Result Date: 11/26/2018 CLINICAL DATA:  Stroke. EXAM: MRI HEAD WITHOUT CONTRAST TECHNIQUE: Multiplanar, multiecho pulse sequences of the brain and surrounding structures were obtained without intravenous contrast. COMPARISON:  CT head and CTA head and neck 11/25/2018 FINDINGS: Brain: Acute infarct right PCA territory. Restricted diffusion in the right thalamus as well as the right posterior hippocampus and inferior medial occipital lobe. Generalized atrophy. Chronic ischemic change in the white matter. Chronic infarcts in the cerebellum bilaterally. Small chronic infarct left thalamus. Small  chronic hemorrhage right posterolateral temporal lobe. Chronic hemorrhage in the left cerebellum. No mass lesion. Ventricle size normal. No midline shift. Mild atrophy. Vascular: Normal arterial flow voids Skull and upper cervical spine: Negative Sinuses/Orbits: Paranasal sinuses clear.  Bilateral cataract surgery Other: None IMPRESSION: Acute infarct right PCA territory without hemorrhage. Atrophy and chronic ischemic change as above. Chronic hemorrhage right posterolateral temporal lobe and left cerebellum. Electronically Signed   By: Marlan Palau M.D.   On: 11/26/2018 12:51   Vas [place] Lower Extremity Venous (dvt) Result Date: 11/26/2018  Lower Venous Study Indications:  Pulmonary embolism.  Comparison Study: no prior Performing Technologist: Blanch Media RVS  Examination Guidelines: A complete evaluation includes B-mode imaging, spectral Doppler, color Doppler, and power Doppler as needed of all accessible portions of each vessel. Bilateral testing is considered an integral part of a complete examination. Limited examinations for reoccurring indications may be performed as noted.  +---------+---------------+---------+-----------+----------+--------------+ RIGHT    CompressibilityPhasicitySpontaneityPropertiesThrombus Aging +---------+---------------+---------+-----------+----------+--------------+ CFV      Full           Yes      Yes                                 +---------+---------------+---------+-----------+----------+--------------+ SFJ      Full                                                        +---------+---------------+---------+-----------+----------+--------------+ FV Prox  Full                                                        +---------+---------------+---------+-----------+----------+--------------+ FV Mid   Full                                                        +---------+---------------+---------+-----------+----------+--------------+ FV DistalFull                                                        +---------+---------------+---------+-----------+----------+--------------+ PFV      Full                                                        +---------+---------------+---------+-----------+----------+--------------+ POP      Full           Yes      Yes                                 +---------+---------------+---------+-----------+----------+--------------+ PTV      Full                                                        +---------+---------------+---------+-----------+----------+--------------+  PERO     Full                                                         +---------+---------------+---------+-----------+----------+--------------+   +---------+---------------+---------+-----------+----------+--------------+ LEFT     CompressibilityPhasicitySpontaneityPropertiesThrombus Aging +---------+---------------+---------+-----------+----------+--------------+ CFV      Full           Yes      Yes                                 +---------+---------------+---------+-----------+----------+--------------+ SFJ      Full                                                        +---------+---------------+---------+-----------+----------+--------------+ FV Prox  Full                                                        +---------+---------------+---------+-----------+----------+--------------+ FV Mid   Full                                                        +---------+---------------+---------+-----------+----------+--------------+ FV DistalFull                                                        +---------+---------------+---------+-----------+----------+--------------+ PFV      Full                                                        +---------+---------------+---------+-----------+----------+--------------+ POP      Full           Yes      Yes                                 +---------+---------------+---------+-----------+----------+--------------+ PTV      Full                                                        +---------+---------------+---------+-----------+----------+--------------+ PERO     Full                                                        +---------+---------------+---------+-----------+----------+--------------+  No evidence of deep venous thrombosis in the lower extremities.   *See table(s) above for measurements and observations. Electronically signed by Monica Martinez MD on 11/26/2018 at 2:37:27 PM.    Final      Transthoracic Echocardiogram  Pending  ECG - SR rate 76  BPM. (See cardiology reading for complete details)   PHYSICAL EXAM  Temp:  [98.4 F (36.9 C)-100 F (37.8 C)] 98.4 F (36.9 C) (11/15 1148) Pulse Rate:  [61-73] 61 (11/15 1148) Resp:  [11-22] 16 (11/15 1148) BP: (122-181)/(70-115) 160/98 (11/15 1148) SpO2:  [91 %-100 %] 99 % (11/15 1148) Weight:  [86.7 kg] 86.7 kg (11/15 0440)  General - Well nourished, well developed, in no apparent distress.  Ophthalmologic - fundi not visualized due to noncooperation.  Cardiovascular - Regular rhythm and rate.  Mental Status -  Level of arousal and orientation to time, place, and person were intact. Language including expression, naming, repetition, comprehension was assessed and found intact. Fund of Knowledge was assessed and was intact.  Cranial Nerves II - XII - II - left hemianopia. III, IV, VI - Extraocular movements intact. V - Facial sensation intact bilaterally. VII - Facial movement intact bilaterally. VIII - Hearing & vestibular intact bilaterally. X - Palate elevates symmetrically. XI - Chin turning & shoulder shrug intact bilaterally. XII - Tongue protrusion intact.  Motor Strength - The patient's strength was normal in right upper and lower extremities, however, left UE 3/5 proximal and distal, left LE 3/5 proximal, knee flexion and 4+/5 DF/PF and pronator drift was absent.  Bulk was normal and fasciculations were absent.   Motor Tone - Muscle tone was assessed at the neck and appendages and was normal.  Reflexes - The patient's reflexes were symmetrical in all extremities and he had no pathological reflexes.  Sensory - Light touch, temperature/pinprick were assessed and were decreased light touch at LUE and diminished light touch at LLE    Coordination - The patient had normal movements in the right hand with no ataxia or dysmetria.  Tremor was absent.  Gait and Station - deferred.   ASSESSMENT/PLAN [name] is a 85 y.o. male with history of Htn, Hld, and  bilateral hearing loss presenting with left-sided weakness. Found to have PE. Now on Heparin IV.  He did not receive IV t-PA due to late presentation (>4.5 hours from time of onset).  Stroke:  Infarcts large right PCA due to occlusion R proximal P2 - could be related to PE if there is PFO  CT head - suspicious for acute to subacute edematous PCA territory infarction. Underlying small-vessel white matter disease and nonacute left PCA territory encephalomalacia.    MRI head - large right PCA infarct including right occipital and right thalamus  CTA H&N - Occlusion of the proximal P2 segment of the right PCA. Acute pulmonary emboli within the right main pulmonary artery and left upper lobar artery.   2D Echo - pending  LE venous doppler - no OVT  TCD bubble study pending  [employer] Virus 2 - negative  LDL - 29  HgbA1c - 6.4  UDS - negative  VTE prophylaxis - Heparin IV  aspirin 81 mg daily prior to admission, now on heparin IV. Close neuro check, if neuro changes, stat CT head to rule out hemorrhagic conversion in the brain.  Patient will be counseled to be compliant with his antithrombotic medications  Ongoing aggressive stroke risk factor management  Therapy recommendations:  pending  Disposition:  Pending  Pt can not drive due to hemianopia. He needs to follow up with ophthalmology.   Acute PE  Confirmed on CTA neck  DVT neg  On heparin IV  Unprovoked PE, recommend anticoagulation life long.   Hypertension  Home BP meds: Maxzide and HCTZ  Current BP meds: none   Stable . Permissive hypertension (OK if < 180/105) but gradually normalize in 5-7 days  . Long-term BP goal normotensive  Hyperlipidemia  Home Lipid lowering medication: Lipitor 40 mg daily   LDL 29, goal < 70  Current lipid lowering medication: Lipitor 20 mg daily   Continue statin at discharge  Other Stroke Risk Factors  Advanced age  Former cigarette smoker - quit  ETOH use,  advised to drink no more than 1 alcoholic beverage per day.  Family hx stroke (mother)  Other Active Problems  Leukocytosis - WBC 11.2 (probably 2/2 PE)  Hospital day # 1  Marvel Plan, MD PhD Stroke Neurology 11/26/2018 2:53 PM    To contact Stroke Continuity provider, please refer to [email or web address]. After hours, contact General Neurology

## 2018-11-26 NOTE — Progress Notes (Signed)
PROGRESS NOTE    Jeff Lamb  ZOX:[id number] DOB: [birth year] DOA: 11/25/2018 PCP: Myrlene Broker, MD    Brief Narrative:  85 year old gentleman with history of TIA with no residual neurological deficit, hypertension, hyperlipidemia was brought to emergency room, found down at home with suspected to stroke for unknown.  Time.  He apparently fell in the morning and was unable to get up.  Was shouting for neighbors who called 911.  In the emergency room he was found with left-sided hemiplegia, CT scan suggestive of right PCA territory stroke.  He was also found to have acute PE on the right pulmonary artery.  Seen by neurology on arrival, outside TPA window.   Assessment & Plan:   Principal Problem:   Acute cerebrovascular accident (CVA) (HCC) Active Problems:   Hypertension   Hyperlipidemia   Pulmonary embolism (HCC)   Prediabetes  Acute right PCA territory ischemic stroke: Clinical findings, left hemiplegia, upper extremity more than lower extremity. CT head findings, right PCA territory infarction. MRI of the brain, pending. CTA of the head and neck, right PCA occlusion 2D echocardiogram, pending Antiplatelet therapy, heparin LDL 29, statin not indicated Hemoglobin A1c, 6.4 suggesting prediabetes, will benefit with Metformin on discharge Therapy recommendations, pending.  Swallow evaluation normal. Anticipate acute inpatient rehab at discharge.  Hypertension: Blood pressure stable.  Permissive hypertension.  Not on treatment at home.  Acute pulmonary embolism: With no acute cor pulmonale.  On room air.  Hemodynamically stable.  Started on heparin drip.  Close monitoring.  Will need long-term anticoagulation.  Lower extremity duplex is pending.   DVT prophylaxis: Heparin subcu Code Status: Full code Family Communication: Daughter-in-law, Son Takeo Harts updated. Disposition Plan: Acute inpatient rehab   Consultants:   Neurology  Procedures:    None  Antimicrobials:   None   Subjective: Patient seen and examined.  No overnight events.  Not able to move his left arm.  Denies any chest pain, shortness of breath.  No history of thromboembolism.  Objective: Vitals:   11/26/18 0440 11/26/18 0455 11/26/18 0826 11/26/18 1148  BP: (!) 151/88 (!) 149/86 140/90 (!) 160/98  Pulse: 66 67 69 61  Resp: Temp: 100 F (37.8 C) 98.7 F (37.1 C) 98.9 F (37.2 C) 98.4 F (36.9 C)  TempSrc: Oral Oral Oral Oral  SpO2: 100% 100% 100% 99%  Weight: 86.7 kg     Height:  (1.778 m)       Intake/Output Summary (Last 24 hours) at 11/26/2018 1210 Last data filed at 11/26/2018 0506 Gross per 24 hour  Intake 767.1 ml  Output --  Net 767.1 ml   Filed Weights   11/26/18 0440  Weight: 86.7 kg    Examination:  General exam: Appears calm and comfortable, on room air.  Eating breakfast in the morning. Respiratory system: Clear to auscultation. Respiratory effort normal. Cardiovascular system: S1 & S2 heard, RRR. No JVD, murmurs, rubs, gallops or clicks. No pedal edema. Gastrointestinal system: Abdomen is nondistended, soft and nontender. No organomegaly or masses felt. Normal bowel sounds heard. Central nervous system: Alert and oriented.  Cranial nerve with no deficits. Extremities: Right upper and lower extremity, 5/5 Left upper extremity 2/5, left lower extremity 4/5. Skin: No rashes, lesions or ulcers Psychiatry: Judgement and insight appear normal. Mood & affect appropriate.     Data Reviewed: I have personally reviewed following labs and imaging studies  CBC: Recent Labs  Lab 11/25/18 1716 11/25/18 1750  WBC 11.2*  --  NEUTROABS 9.3*  --   HGB 17.0 17.3*  HCT 53.8* 51.0  MCV 99.4  --   PLT 184  --    Basic Metabolic Panel: Recent Labs  Lab 11/25/18 1716 11/25/18 1750  NA 141 142  K 3.8 4.4  CL 102 104  CO2 24  --   GLUCOSE 109* 107*  BUN 13 18  CREATININE 1.24 1.10  CALCIUM 9.3  --     GFR: Estimated Creatinine Clearance: 48.9 mL/min (by C-G formula based on SCr of 1.1 mg/dL). Liver Function Tests: Recent Labs  Lab 11/25/18 1716  AST 48*  ALT 26  ALKPHOS 69  BILITOT 1.6*  PROT 7.9  ALBUMIN 4.0   No results for input(s): LIPASE, AMYLASE in the last 168 hours. No results for input(s): AMMONIA in the last 168 hours. Coagulation Profile: Recent Labs  Lab 11/26/18 0353  INR 1.1   Cardiac Enzymes: Recent Labs  Lab 11/25/18 1716  CKTOTAL 1,650*   BNP (last 3 results) No results for input(s): PROBNP in the last 8760 hours. HbA1C: Recent Labs    11/26/18 0353  HGBA1C 6.4*   CBG: No results for input(s): GLUCAP in the last 168 hours. Lipid Profile: Recent Labs    11/26/18 0353  CHOL 78  HDL 46  LDLCALC 29  TRIG 16  CHOLHDL 1.7   Thyroid Function Tests: No results for input(s): TSH, T4TOTAL, FREET4, T3FREE, THYROIDAB in the last 72 hours. Anemia Panel: No results for input(s): VITAMINB12, FOLATE, FERRITIN, TIBC, IRON, RETICCTPCT in the last 72 hours. Sepsis Labs: No results for input(s): PROCALCITON, LATICACIDVEN in the last 168 hours.  Recent Results (from the past 240 hour(s))  SARS CORONAVIRUS 2 (TAT 6-24 HRS) Nasopharyngeal Nasopharyngeal Swab     Status: None   Collection Time: 11/25/18  5:18 PM   Specimen: Nasopharyngeal Swab  Result Value Ref Range Status   SARS Coronavirus 2 NEGATIVE NEGATIVE Final    Comment: (NOTE) SARS-CoV-2 target nucleic acids are NOT DETECTED. The SARS-CoV-2 RNA is generally detectable in upper and lower respiratory specimens during the acute phase of infection. Negative results do not preclude SARS-CoV-2 infection, do not rule out co-infections with other pathogens, and should not be used as the sole basis for treatment or other patient management decisions. Negative results must be combined with clinical observations, patient history, and epidemiological information. The expected result is  Negative. Fact Sheet for Patients: HairSlick.no Fact Sheet for Healthcare Providers: quierodirigir.com This test is not yet approved or cleared by the [place] FDA and  has been authorized for detection and/or diagnosis of SARS-CoV-2 by FDA under an Emergency Use Authorization (EUA). This EUA will remain  in effect (meaning this test can be used) for the duration of the COVID-19 declaration under Section 56 4(b)(1) of the Act, 21 U.S.C. section 360bbb-3(b)(1), unless the authorization is terminated or revoked sooner. Performed at [facility], [address] [place], [place] [address]          Radiology Studies: Ct Angio Head W Or Wo Contrast  Result Date: 11/25/2018 CLINICAL DATA:  Stroke follow-up EXAM: CT ANGIOGRAPHY HEAD AND NECK TECHNIQUE: Multidetector CT imaging of the head and neck was performed using the standard protocol during bolus administration of intravenous contrast. Multiplanar CT image reconstructions and MIPs were obtained to evaluate the vascular anatomy. Carotid stenosis measurements (when applicable) are obtained utilizing NASCET criteria, using the distal internal carotid diameter as the denominator. CONTRAST:  OMNIPAQUE IOHEXOL 350 MG/ML SOLN COMPARISON:  Head CT 11/25/2018 FINDINGS: CTA NECK FINDINGS SKELETON: There is no bony spinal canal stenosis. No lytic or blastic lesion. OTHER NECK: Normal pharynx, larynx and major salivary glands. No cervical lymphadenopathy. Unremarkable thyroid gland. UPPER CHEST: No pneumothorax or pleural effusion. No nodules or masses. AORTIC ARCH AND PULMONARY ARTERIES: There is a filling defect within the right main pulmonary artery and the left upper lobar artery. There is mild calcific atherosclerosis of the aortic arch. There is no aneurysm, dissection or hemodynamically significant stenosis of the visualized portion of the aorta. Normal variant aortic arch  branching pattern with the left vertebral artery arising independently from the aortic arch. The visualized proximal subclavian arteries are widely patent. RIGHT CAROTID SYSTEM: Normal without aneurysm, dissection or stenosis. LEFT CAROTID SYSTEM: Normal without aneurysm, dissection or stenosis. VERTEBRAL ARTERIES: Right dominant configuration. Both origins are clearly patent. There is no dissection, occlusion or flow-limiting stenosis to the skull base (V1-V3 segments). CTA HEAD FINDINGS POSTERIOR CIRCULATION: --Vertebral arteries: Normal V4 segments. --Posterior inferior cerebellar arteries (PICA): Patent origins from the vertebral arteries. --Anterior inferior cerebellar arteries (AICA): Patent origins from the basilar artery. --Basilar artery: Normal. --Superior cerebellar arteries: Normal. --Posterior cerebral arteries: The proximal P2 segment of the right PCA is occluded. The left PCA is normal. Posterior communicating arteries are diminutive or absent. ANTERIOR CIRCULATION: --Intracranial internal carotid arteries: Normal. --Anterior cerebral arteries (ACA): Normal. Both A1 segments are present. Patent anterior communicating artery (a-comm). --Middle cerebral arteries (MCA): Normal. VENOUS SINUSES: As permitted by contrast timing, patent. ANATOMIC VARIANTS: None Review of the MIP images confirms the above findings. IMPRESSION: 1. Occlusion of the proximal P2 segment of the right PCA. No other intracranial arterial occlusion or high-grade stenosis. 2. Acute pulmonary emboli within the right main pulmonary artery and left upper lobar artery. 3. Aortic Atherosclerosis (ICD10-I70.0). Critical Value/emergent results were called by telephone at the time of interpretation on 11/25/2018 at 9:52 pm to providerMCNEILL [facility] , who verbally acknowledged these results. Electronically Signed   By: Deatra Robinson M.D.   On: 11/25/2018 22:07   Ct Head Wo Contrast  Result Date: 11/25/2018 CLINICAL DATA:  Left arm  weakness EXAM: CT HEAD WITHOUT CONTRAST TECHNIQUE: Contiguous axial images were obtained from the base of the skull through the vertex without intravenous contrast. COMPARISON:  None. FINDINGS: Brain: There is focal hypodensity of the posterolateral right thalamus and adjacent cerebral pedicle (series 3, image 17) in addition to the inferior right occipital lobe (series 3, image 12, series 5, image 50). There is extensive underlying periventricular and deep white matter hypodensity. Nonacute left PCA territory occipital encephalomalacia (series 3, image 16). Vascular: No hyperdense vessel or unexpected calcification. Skull: Normal. Negative for fracture or focal lesion. Sinuses/Orbits: No acute finding. Other: None. IMPRESSION: 1. There is focal hypodensity of the posterolateral right thalamus and adjacent cerebral pedicle (series 3, image 17) in addition to the inferior right occipital lobe (series 3, image 12, series 5, image 50). Findings are suspicious for acute to subacute edematous PCA territory infarction. MRI may be used to more sensitively assess for acute diffusion restricting infarction if desired. 2. Underlying small-vessel white matter disease and nonacute left PCA territory encephalomalacia. Electronically Signed   By: Lauralyn Primes M.D.   On: 11/25/2018 18:12   Ct Angio Neck W Or Wo Contrast  Result Date: 11/25/2018 CLINICAL DATA:  Stroke follow-up EXAM: CT ANGIOGRAPHY HEAD AND NECK TECHNIQUE: Multidetector CT imaging of the head and neck was performed using the standard protocol during bolus administration of  intravenous contrast. Multiplanar CT image reconstructions and MIPs were obtained to evaluate the vascular anatomy. Carotid stenosis measurements (when applicable) are obtained utilizing NASCET criteria, using the distal internal carotid diameter as the denominator. CONTRAST:  OMNIPAQUE IOHEXOL 350 MG/ML SOLN COMPARISON:  Head CT 11/25/2018 FINDINGS: CTA NECK FINDINGS SKELETON: There  is no bony spinal canal stenosis. No lytic or blastic lesion. OTHER NECK: Normal pharynx, larynx and major salivary glands. No cervical lymphadenopathy. Unremarkable thyroid gland. UPPER CHEST: No pneumothorax or pleural effusion. No nodules or masses. AORTIC ARCH AND PULMONARY ARTERIES: There is a filling defect within the right main pulmonary artery and the left upper lobar artery. There is mild calcific atherosclerosis of the aortic arch. There is no aneurysm, dissection or hemodynamically significant stenosis of the visualized portion of the aorta. Normal variant aortic arch branching pattern with the left vertebral artery arising independently from the aortic arch. The visualized proximal subclavian arteries are widely patent. RIGHT CAROTID SYSTEM: Normal without aneurysm, dissection or stenosis. LEFT CAROTID SYSTEM: Normal without aneurysm, dissection or stenosis. VERTEBRAL ARTERIES: Right dominant configuration. Both origins are clearly patent. There is no dissection, occlusion or flow-limiting stenosis to the skull base (V1-V3 segments). CTA HEAD FINDINGS POSTERIOR CIRCULATION: --Vertebral arteries: Normal V4 segments. --Posterior inferior cerebellar arteries (PICA): Patent origins from the vertebral arteries. --Anterior inferior cerebellar arteries (AICA): Patent origins from the basilar artery. --Basilar artery: Normal. --Superior cerebellar arteries: Normal. --Posterior cerebral arteries: The proximal P2 segment of the right PCA is occluded. The left PCA is normal. Posterior communicating arteries are diminutive or absent. ANTERIOR CIRCULATION: --Intracranial internal carotid arteries: Normal. --Anterior cerebral arteries (ACA): Normal. Both A1 segments are present. Patent anterior communicating artery (a-comm). --Middle cerebral arteries (MCA): Normal. VENOUS SINUSES: As permitted by contrast timing, patent. ANATOMIC VARIANTS: None Review of the MIP images confirms the above findings. IMPRESSION: 1.  Occlusion of the proximal P2 segment of the right PCA. No other intracranial arterial occlusion or high-grade stenosis. 2. Acute pulmonary emboli within the right main pulmonary artery and left upper lobar artery. 3. Aortic Atherosclerosis (ICD10-I70.0). Critical Value/emergent results were called by telephone at the time of interpretation on 11/25/2018 at 9:52 pm to providerMCNEILL [facility] , who verbally acknowledged these results. Electronically Signed   By: Deatra Robinson M.D.   On: 11/25/2018 22:07   Dg Chest Portable 1 View  Result Date: 11/25/2018 CLINICAL DATA:  Cough, hypoxia EXAM: PORTABLE CHEST 1 VIEW COMPARISON:  03/05/2018 FINDINGS: Cardiomegaly, vascular congestion. Right lung clear. Left lower lobe opacity, likely atelectasis. No overt edema or visible effusions. No acute bony abnormality. IMPRESSION: Cardiomegaly, vascular congestion. Left base opacity, favor atelectasis. Electronically Signed   By: Charlett Nose M.D.   On: 11/25/2018 19:38   Dg Ankle Left Port  Result Date: 11/26/2018 CLINICAL DATA:  Ankle pain EXAM: PORTABLE LEFT ANKLE -3 VIEW COMPARISON:  None. FINDINGS: Tarsal degenerative changes are noted. Calcaneal spurring is seen. Soft tissue calcifications are noted beneath the calcaneus medially. No acute fracture or dislocation is noted. IMPRESSION: Soft tissue calcifications beneath the calcaneus medially of uncertain chronicity. Degenerative changes of the tarsal bones. Electronically Signed   By: Alcide Clever M.D.   On: 11/26/2018 03:00   Vas [place] Lower Extremity Venous (dvt)  Result Date: 11/26/2018  Lower Venous Study Indications: Pulmonary embolism.  Comparison Study: no prior Performing Technologist: Blanch Media RVS  Examination Guidelines: A complete evaluation includes B-mode imaging, spectral Doppler, color Doppler, and power Doppler as needed of all accessible portions of each  vessel. Bilateral testing is considered an integral part of a complete examination.  Limited examinations for reoccurring indications may be performed as noted.  +---------+---------------+---------+-----------+----------+--------------+  RIGHT     Compressibility Phasicity Spontaneity Properties Thrombus Aging  +---------+---------------+---------+-----------+----------+--------------+  CFV       Full            Yes       Yes                                    +---------+---------------+---------+-----------+----------+--------------+  SFJ       Full                                                             +---------+---------------+---------+-----------+----------+--------------+  FV Prox   Full                                                             +---------+---------------+---------+-----------+----------+--------------+  FV Mid    Full                                                             +---------+---------------+---------+-----------+----------+--------------+  FV Distal Full                                                             +---------+---------------+---------+-----------+----------+--------------+  PFV       Full                                                             +---------+---------------+---------+-----------+----------+--------------+  POP       Full            Yes       Yes                                    +---------+---------------+---------+-----------+----------+--------------+  PTV       Full                                                             +---------+---------------+---------+-----------+----------+--------------+  PERO      Full                                                             +---------+---------------+---------+-----------+----------+--------------+   +---------+---------------+---------+-----------+----------+--------------+  LEFT      Compressibility Phasicity Spontaneity Properties Thrombus Aging  +---------+---------------+---------+-----------+----------+--------------+  CFV       Full            Yes       Yes                                     +---------+---------------+---------+-----------+----------+--------------+  SFJ       Full                                                             +---------+---------------+---------+-----------+----------+--------------+  FV Prox   Full                                                             +---------+---------------+---------+-----------+----------+--------------+  FV Mid    Full                                                             +---------+---------------+---------+-----------+----------+--------------+  FV Distal Full                                                             +---------+---------------+---------+-----------+----------+--------------+  PFV       Full                                                             +---------+---------------+---------+-----------+----------+--------------+  POP       Full            Yes       Yes                                    +---------+---------------+---------+-----------+----------+--------------+  PTV       Full                                                             +---------+---------------+---------+-----------+----------+--------------+  PERO      Full                                                             +---------+---------------+---------+-----------+----------+--------------+     *  See table(s) above for measurements and observations.    Preliminary         Scheduled Meds:   stroke: mapping our early stages of recovery book   Does not apply Once   atorvastatin  40 mg Oral q morning - 10a   multivitamin with minerals  1 tablet Oral q morning - 10a   Continuous Infusions:  heparin 1,200 Units/hr (11/26/18 0506)     LOS: 1 day    Time spent: 35 minutes     Barb Merino, MD [facility] Pager [phone]

## 2018-11-27 ENCOUNTER — Inpatient Hospital Stay (HOSPITAL_COMMUNITY): Payer: Medicare Other

## 2018-11-27 DIAGNOSIS — I639 Cerebral infarction, unspecified: Secondary | ICD-10-CM

## 2018-11-27 DIAGNOSIS — I2699 Other pulmonary embolism without acute cor pulmonale: Secondary | ICD-10-CM

## 2018-11-27 LAB — HEPARIN LEVEL (UNFRACTIONATED): Heparin Unfractionated: 0.48 IU/mL (ref 0.30–0.70)

## 2018-11-27 LAB — CBC
HCT: 42.6 % (ref 39.0–52.0)
Hemoglobin: 14 g/dL (ref 13.0–17.0)
MCH: 31.7 pg (ref 26.0–34.0)
MCHC: 32.9 g/dL (ref 30.0–36.0)
MCV: 96.4 fL (ref 80.0–100.0)
Platelets: 162 10*3/uL (ref 150–400)
RBC: 4.42 MIL/uL (ref 4.22–5.81)
RDW: 12.8 % (ref 11.5–15.5)
WBC: 9.5 10*3/uL (ref 4.0–10.5)
nRBC: 0 % (ref 0.0–0.2)

## 2018-11-27 MED ORDER — HYDROCHLOROTHIAZIDE 12.5 MG PO CAPS
12.5000 mg | ORAL_CAPSULE | Freq: Every morning | ORAL | Status: DC
  Administered 2018-11-27 – 2018-11-30 (×4): 12.5 mg via ORAL
  Filled 2018-11-27 (×4): qty 1

## 2018-11-27 NOTE — Progress Notes (Signed)
PROGRESS NOTE    Jeff CoddingJohn Q Lamb  ZOX:[id number] DOB: 10/23/1929 DOA: 11/25/2018 PCP: Jeff Lamb, Jeff A, MD    Brief Narrative:  85 year old gentleman with history of TIA with no residual neurological deficit, hypertension, hyperlipidemia was brought to emergency room, found down at home with suspected to stroke for unknown.  Time.  He apparently fell in the morning and was unable to get up.  Was shouting for neighbors who called 911.  In the emergency room he was found with left-sided hemiplegia, CT scan suggestive of right PCA territory stroke.  He was also found to have acute PE on the right pulmonary artery.  Seen by neurology on arrival, outside TPA window.   Assessment & Plan:   Principal Problem:   Acute cerebrovascular accident (CVA) (HCC) Active Problems:   Hypertension   Hyperlipidemia   Pulmonary embolism (HCC)   Prediabetes  Acute right PCA territory ischemic stroke: Clinical findings, left hemiplegia, upper extremity more than lower extremity.  Left hemianopia. CT head findings, right PCA territory infarction. MRI of the brain, right PCA infarction CTA of the head and neck, right PCA occlusion 2D echocardiogram, no PFO Antiplatelet therapy, heparin LDL 29, on Lipitor 20 mg at home continued. Hemoglobin A1c, 6.4 suggesting prediabetes, will benefit with Metformin on discharge Therapy recommendations, pending.  Swallow evaluation normal. Anticipate acute inpatient rehab at discharge.  Hypertension: Blood pressure stable.  Permissive hypertension.  Not on treatment at home.  Acute pulmonary embolism: With no acute cor pulmonale.  On room air.  Unprovoked.  Hemodynamically stable.  Started on heparin drip.  Close monitoring.  Will need long-term anticoagulation.  Lower extremity duplex normal. Will change to Eliquis in the next 24 hours nearing discharge.   DVT prophylaxis: Heparin subcu Code Status: Full code Family Communication: Son Jeff Lamb updated  11/15. Disposition Plan: Acute inpatient rehab   Consultants:   Neurology  Procedures:   None  Antimicrobials:   None   Subjective: Patient seen and examined.  No overnight events.  Remains in very high spirit.  Hard of hearing, he is very motivated for rehab.  Today he can move his left arm slightly better than yesterday.  Remains on room air.  Objective: Vitals:   11/27/18 0010 11/27/18 0327 11/27/18 0742 11/27/18 0800  BP: (!) 148/70 (!) 146/70 (!) 161/112 (!) 159/84  Pulse: 62 66 63   Resp: 20 20 20    Temp: 98.1 F (36.7 C) 98.2 F (36.8 C) 98.8 F (37.1 C)   TempSrc: Oral Oral Oral   SpO2: 94% 96% 90%   Weight:      Height:        Intake/Output Summary (Last 24 hours) at 11/27/2018 0917 Last data filed at 11/26/2018 1600 Gross per 24 hour  Intake 124.34 ml  Output --  Net 124.34 ml   Filed Weights   11/26/18 0440  Weight: 86.7 kg    Examination:  General exam: Appears calm and comfortable, on room air.  Eating breakfast in the morning. Respiratory system: Clear to auscultation. Respiratory effort normal.  No added sounds. Cardiovascular system: S1 & S2 heard, RRR. No JVD, murmurs, rubs, gallops or clicks. No pedal edema. Gastrointestinal system: Abdomen is nondistended, soft and nontender. No organomegaly or masses felt. Normal bowel sounds heard. Central nervous system: Alert and oriented.  Cranial nerve with left hemianopia. Extremities: Right upper and lower extremity, 5/5 Left upper extremity 4/5, left lower extremity 4/5 distal muscle group, 3/5 proximal hip. Skin: No rashes, lesions or ulcers  Psychiatry: Judgement and insight appear normal. Mood & affect appropriate.     Data Reviewed: I have personally reviewed following labs and imaging studies  CBC: Recent Labs  Lab 11/25/18 1716 11/25/18 1750 11/27/18 0331  WBC 11.2*  --  9.5  NEUTROABS 9.3*  --   --   HGB 17.0 17.3* 14.0  HCT 53.8* 51.0 42.6  MCV 99.4  --  96.4  PLT 184  --   162   Basic Metabolic Panel: Recent Labs  Lab 11/25/18 1716 11/25/18 1750  NA 141 142  K 3.8 4.4  CL 102 104  CO2 24  --   GLUCOSE 109* 107*  BUN 13 18  CREATININE 1.24 1.10  CALCIUM 9.3  --    GFR: Estimated Creatinine Clearance: 48.9 mL/min (by C-G formula based on SCr of 1.1 mg/dL). Liver Function Tests: Recent Labs  Lab 11/25/18 1716  AST 48*  ALT 26  ALKPHOS 69  BILITOT 1.6*  PROT 7.9  ALBUMIN 4.0   No results for input(s): LIPASE, AMYLASE in the last 168 hours. No results for input(s): AMMONIA in the last 168 hours. Coagulation Profile: Recent Labs  Lab 11/26/18 0353  INR 1.1   Cardiac Enzymes: Recent Labs  Lab 11/25/18 1716  CKTOTAL 1,650*   BNP (last 3 results) No results for input(s): PROBNP in the last 8760 hours. HbA1C: Recent Labs    11/26/18 0353  HGBA1C 6.4*   CBG: No results for input(s): GLUCAP in the last 168 hours. Lipid Profile: Recent Labs    11/26/18 0353  CHOL 78  HDL 46  LDLCALC 29  TRIG 16  CHOLHDL 1.7   Thyroid Function Tests: No results for input(s): TSH, T4TOTAL, FREET4, T3FREE, THYROIDAB in the last 72 hours. Anemia Panel: No results for input(s): VITAMINB12, FOLATE, FERRITIN, TIBC, IRON, RETICCTPCT in the last 72 hours. Sepsis Labs: No results for input(s): PROCALCITON, LATICACIDVEN in the last 168 hours.  Recent Results (from the past 240 hour(s))  SARS CORONAVIRUS 2 (TAT 6-24 HRS) Nasopharyngeal Nasopharyngeal Swab     Status: None   Collection Time: 11/25/18  5:18 PM   Specimen: Nasopharyngeal Swab  Result Value Ref Range Status   SARS Coronavirus 2 NEGATIVE NEGATIVE Final    Comment: (NOTE) SARS-CoV-2 target nucleic acids are NOT DETECTED. The SARS-CoV-2 RNA is generally detectable in upper and lower respiratory specimens during the acute phase of infection. Negative results do not preclude SARS-CoV-2 infection, do not rule out co-infections with other pathogens, and should not be used as the sole basis  for treatment or other patient management decisions. Negative results must be combined with clinical observations, patient history, and epidemiological information. The expected result is Negative. Fact Sheet for Patients: HairSlick.no Fact Sheet for Healthcare Providers: quierodirigir.com This test is not yet approved or cleared by the [place] FDA and  has been authorized for detection and/or diagnosis of SARS-CoV-2 by FDA under an Emergency Use Authorization (EUA). This EUA will remain  in effect (meaning this test can be used) for the duration of the COVID-19 declaration under Section 56 4(b)(1) of the Act, 21 U.S.C. section 360bbb-3(b)(1), unless the authorization is terminated or revoked sooner. Performed at [facility], [address] [place], [place] [address]          Radiology Studies: Ct Angio Head W Or Wo Contrast  Result Date: 11/25/2018 CLINICAL DATA:  Stroke follow-up EXAM: CT ANGIOGRAPHY HEAD AND NECK TECHNIQUE: Multidetector CT imaging of the head and neck was  performed using the standard protocol during bolus administration of intravenous contrast. Multiplanar CT image reconstructions and MIPs were obtained to evaluate the vascular anatomy. Carotid stenosis measurements (when applicable) are obtained utilizing NASCET criteria, using the distal internal carotid diameter as the denominator. CONTRAST:  158mL OMNIPAQUE IOHEXOL 350 MG/ML SOLN COMPARISON:  Head CT 11/25/2018 FINDINGS: CTA NECK FINDINGS SKELETON: There is no bony spinal canal stenosis. No lytic or blastic lesion. OTHER NECK: Normal pharynx, larynx and major salivary glands. No cervical lymphadenopathy. Unremarkable thyroid gland. UPPER CHEST: No pneumothorax or pleural effusion. No nodules or masses. AORTIC ARCH AND PULMONARY ARTERIES: There is a filling defect within the right main pulmonary artery and the left upper lobar artery. There is mild  calcific atherosclerosis of the aortic arch. There is no aneurysm, dissection or hemodynamically significant stenosis of the visualized portion of the aorta. Normal variant aortic arch branching pattern with the left vertebral artery arising independently from the aortic arch. The visualized proximal subclavian arteries are widely patent. RIGHT CAROTID SYSTEM: Normal without aneurysm, dissection or stenosis. LEFT CAROTID SYSTEM: Normal without aneurysm, dissection or stenosis. VERTEBRAL ARTERIES: Right dominant configuration. Both origins are clearly patent. There is no dissection, occlusion or flow-limiting stenosis to the skull base (V1-V3 segments). CTA HEAD FINDINGS POSTERIOR CIRCULATION: --Vertebral arteries: Normal V4 segments. --Posterior inferior cerebellar arteries (PICA): Patent origins from the vertebral arteries. --Anterior inferior cerebellar arteries (AICA): Patent origins from the basilar artery. --Basilar artery: Normal. --Superior cerebellar arteries: Normal. --Posterior cerebral arteries: The proximal P2 segment of the right PCA is occluded. The left PCA is normal. Posterior communicating arteries are diminutive or absent. ANTERIOR CIRCULATION: --Intracranial internal carotid arteries: Normal. --Anterior cerebral arteries (ACA): Normal. Both A1 segments are present. Patent anterior communicating artery (a-comm). --Middle cerebral arteries (MCA): Normal. VENOUS SINUSES: As permitted by contrast timing, patent. ANATOMIC VARIANTS: None Review of the MIP images confirms the above findings. IMPRESSION: 1. Occlusion of the proximal P2 segment of the right PCA. No other intracranial arterial occlusion or high-grade stenosis. 2. Acute pulmonary emboli within the right main pulmonary artery and left upper lobar artery. 3. Aortic Atherosclerosis (ICD10-I70.0). Critical Value/emergent results were called by telephone at the time of interpretation on 11/25/2018 at 9:52 pm to [place] , who  verbally acknowledged these results. Electronically Signed   By: Ulyses Jarred M.D.   On: 11/25/2018 22:07   Ct Head Wo Contrast  Result Date: 11/25/2018 CLINICAL DATA:  Left arm weakness EXAM: CT HEAD WITHOUT CONTRAST TECHNIQUE: Contiguous axial images were obtained from the base of the skull through the vertex without intravenous contrast. COMPARISON:  None. FINDINGS: Brain: There is focal hypodensity of the posterolateral right thalamus and adjacent cerebral pedicle (series 3, image 17) in addition to the inferior right occipital lobe (series 3, image 12, series 5, image 50). There is extensive underlying periventricular and deep white matter hypodensity. Nonacute left PCA territory occipital encephalomalacia (series 3, image 16). Vascular: No hyperdense vessel or unexpected calcification. Skull: Normal. Negative for fracture or focal lesion. Sinuses/Orbits: No acute finding. Other: None. IMPRESSION: 1. There is focal hypodensity of the posterolateral right thalamus and adjacent cerebral pedicle (series 3, image 17) in addition to the inferior right occipital lobe (series 3, image 12, series 5, image 50). Findings are suspicious for acute to subacute edematous PCA territory infarction. MRI may be used to more sensitively assess for acute diffusion restricting infarction if desired. 2. Underlying small-vessel white matter disease and nonacute left PCA territory encephalomalacia. Electronically Signed  By: Lauralyn Primes M.D.   On: 11/25/2018 18:12   Ct Angio Neck W Or Wo Contrast  Result Date: 11/25/2018 CLINICAL DATA:  Stroke follow-up EXAM: CT ANGIOGRAPHY HEAD AND NECK TECHNIQUE: Multidetector CT imaging of the head and neck was performed using the standard protocol during bolus administration of intravenous contrast. Multiplanar CT image reconstructions and MIPs were obtained to evaluate the vascular anatomy. Carotid stenosis measurements (when applicable) are obtained utilizing NASCET criteria, using  the distal internal carotid diameter as the denominator. CONTRAST:  OMNIPAQUE IOHEXOL 350 MG/ML SOLN COMPARISON:  Head CT 11/25/2018 FINDINGS: CTA NECK FINDINGS SKELETON: There is no bony spinal canal stenosis. No lytic or blastic lesion. OTHER NECK: Normal pharynx, larynx and major salivary glands. No cervical lymphadenopathy. Unremarkable thyroid gland. UPPER CHEST: No pneumothorax or pleural effusion. No nodules or masses. AORTIC ARCH AND PULMONARY ARTERIES: There is a filling defect within the right main pulmonary artery and the left upper lobar artery. There is mild calcific atherosclerosis of the aortic arch. There is no aneurysm, dissection or hemodynamically significant stenosis of the visualized portion of the aorta. Normal variant aortic arch branching pattern with the left vertebral artery arising independently from the aortic arch. The visualized proximal subclavian arteries are widely patent. RIGHT CAROTID SYSTEM: Normal without aneurysm, dissection or stenosis. LEFT CAROTID SYSTEM: Normal without aneurysm, dissection or stenosis. VERTEBRAL ARTERIES: Right dominant configuration. Both origins are clearly patent. There is no dissection, occlusion or flow-limiting stenosis to the skull base (V1-V3 segments). CTA HEAD FINDINGS POSTERIOR CIRCULATION: --Vertebral arteries: Normal V4 segments. --Posterior inferior cerebellar arteries (PICA): Patent origins from the vertebral arteries. --Anterior inferior cerebellar arteries (AICA): Patent origins from the basilar artery. --Basilar artery: Normal. --Superior cerebellar arteries: Normal. --Posterior cerebral arteries: The proximal P2 segment of the right PCA is occluded. The left PCA is normal. Posterior communicating arteries are diminutive or absent. ANTERIOR CIRCULATION: --Intracranial internal carotid arteries: Normal. --Anterior cerebral arteries (ACA): Normal. Both A1 segments are present. Patent anterior communicating artery (a-comm). --Middle  cerebral arteries (MCA): Normal. VENOUS SINUSES: As permitted by contrast timing, patent. ANATOMIC VARIANTS: None Review of the MIP images confirms the above findings. IMPRESSION: 1. Occlusion of the proximal P2 segment of the right PCA. No other intracranial arterial occlusion or high-grade stenosis. 2. Acute pulmonary emboli within the right main pulmonary artery and left upper lobar artery. 3. Aortic Atherosclerosis (ICD10-I70.0). Critical Value/emergent results were called by telephone at the time of interpretation on 11/25/2018 at 9:52 pm to [facility] , who verbally acknowledged these results. Electronically Signed   By: Deatra Robinson M.D.   On: 11/25/2018 22:07   Mr Brain Wo Contrast  Result Date: 11/26/2018 CLINICAL DATA:  Stroke. EXAM: MRI HEAD WITHOUT CONTRAST TECHNIQUE: Multiplanar, multiecho pulse sequences of the brain and surrounding structures were obtained without intravenous contrast. COMPARISON:  CT head and CTA head and neck 11/25/2018 FINDINGS: Brain: Acute infarct right PCA territory. Restricted diffusion in the right thalamus as well as the right posterior hippocampus and inferior medial occipital lobe. Generalized atrophy. Chronic ischemic change in the white matter. Chronic infarcts in the cerebellum bilaterally. Small chronic infarct left thalamus. Small chronic hemorrhage right posterolateral temporal lobe. Chronic hemorrhage in the left cerebellum. No mass lesion. Ventricle size normal. No midline shift. Mild atrophy. Vascular: Normal arterial flow voids Skull and upper cervical spine: Negative Sinuses/Orbits: Paranasal sinuses clear.  Bilateral cataract surgery Other: None IMPRESSION: Acute infarct right PCA territory without hemorrhage. Atrophy and chronic ischemic change as above. Chronic  hemorrhage right posterolateral temporal lobe and left cerebellum. Electronically Signed   By: Marlan Palau M.D.   On: 11/26/2018 12:51   Dg Chest Portable 1 View  Result  Date: 11/25/2018 CLINICAL DATA:  Cough, hypoxia EXAM: PORTABLE CHEST 1 VIEW COMPARISON:  03/05/2018 FINDINGS: Cardiomegaly, vascular congestion. Right lung clear. Left lower lobe opacity, likely atelectasis. No overt edema or visible effusions. No acute bony abnormality. IMPRESSION: Cardiomegaly, vascular congestion. Left base opacity, favor atelectasis. Electronically Signed   By: Charlett Nose M.D.   On: 11/25/2018 19:38   Dg Ankle Left Port  Result Date: 11/26/2018 CLINICAL DATA:  Ankle pain EXAM: PORTABLE LEFT ANKLE -3 VIEW COMPARISON:  None. FINDINGS: Tarsal degenerative changes are noted. Calcaneal spurring is seen. Soft tissue calcifications are noted beneath the calcaneus medially. No acute fracture or dislocation is noted. IMPRESSION: Soft tissue calcifications beneath the calcaneus medially of uncertain chronicity. Degenerative changes of the tarsal bones. Electronically Signed   By: Alcide Clever M.D.   On: 11/26/2018 03:00   Vas [place] Lower Extremity Venous (dvt)  Result Date: 11/26/2018  Lower Venous Study Indications: Pulmonary embolism.  Comparison Study: no prior Performing Technologist: Blanch Media RVS  Examination Guidelines: A complete evaluation includes B-mode imaging, spectral Doppler, color Doppler, and power Doppler as needed of all accessible portions of each vessel. Bilateral testing is considered an integral part of a complete examination. Limited examinations for reoccurring indications may be performed as noted.  +---------+---------------+---------+-----------+----------+--------------+  RIGHT     Compressibility Phasicity Spontaneity Properties Thrombus Aging  +---------+---------------+---------+-----------+----------+--------------+  CFV       Full            Yes       Yes                                    +---------+---------------+---------+-----------+----------+--------------+  SFJ       Full                                                              +---------+---------------+---------+-----------+----------+--------------+  FV Prox   Full                                                             +---------+---------------+---------+-----------+----------+--------------+  FV Mid    Full                                                             +---------+---------------+---------+-----------+----------+--------------+  FV Distal Full                                                             +---------+---------------+---------+-----------+----------+--------------+  PFV       Full                                                             +---------+---------------+---------+-----------+----------+--------------+  POP       Full            Yes       Yes                                    +---------+---------------+---------+-----------+----------+--------------+  PTV       Full                                                             +---------+---------------+---------+-----------+----------+--------------+  PERO      Full                                                             +---------+---------------+---------+-----------+----------+--------------+   +---------+---------------+---------+-----------+----------+--------------+  LEFT      Compressibility Phasicity Spontaneity Properties Thrombus Aging  +---------+---------------+---------+-----------+----------+--------------+  CFV       Full            Yes       Yes                                    +---------+---------------+---------+-----------+----------+--------------+  SFJ       Full                                                             +---------+---------------+---------+-----------+----------+--------------+  FV Prox   Full                                                             +---------+---------------+---------+-----------+----------+--------------+  FV Mid    Full                                                              +---------+---------------+---------+-----------+----------+--------------+  FV Distal Full                                                             +---------+---------------+---------+-----------+----------+--------------+  PFV       Full                                                             +---------+---------------+---------+-----------+----------+--------------+  POP       Full            Yes       Yes                                    +---------+---------------+---------+-----------+----------+--------------+  PTV       Full                                                             +---------+---------------+---------+-----------+----------+--------------+  PERO      Full                                                             +---------+---------------+---------+-----------+----------+--------------+  No evidence of deep venous thrombosis in the lower extremities.   *See table(s) above for measurements and observations. Electronically signed by Sherald Hess MD on 11/26/2018 at 2:37:27 PM.    Final         Scheduled Meds:   stroke: mapping our early stages of recovery book   Does not apply Once   atorvastatin  20 mg Oral q morning - 10a   multivitamin with minerals  1 tablet Oral q morning - 10a   Continuous Infusions:  heparin 1,200 Units/hr (11/26/18 0506)     LOS: 2 days    Time spent: 25 minutes     Dorcas Carrow, MD [facility] Pager [phone]

## 2018-11-27 NOTE — Progress Notes (Signed)
ANTICOAGULATION CONSULT NOTE - Follow Up Consult  Pharmacy Consult for heparin Indication: pulmonary embolus  No Known Allergies  Patient Measurements: Height: 5\' 10"  (177.8 cm) Weight: 191 lb 2.2 oz (86.7 kg) IBW/kg (Calculated) : 73 Heparin Dosing Weight: 85 kg  Vital Signs: Temp: 98.8 F (37.1 C) (11/16 0742) Temp Source: Oral (11/16 0742) BP: 159/84 (11/16 0800) Pulse Rate: 63 (11/16 0742)  Labs: Recent Labs    11/25/18 1716 11/25/18 1750 11/26/18 0353 11/26/18 0703 11/26/18 1443 11/27/18 0331  HGB 17.0 17.3*  --   --   --  14.0  HCT 53.8* 51.0  --   --   --  42.6  PLT 184  --   --   --   --  162  APTT  --   --  65*  --   --   --   LABPROT  --   --  13.9  --   --   --   INR  --   --  1.1  --   --   --   HEPARINUNFRC  --   --   --  0.43 0.37 0.48  CREATININE 1.24 1.10  --   --   --   --   CKTOTAL 1,650*  --   --   --   --   --     Estimated Creatinine Clearance: 48.9 mL/min (by C-G formula based on SCr of 1.1 mg/dL).   Medical History: Past Medical History:  Diagnosis Date  . Biceps tendonitis 12/24/2010  . 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 Hypertension     Assessment: 85 yo man admitted with CVA, continuing on heparin for PE. Pt did not receive tPA due to being outside of the window. He was not on anticoagulation PTA.  Heparin level remains therapeutic for lower goal at 0.48. No active bleed issues documented. CBC wnl.  Goal of Therapy:  Heparin level 0.3-0.5 units/ml Monitor platelets by anticoagulation protocol: Yes   Plan:  Continue heparin at 1200 units/hr No boluses with acute CVA Monitor daily heparin level and CBC, s/sx bleeding   Elicia Lamp, PharmD, BCPS Please check AMION for all [place] contact numbers Clinical Pharmacist 11/27/2018 1:33 PM

## 2018-11-27 NOTE — Consult Note (Signed)
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 length of stay to reach the above functional goals is: 2 weeks 9. Anticipated discharge destination: Home 10. Overall Rehab/Functional Prognosis: excellent  RECOMMENDATIONS: This patient's condition is appropriate for continued rehabilitative care in the following setting: CIR Patient has agreed to participate in recommended program. Yes Note that insurance prior authorization may be required for reimbursement for recommended care.  Comment: Jeff Lamb would be an excellent candidate for acute rehabilitation. He lives alone and would benefit from rehabilitation to help him gain independence and maximize safely prior to returning home.   Horton Chin, MD 11/27/2018

## 2018-11-27 NOTE — Progress Notes (Signed)
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  CTA chest + PE starting heparin 10/14 PMH CVA HTN ventral hernia HOH    Clinical Impression   PTA, pt lived alone in one level apartment and was independent for all ADLs and IADLs. Pt currently presenting with decreased strength, balance, activity tolerance, cognition, L hemiparesis/hemiplegia, L inattention, and increased pain. Pt presenting with increased motivation to return to independence. Pt performed UB ADLs with min guard A- min A, LB ADLs with max A +2, and functional transfers with max A +2. Pt presented with increased anxiety and emotional lability, becoming tearful with idea of standing. Pt demonstrated poor processing and initiation to follow commands. Pt would benefit from continued OT acutely to address deficits and facilitate safe dc. Due to prior level of independence and motivation, recommend dc to CIR to increase safe performance of ADLs.    Follow Up Recommendations  CIR;Supervision/Assistance - 24 hour    Equipment Recommendations  Other (comment)(defer to next venue)    Recommendations for Other Services PT consult;Rehab consult     Precautions / Restrictions Precautions Precautions: Fall Precaution Comments: Restrictions Weight Bearing Restrictions: No      Mobility Bed Mobility Overal bed mobility: Needs Assistance Bed Mobility: Supine to Sit     Supine to sit: Mod assist;HOB elevated Sit to supine: Mod assist   General bed mobility comments: Pt had diffuclty using LLE and LUE to assist with bed mobility. Pt able to bring feet off EOB, required mod A to scoot hips to EOB.   Transfers Overall transfer level: Needs assistance Equipment used: 2 person hand held assist Transfers: Sit to/from [employer] Sit to Stand: Min assist;+2 physical assistance;+2 safety/equipment Stand pivot transfers: Max assist;+2 physical assistance;+2 safety/equipment       General transfer comment: Pt initially required total A +2, and was unable to fully stand. During second attempt, required min A +2 and elevated surface to power up to standing, but max A +2 to maintain standing due to immediate fatigue and L lateral lean. Pt required max A +2 for pivot to recliner    Balance Overall balance assessment: Needs assistance Sitting-balance support: Feet supported;Single extremity supported Sitting balance-Leahy Scale: Poor Sitting balance - Comments: Pt with L lateral lean and posterior lean. Required external support to maintain upright sitting Postural control: Posterior lean;Left lateral lean Standing balance support: No upper extremity supported;During functional activity Standing balance-Leahy Scale: Zero Standing balance comment: Pt requires max A +2 and L knee block to maintain standing balance                           ADL either performed or assessed with clinical judgement   ADL Overall ADL's : Needs assistance/impaired Eating/Feeding: Set up;Sitting   Grooming: Set up;Minimal assistance;Sitting   Upper Body Bathing: Moderate assistance;Sitting   Lower Body Bathing: Maximal assistance;+2 for physical assistance;+2 for safety/equipment;Sit to/from stand   Upper Body Dressing : Moderate assistance;Sitting   Lower Body Dressing: Maximal assistance;+2 for physical assistance;+2 for safety/equipment;Sit to/from stand Lower Body Dressing Details (indicate cue type and reason): Pt requires max A to don socks, max A +2 for sit<>stand Toilet Transfer: Maximal assistance;+2 for physical assistance;+2 for safety/equipment;Stand-pivot;Minimal assistance(simulated to recliner) Toilet Transfer Details (indicate cue type and reason):  Pt required min A +2 to power up to stand, fatigued  quickly with L lateral lean immediately upon standing, required max A +2 to maintain standing and to pivot to recliner.  Toileting- Clothing Manipulation and Hygiene: Maximal assistance;+2 for physical assistance;+2 for safety/equipment;Sit to/from stand       Functional mobility during ADLs: Minimal assistance;Maximal assistance;+2 for physical assistance;+2 for safety/equipment General ADL Comments: Pt required min A- max A for UB ADLs, max A +2 for LB ADLs and functional mobility. Required min A +2 to power up to stand, and max A +2 to maintain standing     Vision   Vision Assessment?: Yes Eye Alignment: Within Functional Limits Alignment/Gaze Preference: Gaze right Tracking/Visual Pursuits: Decreased smoothness of horizontal tracking;Decreased smoothness of vertical tracking;Requires cues, head turns, or add eye shifts to track Convergence: Impaired - to be further tested in functional context Visual Fields: Left visual field deficit Additional Comments: Pt had decreased smoothness of tracking, L inattention, and L visual field deficits. Pt required cues and increased time to locate items in midline and in L visual field. Pt's phone placed on tray table in midline in front of pt, pt did not tract to midline to locate phone; required cues to find phone.     Perception     Praxis      Pertinent Vitals/Pain Pain Assessment: Faces Faces Pain Scale: Hurts even more Pain Location: L leg and L arm Pain Descriptors / Indicators: Constant;Discomfort;Grimacing;Moaning;Sore Pain Intervention(s): Limited activity within patient's tolerance;Monitored during session;Repositioned     Hand Dominance Right   Extremity/Trunk Assessment Upper Extremity Assessment Upper Extremity Assessment: LUE deficits/detail LUE Deficits / Details: Pt presenting with L inattention, limited active movement of LUE. Limited thumb opposition.  LUE Coordination: decreased fine motor;decreased gross motor   Lower  Extremity Assessment Lower Extremity Assessment: Defer to PT evaluation LLE Deficits / Details: weakness 3/5 grossly LLE Sensation: decreased light touch LLE Coordination: decreased gross motor   Cervical / Trunk Assessment Cervical / Trunk Assessment: Normal   Communication Communication Communication: HOH   Cognition Arousal/Alertness: Awake/alert Behavior During Therapy: Anxious(pt tearful, reporting inability to stand. Pain in L leg. ) Overall Cognitive Status: Impaired/Different from baseline Area of Impairment: Problem solving;Awareness;Following commands                       Following Commands: Follows one step commands with increased time;Follows one step commands inconsistently   Awareness: Intellectual Problem Solving: Slow processing;Decreased initiation;Requires verbal cues;Requires tactile cues;Difficulty sequencing General Comments: Pt perseverating on L arm and leg pain, became tearful and anxious regarding standing. Pt HOH, but responded to some questions appropriately. Pt demonstrated difficulty following simple commands, and difficulty processing and initiating movement to follow commands.    General Comments  VSS    Exercises     Shoulder Instructions      Home Living Family/patient expects to be discharged to:: Private residence Living Arrangements: Alone Available Help at Discharge: Family Type of Home: Apartment Home Access: Stairs to enter Technical brewer of Steps: 1   Home Layout: One level     Bathroom Shower/Tub: Teacher, early years/pre: Standard     Home Equipment: None          Prior Functioning/Environment Level of Independence: Independent        Comments: Pt was independent for all ADLs and IADLs        OT Problem List: Decreased strength;Decreased range of motion;Decreased activity tolerance;Impaired balance (  sitting and/or standing);Impaired vision/perception;Decreased coordination;Decreased  cognition;Decreased safety awareness;Decreased knowledge of use of DME or AE;Decreased knowledge of precautions;Impaired UE functional use;Pain      OT Treatment/Interventions: Self-care/ADL training;DME and/or AE instruction;Cognitive remediation/compensation;Therapeutic activities;Visual/perceptual remediation/compensation;Patient/family education;Balance training    OT Goals(Current goals can be found in the care plan section) Acute Rehab OT Goals Patient Stated Goal: get stronger OT Goal Formulation: With patient Time For Goal Achievement: 12/11/18 Potential to Achieve Goals: Good  OT Frequency: Min 2X/week   Barriers to D/C:            Co-evaluation              AM-PAC OT "6 Clicks" Daily Activity     Outcome Measure Help from another person eating meals?: None Help from another person taking care of personal grooming?: A Little Help from another person toileting, which includes using toliet, bedpan, or urinal?: A Lot Help from another person bathing (including washing, rinsing, drying)?: A Lot Help from another person to put on and taking off regular upper body clothing?: A Lot Help from another person to put on and taking off regular lower body clothing?: A Lot 6 Click Score: 15   End of Session Equipment Utilized During Treatment: Gait belt Nurse Communication: Mobility status  Activity Tolerance: Patient tolerated treatment well Patient left: in chair;with call bell/phone within reach;with chair alarm set  OT Visit Diagnosis: Unsteadiness on feet (R26.81);Other abnormalities of gait and mobility (R26.89);Muscle weakness (generalized) (M62.81);Low vision, both eyes (H54.2);Other symptoms and signs involving cognitive function;Pain;Hemiplegia and hemiparesis Hemiplegia - Right/Left: Left Hemiplegia - dominant/non-dominant: Non-Dominant Hemiplegia - caused by: Cerebral infarction Pain - Right/Left: Left Pain - part of body: Arm;Leg                Time:  [id number] OT Time Calculation (min): 28 min Charges:  OT General Charges $OT Visit: 1 Visit OT Evaluation $OT Eval Moderate Complexity: 1 Mod OT Treatments $Self Care/Home Management : 8-22 mins  Sandrea HammondJoAnna Tabathia Knoche, OT Student  Mardene CelesteJoAnna Kesia Dalto 11/27/2018, 1:05 PM

## 2018-11-27 NOTE — Evaluation (Signed)
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  CTA chest + PE starting heparin 10/14 PMH CVA HTN ventral hernia HOH   Clinical Impression  Patient received in bed, alert, oriented. Agrees to PT assessment. Patient weak on both Left UE and LE. Decreased sensation reported. Patient requires mod assist for supine to sit. Leaning forward while sitting. Attempted to stand and patient unable to get fully upright, left knee buckling some and patient returned to sitting on side of bed. Requires mod assist to get back into bed and get positioned in bed. Patient will continue to benefit from skilled PT to improve strength, and functional independence.       Follow Up Recommendations CIR    Equipment Recommendations  None recommended by PT(TBD)    Recommendations for Other Services Rehab consult     Precautions / Restrictions Precautions Precautions: Fall Precaution Comments: PE Restrictions Weight Bearing Restrictions: No      Mobility  Bed Mobility Overal bed mobility: Needs Assistance Bed Mobility: Supine to Sit;Sit to Supine     Supine to sit: Mod assist Sit to supine: Mod assist   General bed mobility comments: difficulty using L UE and LE to assist with bed mobility.  Transfers Overall transfer level: Needs assistance Equipment used: Rolling walker (2 wheeled) Transfers: Sit to/from Stand Sit to Stand: Max assist         General transfer comment: unable to get fully standing. L knee weak upon standing.  Ambulation/Gait             General Gait Details: unable  Stairs            Wheelchair Mobility    Modified Rankin (Stroke Patients Only) Modified Rankin (Stroke Patients Only) Pre-Morbid Rankin Score: No significant disability Modified Rankin: Moderately severe  disability     Balance Overall balance assessment: Needs assistance Sitting-balance support: Feet supported;Single extremity supported Sitting balance-Leahy Scale: Poor     Standing balance support: During functional activity;Single extremity supported Standing balance-Leahy Scale: Poor                               Pertinent Vitals/Pain Pain Assessment: No/denies pain    Home Living Family/patient expects to be discharged to:: Inpatient rehab Living Arrangements: Alone Available Help at Discharge: Family                  Prior Function Level of Independence: Independent               Hand Dominance   Dominant Hand: Right    Extremity/Trunk Assessment   Upper Extremity Assessment Upper Extremity Assessment: Defer to OT evaluation    Lower Extremity Assessment Lower Extremity Assessment: LLE deficits/detail LLE Deficits / Details: weakness 3/5 grossly LLE Sensation: decreased light touch LLE Coordination: decreased gross motor    Cervical / Trunk Assessment Cervical / Trunk Assessment: Normal  Communication   Communication: HOH  Cognition Arousal/Alertness: Awake/alert Behavior During Therapy: WFL for tasks assessed/performed Overall Cognitive Status: Within Functional Limits for tasks assessed                                        General  Comments      Exercises     Assessment/Plan    PT Assessment Patient needs continued PT services  PT Problem List Decreased strength;Decreased mobility;Decreased activity tolerance;Decreased balance;Impaired sensation;Decreased knowledge of use of DME;Decreased coordination;Decreased safety awareness       PT Treatment Interventions Therapeutic activities;DME instruction;Gait training;Therapeutic exercise;Functional mobility training;Balance training;Neuromuscular re-education;Patient/family education    PT Goals (Current goals can be found in the Care Plan section)        Frequency Min 4X/week   Barriers to discharge Decreased caregiver support      Co-evaluation               AM-PAC PT "6 Clicks" Mobility  Outcome Measure Help needed turning from your back to your side while in a flat bed without using bedrails?: A Little Help needed moving from lying on your back to sitting on the side of a flat bed without using bedrails?: A Lot Help needed moving to and from a bed to a chair (including a wheelchair)?: A Lot Help needed standing up from a chair using your arms (e.g., wheelchair or bedside chair)?: A Lot Help needed to walk in hospital room?: Total Help needed climbing 3-5 steps with a railing? : Total 6 Click Score: 11    End of Session Equipment Utilized During Treatment: Gait belt Activity Tolerance: Patient limited by lethargy Patient left: in bed;with bed alarm set;with call bell/phone within reach Nurse Communication: Mobility status PT Visit Diagnosis: Muscle weakness (generalized) (M62.81);Difficulty in walking, not elsewhere classified (R26.2);Hemiplegia and hemiparesis;History of falling (Z91.81) Hemiplegia - Right/Left: Left Hemiplegia - dominant/non-dominant: Non-dominant Hemiplegia - caused by: Cerebral infarction    Time: 0930-0950 PT Time Calculation (min) (ACUTE ONLY): 20 min   Charges:   PT Evaluation $PT Eval Moderate Complexity: 1 Mod PT Treatments $Therapeutic Activity: 8-22 mins        Tykee Heideman, PT, GCS 11/27/18,12:14 PM

## 2018-11-27 NOTE — Plan of Care (Signed)
  Problem: Nutrition: Goal: Risk of aspiration will decrease Outcome: Progressing   

## 2018-11-27 NOTE — Progress Notes (Addendum)
STROKE TEAM PROGRESS NOTE   INTERVAL HISTORY Pt sitting in chair. Still has left hemianopia and left side weakness, arm>leg. On heparin IV. Had TCD bubble study showed [name] at rest, but at least IV with valsalva.    OBJECTIVE Vitals:   11/27/18 0010 11/27/18 0327 11/27/18 0742 11/27/18 0800  BP: (!) 148/70 (!) 146/70 (!) 161/112 (!) 159/84  Pulse: 62 66 63   Resp: 20 20 20    Temp: 98.1 F (36.7 C) 98.2 F (36.8 C) 98.8 F (37.1 C)   TempSrc: Oral Oral Oral   SpO2: 94% 96% 90%   Weight:      Height:        CBC:  Recent Labs  Lab 11/25/18 1716 11/25/18 1750 11/27/18 0331  WBC 11.2*  --  9.5  NEUTROABS 9.3*  --   --   HGB 17.0 17.3* 14.0  HCT 53.8* 51.0 42.6  MCV 99.4  --  96.4  PLT 184  --  161    Basic Metabolic Panel:  Recent Labs  Lab 11/25/18 1716 11/25/18 1750  NA 141 142  K 3.8 4.4  CL 102 104  CO2 24  --   GLUCOSE 109* 107*  BUN 13 18  CREATININE 1.24 1.10  CALCIUM 9.3  --     Lipid Panel:     Component Value Date/Time   CHOL 78 11/26/2018 0353   TRIG 16 11/26/2018 0353   HDL 46 11/26/2018 0353   CHOLHDL 1.7 11/26/2018 0353   VLDL 3 11/26/2018 0353   LDLCALC 29 11/26/2018 0353   HgbA1c:  Lab Results  Component Value Date   HGBA1C 6.4 (H) 11/26/2018   Urine Drug Screen:     Component Value Date/Time   LABOPIA NONE DETECTED 11/25/2018 1812   COCAINSCRNUR NONE DETECTED 11/25/2018 1812   LABBENZ NONE DETECTED 11/25/2018 1812   AMPHETMU NONE DETECTED 11/25/2018 1812   THCU NONE DETECTED 11/25/2018 1812   LABBARB NONE DETECTED 11/25/2018 1812    Alcohol Level     Component Value Date/Time   ETH <10 11/25/2018 1750    IMAGING  Ct Angio Head W Or Wo Contrast Ct Angio Neck W Or Wo Contrast 11/25/2018 IMPRESSION:  1. Occlusion of the proximal P2 segment of the right PCA. No other intracranial arterial occlusion or high-grade stenosis.  2. Acute pulmonary emboli within the right main pulmonary artery and left upper lobar  artery.  3. Aortic Atherosclerosis (ICD10-I70.0).   Ct Head Wo Contrast 11/25/2018 IMPRESSION:  1. There is focal hypodensity of the posterolateral right thalamus and adjacent cerebral pedicle (series 3, image 17) in addition to the inferior right occipital lobe (series 3, image 12, series 5, image 50). Findings are suspicious for acute to subacute edematous PCA territory infarction. MRI may be used to more sensitively assess for acute diffusion restricting infarction if desired.  2. Underlying small-vessel white matter disease and nonacute left PCA territory encephalomalacia.    Mr Brain Wo Contrast Result Date: 11/26/2018 IMPRESSION: Acute infarct right PCA territory without hemorrhage. Atrophy and chronic ischemic change as above. Chronic hemorrhage right posterolateral temporal lobe and left cerebellum.   Vas [place] Lower Extremity Venous (dvt) Result Date: 11/26/2018 No evidence of deep venous thrombosis in the lower extremities.      Transthoracic Echocardiogram   1. Left ventricular ejection fraction, by visual estimation, is 60 to 65%. The left ventricle has normal function. Left ventricular septal wall thickness was moderately increased. Moderately increased left ventricular posterior wall thickness.  There is  no left ventricular hypertrophy.  2. Elevated left ventricular end-diastolic pressure.  3. Global right ventricle has normal systolic function.The right ventricular size is normal. No increase in right ventricular wall thickness.  4. Left atrial size was normal.  5. Right atrial size was normal.  6. The mitral valve is normal in structure. Trace mitral valve regurgitation. No evidence of mitral stenosis.  7. The tricuspid valve is normal in structure. Tricuspid valve regurgitation is mild.  8. The aortic valve is tricuspid. Aortic valve regurgitation is not visualized. Mild aortic valve sclerosis without stenosis.  9. The pulmonic valve was normal in structure. Pulmonic valve  regurgitation is not visualized. 10. There is mild dilatation of the ascending aorta and of the aortic root measuring 38 mm and 4mm respectively. 11. Moderately elevated pulmonary artery systolic pressure. 12. The inferior vena cava is normal in size with greater than 50% respiratory variability, suggesting right atrial pressure of 3 mmHg. 13. Left ventricular diastolic parameters are consistent with Grade I diastolic dysfunction (impaired relaxation).   ECG - SR rate 76 BPM. (See cardiology reading for complete details)   PHYSICAL EXAM    Temp:  [97.6 F (36.4 C)-98.8 F (37.1 C)] 98.8 F (37.1 C) (11/16 0742) Pulse Rate:  [61-66] 63 (11/16 0742) Resp:  [16-20] 20 (11/16 0742) BP: (146-167)/(70-112) 159/84 (11/16 0800) SpO2:  [90 %-99 %] 90 % (11/16 0742)  General - Well nourished, well developed, in no apparent distress.  Ophthalmologic - fundi not visualized due to noncooperation.  Cardiovascular - Regular rhythm and rate.  Mental Status -  Level of arousal and orientation to time, place, and person were intact. Language including expression, naming, repetition, comprehension was assessed and found intact. Fund of Knowledge was assessed and was intact.  Cranial Nerves II - XII - II - left hemianopia. III, IV, VI - Extraocular movements intact. V - Facial sensation intact bilaterally. VII - Facial movement intact bilaterally. VIII - Hearing & vestibular intact bilaterally. X - Palate elevates symmetrically. XI - Chin turning & shoulder shrug intact bilaterally. XII - Tongue protrusion intact.  Motor Strength - The patient's strength was normal in right upper and lower extremities, however, left UE 3/5 proximal and distal, left LE 3/5 proximal, knee flexion and 4+/5 DF/PF and pronator drift was absent.  Bulk was normal and fasciculations were absent.   Motor Tone - Muscle tone was assessed at the neck and appendages and was normal.  Reflexes - The patient's reflexes  were symmetrical in all extremities and he had no pathological reflexes.  Sensory - Light touch, temperature/pinprick were assessed and were decreased light touch at LUE and diminished light touch at LLE    Coordination - The patient had normal movements in the right hand with no ataxia or dysmetria.  Tremor was absent.  Gait and Station - deferred.   ASSESSMENT/PLAN Mr. CAM DAUPHIN is a 85 y.o. male with history of Htn, Hld, and bilateral hearing loss presenting with left-sided weakness. Found to have PE. Now on Heparin IV.  He did not receive IV t-PA due to late presentation (>4.5 hours from time of onset).  Stroke:  Infarcts large right PCA due to occlusion R proximal P2 - likely related to PE due to positive PFO  CT head - suspicious for acute to subacute edematous PCA territory infarction. Underlying small-vessel white matter disease and nonacute left PCA territory encephalomalacia.    MRI head - large right PCA infarct including right occipital and  right thalamus  CTA H&N - Occlusion of the proximal P2 segment of the right PCA. Acute pulmonary emboli within the right main pulmonary artery and left upper lobar artery.   2D Echo - EF 60-65%. No source of embolus   LE venous doppler - no OVT  TCD bubble study positive PFO with [name] at rest but at least IV with valsalva   Loyal JacobsonSars Corona Virus 2 - negative  LDL - 29  HgbA1c - 6.4  UDS - negative  VTE prophylaxis - Heparin IV  aspirin 81 mg daily prior to admission, now on heparin IV. OK to transition to DOAC from neuro standpoint. Continue close neuro check, if neuro changes, stat CT head to rule out hemorrhagic conversion in the brain.    Therapy recommendations:  pending  Disposition:  Pending  Pt can not drive due to hemianopia. He needs to follow up with ophthalmology.   Acute PE  Confirmed on CTA neck  DVT neg  On heparin IV  Unprovoked PE, recommend anticoagulation life long, OK to change to DOAC  from neuro standpoint.   PFO  Positive on TCD bubble study  Likely the cause of stroke in the setting of PE  On IV heparin  Recommend lifeling AC due to unprovoked PE  No PFO closure intervention needed   Hypertension  Home BP meds: HCTZ  Current BP meds: none   Stable . Permissive hypertension (OK if < 180/105) but gradually normalize in 5-7 days  . Resume home HCTZ . Long-term BP goal normotensive  Hyperlipidemia  Home Lipid lowering medication: Lipitor 40 mg daily   LDL 29, goal < 70  Current lipid lowering medication: Lipitor 20 mg daily   Continue statin at discharge  Other Stroke Risk Factors  Advanced age  Former cigarette smoker - quit  ETOH use, advised to drink no more than 1 alcoholic beverage per day.  Family hx stroke (mother)  Other Active Problems  Leukocytosis - WBC 11.2 (probably 2/2 PE)  Hospital day # 2  Neurology will sign off. Please call with questions. [facility]. Thanks for the consult.   Marvel PlanJindong Dallas Torok, MD PhD Stroke Neurology 11/27/2018 9:51 AM    To contact Stroke Continuity provider, please refer to [email or web address]. After hours, contact General Neurology

## 2018-11-27 NOTE — Progress Notes (Signed)
Inpatient Rehab Admissions:  Met with pt at the bedside for rehabilitation assessment. Pt reports [facility] and appears highly motivated for rehab. Feel pt is a great candidate and would do well in a high intensity therapy environment. However, at this time, pt unsure of assistance at DC. He has a cousin Mel Almond who can provide intermittent IADL assist (cooking, cleaning, running errands), but currently he is unsure if anyone can provide daily physical assistance. Will plan to follow up with pt and his son tomorrow to discuss if there are any other options for assistance at DC. If caregiver support is limited to IADLs, pt will need SNF placement at DC.   Will follow up tomorrow.   Jhonnie Garner, OTR/L  Rehab Admissions Coordinator  [phone] 11/27/2018 5:15 PM

## 2018-11-27 NOTE — Progress Notes (Signed)
TCD bubble study has been completed.   Preliminary results in CV Proc.   Abram Sander 11/27/2018 2:46 PM

## 2018-11-28 LAB — CBC
HCT: 43.5 % (ref 39.0–52.0)
Hemoglobin: 14.3 g/dL (ref 13.0–17.0)
MCH: 31.9 pg (ref 26.0–34.0)
MCHC: 32.9 g/dL (ref 30.0–36.0)
MCV: 97.1 fL (ref 80.0–100.0)
Platelets: 185 10*3/uL (ref 150–400)
RBC: 4.48 MIL/uL (ref 4.22–5.81)
RDW: 12.6 % (ref 11.5–15.5)
WBC: 9.9 10*3/uL (ref 4.0–10.5)
nRBC: 0 % (ref 0.0–0.2)

## 2018-11-28 LAB — HEPARIN LEVEL (UNFRACTIONATED): Heparin Unfractionated: 0.38 IU/mL (ref 0.30–0.70)

## 2018-11-28 MED ORDER — APIXABAN 5 MG PO TABS
10.0000 mg | ORAL_TABLET | Freq: Two times a day (BID) | ORAL | Status: DC
  Administered 2018-11-28: 08:00:00 10 mg via ORAL
  Filled 2018-11-28: qty 2

## 2018-11-28 MED ORDER — POLYETHYLENE GLYCOL 3350 17 G PO PACK
17.0000 g | PACK | Freq: Every day | ORAL | Status: DC | PRN
  Administered 2018-11-28 – 2018-11-30 (×2): 17 g via ORAL
  Filled 2018-11-28 (×2): qty 1

## 2018-11-28 MED ORDER — APIXABAN 5 MG PO TABS
10.0000 mg | ORAL_TABLET | Freq: Two times a day (BID) | ORAL | Status: DC
  Administered 2018-11-28 – 2018-11-30 (×4): 10 mg via ORAL
  Filled 2018-11-28 (×4): qty 2

## 2018-11-28 MED ORDER — APIXABAN 5 MG PO TABS: 5.0000 mg | ORAL_TABLET | Freq: Two times a day (BID) | ORAL | Status: DC

## 2018-11-28 NOTE — Discharge Instructions (Signed)
Information on my medicine - ELIQUIS (apixaban)  This medication education was reviewed with me or my healthcare representative as part of my discharge preparation.  Why was Eliquis prescribed for you? Eliquis was prescribed to treat blood clots that may have been found in the veins of your legs (deep vein thrombosis) or in your lungs (pulmonary embolism) and to reduce the risk of them occurring again.  What do You need to know about Eliquis ? The starting dose is 10 mg (two 5 mg tablets) taken TWICE daily for the FIRST SEVEN (7) DAYS, then on (enter date)  12/05/2018  the dose is reduced to ONE 5 mg tablet taken TWICE daily.  Eliquis may be taken with or without food.   Try to take the dose about the same time in the morning and in the evening. If you have difficulty swallowing the tablet whole please discuss with your pharmacist how to take the medication safely.  Take Eliquis exactly as prescribed and DO NOT stop taking Eliquis without talking to the doctor who prescribed the medication.  Stopping may increase your risk of developing a new blood clot.  Refill your prescription before you run out.  After discharge, you should have regular check-up appointments with your healthcare provider that is prescribing your Eliquis.    What do you do if you miss a dose? If a dose of ELIQUIS is not taken at the scheduled time, take it as soon as possible on the same day and twice-daily administration should be resumed. The dose should not be doubled to make up for a missed dose.  Important Safety Information A possible side effect of Eliquis is bleeding. You should call your healthcare provider right away if you experience any of the following: ? Bleeding from an injury or your nose that does not stop. ? Unusual colored urine (red or dark brown) or unusual colored stools (red or black). ? Unusual bruising for unknown reasons. ? A serious fall or if you hit your head (even if there is no  bleeding).  Some medicines may interact with Eliquis and might increase your risk of bleeding or clotting while on Eliquis. To help avoid this, consult your healthcare provider or pharmacist prior to using any new prescription or non-prescription medications, including herbals, vitamins, non-steroidal anti-inflammatory drugs (NSAIDs) and supplements.  This website has more information on Eliquis (apixaban): http://www.eliquis.com/eliquis/home

## 2018-11-28 NOTE — Progress Notes (Signed)
PROGRESS NOTE    Jeff CoddingJohn Q Lamb  ZOX:[id number] DOB: 02-Oct-1929 DOA: 11/25/2018 PCP: Myrlene Brokerrawford, Elizabeth A, MD    Brief Narrative:  85 year old gentleman with history of TIA with no residual neurological deficit, hypertension, hyperlipidemia was brought to emergency room, found down at home with suspected to stroke for unknown.  Time.  He apparently fell in the morning and was unable to get up.  Was shouting for neighbors who called 911.  In the emergency room he was found with left-sided hemiplegia, CT scan suggestive of right PCA territory stroke.  He was also found to have acute PE on the right pulmonary artery.  Seen by neurology on arrival, outside TPA window.   Assessment & Plan:   Principal Problem:   Acute cerebrovascular accident (CVA) (HCC) Active Problems:   Hypertension   Hyperlipidemia   Pulmonary embolism (HCC)   Prediabetes  Acute right PCA territory ischemic stroke: Clinical findings, left hemiplegia, upper extremity more than lower extremity.  Left hemianopia. CT head findings, right PCA territory infarction. MRI of the brain, right PCA infarction CTA of the head and neck, right PCA occlusion, acute PE within right main pulmonary artery and left upper lobe artery. 2D echocardiogram with normal ejection fraction.  No source of embolus.  Bubble study positive for PFO with [name] to at rest, degree 4 with Valsalva. Antiplatelet therapy, on heparin, changed to Eliquis today. LDL 29, on Lipitor 20 mg at home continued. Hemoglobin A1c, 6.4 suggesting prediabetes, will benefit with Metformin on discharge.  Hypertension: Blood pressure stable.  Permissive hypertension.  Not on treatment at home.  Resumed hydrochlorothiazide.  Acute pulmonary embolism: With no acute cor pulmonale.  On room air.  Unprovoked.  Hemodynamically stable.  On heparin drip.  Stable.  Changed to Eliquis 10 mg twice a day for 7 days, 5 mg twice a day after 7 days.  PFO present with active  thromboembolism in the pulmonary artery and suspected in right PCA.  Lifelong anticoagulation.  Lower extremity duplexes were normal.   Medically stabilizing.  Discharge to skilled nursing facility versus CIR.  DVT prophylaxis: Eliquis Code Status: Full code Family Communication: Son Jeff Lamb on the phone Disposition Plan: Acute inpatient rehab versus SNF depends upon post discharge support availability. Patient is very motivated, he may really benefit with acute inpatient rehab.   Consultants:   Neurology  Procedures:   None  Antimicrobials:   None   Subjective: Patient seen and examined.  No overnight events.  Slightly frustrated with unable to move his left arm.  On room air.  Denies any chest pain or shortness of breath.  Very motivated for rehab and mobility. I updated him about putting him on a blood thinner with Eliquis and stopping heparin. Called and updated patient's son on the phone.  Objective: Vitals:   11/27/18 2000 11/27/18 2352 11/28/18 0346 11/28/18 0801  BP: (!) 159/99 135/81 135/76 (!) 147/87  Pulse: 67 62 (!) 58 (!) 57  Resp: 18 18 19 17   Temp: 98.9 F (37.2 C) 100 F (37.8 C) 98.7 F (37.1 C) 98.7 F (37.1 C)  TempSrc: Oral Oral Oral Oral  SpO2: 95% 96% 97% 94%  Weight:      Height:        Intake/Output Summary (Last 24 hours) at 11/28/2018 1058 Last data filed at 11/27/2018 2000 Gross per 24 hour  Intake 240 ml  Output 375 ml  Net -135 ml   Filed Weights   11/26/18 0440  Weight: 86.7  kg    Examination:  General exam: Appears calm and comfortable, on room air.   Respiratory system: Clear to auscultation. Respiratory effort normal.  No added sounds. Cardiovascular system: S1 & S2 heard, RRR. No JVD, murmurs, rubs, gallops or clicks. No pedal edema. Gastrointestinal system: Abdomen is nondistended, soft and nontender. No organomegaly or masses felt. Normal bowel sounds heard. Central nervous system: Alert and oriented.  Cranial  nerve with left hemianopia. Extremities: Right upper and lower extremity, 5/5 Left upper extremity 4/5 distal, 3/5 proximal. left lower extremity 4/5 distal muscle group, 3/5 proximal hip. Skin: No rashes, lesions or ulcers Psychiatry: Judgement and insight appear normal. Mood & affect appropriate.     Data Reviewed: I have personally reviewed following labs and imaging studies  CBC: Recent Labs  Lab 11/25/18 1716 11/25/18 1750 11/27/18 0331 11/28/18 0344  WBC 11.2*  --  9.5 9.9  NEUTROABS 9.3*  --   --   --   HGB 17.0 17.3* 14.0 14.3  HCT 53.8* 51.0 42.6 43.5  MCV 99.4  --  96.4 97.1  PLT 184  --  162 185   Basic Metabolic Panel: Recent Labs  Lab 11/25/18 1716 11/25/18 1750  NA 141 142  K 3.8 4.4  CL 102 104  CO2 24  --   GLUCOSE 109* 107*  BUN 13 18  CREATININE 1.24 1.10  CALCIUM 9.3  --    GFR: Estimated Creatinine Clearance: 48.9 mL/min (by C-G formula based on SCr of 1.1 mg/dL). Liver Function Tests: Recent Labs  Lab 11/25/18 1716  AST 48*  ALT 26  ALKPHOS 69  BILITOT 1.6*  PROT 7.9  ALBUMIN 4.0   No results for input(s): LIPASE, AMYLASE in the last 168 hours. No results for input(s): AMMONIA in the last 168 hours. Coagulation Profile: Recent Labs  Lab 11/26/18 0353  INR 1.1   Cardiac Enzymes: Recent Labs  Lab 11/25/18 1716  CKTOTAL 1,650*   BNP (last 3 results) No results for input(s): PROBNP in the last 8760 hours. HbA1C: Recent Labs    11/26/18 0353  HGBA1C 6.4*   CBG: No results for input(s): GLUCAP in the last 168 hours. Lipid Profile: Recent Labs    11/26/18 0353  CHOL 78  HDL 46  LDLCALC 29  TRIG 16  CHOLHDL 1.7   Thyroid Function Tests: No results for input(s): TSH, T4TOTAL, FREET4, T3FREE, THYROIDAB in the last 72 hours. Anemia Panel: No results for input(s): VITAMINB12, FOLATE, FERRITIN, TIBC, IRON, RETICCTPCT in the last 72 hours. Sepsis Labs: No results for input(s): PROCALCITON, LATICACIDVEN in the last 168  hours.  Recent Results (from the past 240 hour(s))  SARS CORONAVIRUS 2 (TAT 6-24 HRS) Nasopharyngeal Nasopharyngeal Swab     Status: None   Collection Time: 11/25/18  5:18 PM   Specimen: Nasopharyngeal Swab  Result Value Ref Range Status   SARS Coronavirus 2 NEGATIVE NEGATIVE Final    Comment: (NOTE) SARS-CoV-2 target nucleic acids are NOT DETECTED. The SARS-CoV-2 RNA is generally detectable in upper and lower respiratory specimens during the acute phase of infection. Negative results do not preclude SARS-CoV-2 infection, do not rule out co-infections with other pathogens, and should not be used as the sole basis for treatment or other patient management decisions. Negative results must be combined with clinical observations, patient history, and epidemiological information. The expected result is Negative. Fact Sheet for Patients: HairSlick.no Fact Sheet for Healthcare Providers: quierodirigir.com This test is not yet approved or cleared by the [place]  FDA and  has been authorized for detection and/or diagnosis of SARS-CoV-2 by FDA under an Emergency Use Authorization (EUA). This EUA will remain  in effect (meaning this test can be used) for the duration of the COVID-19 declaration under Section 56 4(b)(1) of the Act, 21 U.S.C. section 360bbb-3(b)(1), unless the authorization is terminated or revoked sooner. Performed at [facility], [place] [address] [place], [place] [address]          Radiology Studies: Mr Brain Wo Contrast  Result Date: 11/26/2018 CLINICAL DATA:  Stroke. EXAM: MRI HEAD WITHOUT CONTRAST TECHNIQUE: Multiplanar, multiecho pulse sequences of the brain and surrounding structures were obtained without intravenous contrast. COMPARISON:  CT head and CTA head and neck 11/25/2018 FINDINGS: Brain: Acute infarct right PCA territory. Restricted diffusion in the right thalamus as well as the right  posterior hippocampus and inferior medial occipital lobe. Generalized atrophy. Chronic ischemic change in the white matter. Chronic infarcts in the cerebellum bilaterally. Small chronic infarct left thalamus. Small chronic hemorrhage right posterolateral temporal lobe. Chronic hemorrhage in the left cerebellum. No mass lesion. Ventricle size normal. No midline shift. Mild atrophy. Vascular: Normal arterial flow voids Skull and upper cervical spine: Negative Sinuses/Orbits: Paranasal sinuses clear.  Bilateral cataract surgery Other: None IMPRESSION: Acute infarct right PCA territory without hemorrhage. Atrophy and chronic ischemic change as above. Chronic hemorrhage right posterolateral temporal lobe and left cerebellum. Electronically Signed   By: Franchot Gallo M.D.   On: 11/26/2018 12:51   Vas [place] Transcranial Doppler W Bubbles  Result Date: 11/28/2018  Transcranial Doppler with Bubble Indications: Stroke and PE. Comparison Study: no prior Performing Technologist: Abram Sander RVS  Examination Guidelines: A complete evaluation includes B-mode imaging, spectral Doppler, color Doppler, and power Doppler as needed of all accessible portions of each vessel. Bilateral testing is considered an integral part of a complete examination. Limited examinations for reoccurring indications may be performed as noted.  Summary:  A vascular evaluation was performed. The right middle cerebral artery was studied. An IV was inserted into the patient's left forearm . Verbal informed consent was obtained.  HITS heard at rest: small. Spencer grade 2 HITS heard during valsalva: monderate to large. Spencer grade 4 Positive TCD Bubble study indicative of moderate to large size right to left shunt *See table(s) above for measurements and observations.  Diagnosing physician: Antony Contras MD Electronically signed by Antony Contras MD on 11/28/2018 at 8:17:31 AM.    Final         Scheduled Meds: .  stroke: mapping our early stages of  recovery book   Does not apply Once  . apixaban  10 mg Oral BID   Followed by  . [START ON 12/05/2018] apixaban  5 mg Oral BID  . atorvastatin  20 mg Oral q morning - 10a  . hydrochlorothiazide  12.5 mg Oral q morning - 10a  . multivitamin with minerals  1 tablet Oral q morning - 10a   Continuous Infusions:    LOS: 3 days    Time spent: 25 minutes     Barb Merino, MD [facility] Pager [phone]

## 2018-11-28 NOTE — Progress Notes (Signed)
Inpatient Rehabilitation-Admissions Coordinator   Spoke with pt's son via phone to discuss therapy recommendations and explore caregiver support. Pt's son [place] and he cannot assist at DC. He would like to speak with other family members this afternoon to see what is possible as that will help determine his post acute therapy venue. Will follow up with pt and his son tomorrow morning. TOC team aware.   Jhonnie Garner, OTR/L  Rehab Admissions Coordinator  [phone] 11/28/2018 3:00 PM

## 2018-11-28 NOTE — Progress Notes (Signed)
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  CTA chest + PE starting heparin 10/14 PMH CVA HTN ventral hernia HOH     PT Comments    Pt progressing with mobility but continues to demonstrate decreased awareness of deficits and positional awareness. Mod A +2 for transfer bed to chair. Worked on sit<>stand from chair as well as pregait activities with RW and mod A +2. L foot inversion and L knee buckling in standing, would benefit from L AFO to better control this. Still recommending CIR as he would benefit from this environment and intensity of rehab. PT will continue to follow.   Follow Up Recommendations  CIR     Equipment Recommendations  None recommended by PT(TBD)    Recommendations for Other Services Rehab consult     Precautions / Restrictions Precautions Precautions: Fall Precaution Comments: PE Restrictions Weight Bearing Restrictions: No    Mobility  Bed Mobility Overal bed mobility: Needs Assistance Bed Mobility: Supine to Sit     Supine to sit: Mod assist     General bed mobility comments: vc's for sequencing, mod HHA for elevation of trunk into full sitting  Transfers Overall transfer level: Needs assistance Equipment used: 2 person hand held assist;Rolling walker (2 wheeled) Transfers: Sit to/from [employer] Sit to Stand: +2 physical assistance;+2 safety/equipment;Mod assist Stand pivot transfers: +2 physical assistance;+2 safety/equipment;Mod assist       General transfer comment: practiced sit<>stand from bed with B HHA, mod A for power up and L knee and foot blocked. Pivoted bed to chair with mod A +2 without AD, pt able to reach for chair arm with RUE to pull self into chair. Then practiced sit<>stand from chair to RW to work on [employer] banker, [employer].  Mod A +2 with mod instructional cues for each stand   Ambulation/Gait Ambulation/Gait assistance: Mod assist;+2 physical assistance   Assistive device: Rolling walker (2 wheeled)       General Gait Details: worked on stepping in place for gait initiation. Pt needed L knee blocked to be able to lift RLE. Did better when shoes donned to decreased L foot inversion in standing. Would benefit from L AFO.   Stairs             Wheelchair Mobility    Modified Rankin (Stroke Patients Only) Modified Rankin (Stroke Patients Only) Pre-Morbid Rankin Score: No significant disability Modified Rankin: Moderately severe disability     Balance Overall balance assessment: Needs assistance Sitting-balance support: Feet supported;Single extremity supported Sitting balance-Leahy Scale: Fair Sitting balance - Comments: able to maintain sitting balance but leans L when performing seated ther ex, vc's for correction and pt able to return to upright Postural control: Left lateral lean Standing balance support: No upper extremity supported;During functional activity Standing balance-Leahy Scale: Poor Standing balance comment: heavy L lean in standing at times                            Cognition Arousal/Alertness: Awake/alert Behavior During Therapy: WFL for tasks assessed/performed Overall Cognitive Status: Impaired/Different from baseline Area of Impairment: Problem solving;Awareness;Following commands;Memory                     Memory: Decreased short-term memory Following Commands: Follows one step commands with increased  time;Follows one step commands inconsistently   Awareness: Intellectual Problem Solving: Slow processing;Decreased initiation;Requires verbal cues;Requires tactile cues;Difficulty sequencing General Comments: pt more emotionally stable today during treatment, needs frequent vc's due to STM deficits.       Exercises General Exercises - Lower  Extremity Long Arc Quad: AROM;Left;10 reps;Seated    General Comments General comments (skin integrity, edema, etc.): VSS      Pertinent Vitals/Pain Pain Assessment: Faces Faces Pain Scale: Hurts little more Pain Location: L leg with knee extension Pain Descriptors / Indicators: Discomfort;Grimacing;Sore Pain Intervention(s): Limited activity within patient's tolerance;Monitored during session    Home Living                      Prior Function            PT Goals (current goals can now be found in the care plan section) Acute Rehab PT Goals Patient Stated Goal: get stronger Progress towards PT goals: Progressing toward goals    Frequency    Min 4X/week      PT Plan Current plan remains appropriate    Co-evaluation              AM-PAC PT "6 Clicks" Mobility   Outcome Measure  Help needed turning from your back to your side while in a flat bed without using bedrails?: A Little Help needed moving from lying on your back to sitting on the side of a flat bed without using bedrails?: A Lot Help needed moving to and from a bed to a chair (including a wheelchair)?: A Lot Help needed standing up from a chair using your arms (e.g., wheelchair or bedside chair)?: A Lot Help needed to walk in hospital room?: Total Help needed climbing 3-5 steps with a railing? : Total 6 Click Score: 11    End of Session Equipment Utilized During Treatment: Gait belt Activity Tolerance: Patient tolerated treatment well Patient left: with call bell/phone within reach;in chair;with chair alarm set Nurse Communication: Mobility status PT Visit Diagnosis: Muscle weakness (generalized) (M62.81);Difficulty in walking, not elsewhere classified (R26.2);Hemiplegia and hemiparesis;History of falling (Z91.81) Hemiplegia - Right/Left: Left Hemiplegia - dominant/non-dominant: Non-dominant Hemiplegia - caused by: Cerebral infarction     Time: 3825-0539 PT Time Calculation (min) (ACUTE  ONLY): 23 min  Charges:  $Gait Training: 8-22 mins $Therapeutic Activity: 8-22 mins                     [place]  Pager [phone] Office [place] 11/28/2018, 1:25 PM

## 2018-11-28 NOTE — TOC Initial Note (Signed)
Transition of Care [facility]) - Initial/Assessment Note    Patient Details  Name: Jeff Lamb MRN: [id number] Date of Birth: [birth year]  Transition of [facility]) CM/SW Contact:    Kermit Balo, RN Phone Number: 11/28/2018, 3:28 PM  Clinical Narrative:                 Recommendations are for CIR. Pt's son is attempting to arrange care after a rehab stay for his father. CM reached out to son about SNF rehab back up and he was in agreement. FL2 completed and patient faxed out to [facility] per son request.  TOC following.  Expected Discharge Plan: IP Rehab Facility Barriers to Discharge: Continued Medical Work up   Patient Goals and CMS Choice   CMS Medicare.gov Compare Post Acute Care list provided to:: Patient Represenative (must comment) Choice offered to / list presented to : Adult Children  Expected Discharge Plan and Services Expected Discharge Plan: IP Rehab Facility In-house Referral: Clinical Social Work Discharge Planning Services: CM Consult   Living arrangements for the past 2 months: Apartment                                      Prior Living Arrangements/Services Living arrangements for the past 2 months: Apartment Lives with:: Self Patient language and need for interpreter reviewed:: Yes Do you feel safe going back to the place where you live?: Yes      Need for Family Participation in Patient Care: Yes (Comment) Care giver support system in place?: No (comment)   Criminal Activity/Legal Involvement Pertinent to Current Situation/Hospitalization: No - Comment as needed  Activities of Daily Living Home Assistive Devices/Equipment: None ADL Screening (condition at time of admission) Patient's cognitive ability adequate to safely complete daily activities?: Yes Is the patient deaf or have difficulty hearing?: Yes Does the patient have difficulty seeing, even when wearing glasses/contacts?: No Does the patient have difficulty concentrating,  remembering, or making decisions?: No Patient able to express need for assistance with ADLs?: Yes Does the patient have difficulty dressing or bathing?: Yes Independently performs ADLs?: No Communication: Independent Dressing (OT): Needs assistance Is this a change from baseline?: Change from baseline, expected to last >3 days Grooming: Needs assistance Is this a change from baseline?: Change from baseline, expected to last >3 days Feeding: Independent Bathing: Needs assistance Is this a change from baseline?: Change from baseline, expected to last >3 days Toileting: Needs assistance Is this a change from baseline?: Change from baseline, expected to last >3days In/Out Bed: Needs assistance Is this a change from baseline?: Change from baseline, expected to last >3 days Walks in Home: Needs assistance Is this a change from baseline?: Change from baseline, expected to last >3 days Does the patient have difficulty walking or climbing stairs?: Yes Weakness of Legs: Left Weakness of Arms/Hands: Both  Permission Sought/Granted                  Emotional Assessment Appearance:: Appears stated age Attitude/Demeanor/Rapport: Engaged Affect (typically observed): Accepting Orientation: : Oriented to Self, Oriented to Place, Oriented to  Time, Oriented to Situation(forgetful)      Admission diagnosis:  Left arm weakness [R29.898] Acute ischemic stroke [facility]) [I63.9] Ankle pain, left [M25.572] Fall, initial encounter [W19.XXXA] Non-traumatic rhabdomyolysis [M62.82] Patient Active Problem List   Diagnosis Date Noted  . Pulmonary embolism (HCC) 11/26/2018  . Prediabetes 11/26/2018  . Acute  cerebrovascular accident (CVA) ([place]) 11/25/2018  . Right shoulder pain 01/17/2018  . Ventral hernia without obstruction or gangrene 03/14/2017  . Cerumen impaction 11/20/2013  . CVA (cerebral infarction) 07/12/2013  . Hyperlipidemia 07/12/2013  . Erectile dysfunction 08/02/2011  . Routine  health maintenance 12/24/2010  . Hypertension    PCP:  Hoyt Koch, MD Pharmacy:   [id number] [address], [place] [address] Phone: [phone] Fax: [phone]     Social Determinants of Health (SDOH) Interventions    Readmission Risk Interventions No flowsheet data found.

## 2018-11-28 NOTE — Plan of Care (Signed)
  Problem: Coping: Goal: Will verbalize positive feelings about self Outcome: Progressing   

## 2018-11-28 NOTE — Plan of Care (Signed)
  Problem: Coping: Goal: Will verbalize positive feelings about self 11/28/2018 1358 by Clint Bolder, RN Outcome: Progressing 11/28/2018 1351 by Clint Bolder, RN Outcome: Progressing

## 2018-11-28 NOTE — Care Management Important Message (Signed)
Important Message  Patient Details  Name: Jeff Lamb MRN: [id number] Date of Birth: Mar 20, 1929   Medicare Important Message Given:  Yes     Elmor Kost 11/28/2018, 11:31 AM

## 2018-11-28 NOTE — NC FL2 (Signed)
[place] MEDICAID FL2 LEVEL OF CARE SCREENING TOOL     IDENTIFICATION  Patient Name: Jeff Lamb Birthdate: [birth year] Sex: male Admission Date (Current Location): 11/25/2018  [facility] and [place] Number:  Producer, television/film/video and Address:  [facility], [address] [place], [place] [address]      Provider Number: [id number]  Attending Physician Name and Address:  Dorcas Carrow, MD  Relative Name and Phone Number:       Current Level of Care: Hospital Recommended Level of Care: Skilled Nursing Facility Prior Approval Number:    Date Approved/Denied:   PASRR Number:  [id number]  Discharge Plan: SNF    Current Diagnoses: Patient Active Problem List   Diagnosis Date Noted  . Pulmonary embolism (HCC) 11/26/2018  . Prediabetes 11/26/2018  . Acute cerebrovascular accident (CVA) (HCC) 11/25/2018  . Right shoulder pain 01/17/2018  . Ventral hernia without obstruction or gangrene 03/14/2017  . Cerumen impaction 11/20/2013  . CVA (cerebral infarction) 07/12/2013  . Hyperlipidemia 07/12/2013  . Erectile dysfunction 08/02/2011  . Routine health maintenance 12/24/2010  . Hypertension     Orientation RESPIRATION BLADDER Height & Weight     Self, Time, Situation, Place(forgetful)  Normal(see d/c summary for oxygen requirements) Incontinent Weight: 86.7 kg Height:  5\' 10"  (177.8 cm)  BEHAVIORAL SYMPTOMS/MOOD NEUROLOGICAL BOWEL NUTRITION STATUS      Continent Diet(Heart healthy)  AMBULATORY STATUS COMMUNICATION OF NEEDS Skin   Extensive Assist Verbally Skin abrasions(abrasion to lt knee/ MASD to bilat perineum)                       Personal Care Assistance Level of Assistance  Bathing, Feeding, Dressing Bathing Assistance: Maximum assistance Feeding assistance: Limited assistance Dressing Assistance: Maximum assistance     Functional Limitations Info  Sight, Speech, Hearing Sight Info: Impaired Hearing Info: Impaired Speech Info:  Adequate    SPECIAL CARE FACTORS FREQUENCY  PT (By licensed PT), OT (By licensed OT), Speech therapy     PT Frequency: 5x/wk OT Frequency: 5x/wk     Speech Therapy Frequency: 5x/wk      Contractures Contractures Info: Not present    Additional Factors Info  Code Status, Allergies Code Status Info: Full Allergies Info: NKA           Current Medications (11/28/2018):  This is the current hospital active medication list Current Facility-Administered Medications  Medication Dose Route Frequency Provider Last Rate Last Dose  .  stroke: mapping our early stages of recovery book   Does not apply Once 11/30/2018, MD      . acetaminophen (TYLENOL) tablet 650 mg  650 mg Oral Q4H PRN Rometta Emery, MD       Or  . acetaminophen (TYLENOL) 160 MG/5ML solution 650 mg  650 mg Per Tube Q4H PRN Rometta Emery, MD       Or  . acetaminophen (TYLENOL) suppository 650 mg  650 mg Rectal Q4H PRN Rometta Emery, MD      . apixaban (ELIQUIS) tablet 10 mg  10 mg Oral BID Rometta Emery, MD       Followed by  . [START ON 12/05/2018] apixaban (ELIQUIS) tablet 5 mg  5 mg Oral BID 12/07/2018, MD      . atorvastatin (LIPITOR) tablet 20 mg  20 mg Oral q morning - 10a Dorcas Carrow, MD   20 mg at 11/28/18 0948  . hydrochlorothiazide (MICROZIDE) capsule 12.5 mg  12.5 mg Oral q morning - 10a Rosalin Hawking, MD   12.5 mg at 11/28/18 0948  . morphine 2 MG/ML injection 2 mg  2 mg Intravenous Q4H PRN Arby Barrette A, NP   2 mg at 11/26/18 1628  . multivitamin with minerals tablet 1 tablet  1 tablet Oral q morning - 10a Rosalin Hawking, MD   1 tablet at 11/28/18 0948  . polyethylene glycol (MIRALAX / GLYCOLAX) packet 17 g  17 g Oral Daily PRN Barb Merino, MD      . senna-docusate (Senokot-S) tablet 1 tablet  1 tablet Oral QHS PRN Elwyn Reach, MD         Discharge Medications: Please see discharge summary for a list of discharge medications.  Relevant Imaging Results:  Relevant  Lab Results:   Additional Information SS#: [id number]  Pollie Friar, RN

## 2018-11-29 ENCOUNTER — Inpatient Hospital Stay (HOSPITAL_COMMUNITY): Payer: Medicare Other

## 2018-11-29 MED ORDER — POLYVINYL ALCOHOL 1.4 % OP SOLN
OPHTHALMIC | Status: DC | PRN
  Administered 2018-11-29: 13:00:00 via OPHTHALMIC
  Filled 2018-11-29: qty 15

## 2018-11-29 MED ORDER — SENNOSIDES-DOCUSATE SODIUM 8.6-50 MG PO TABS
1.0000 | ORAL_TABLET | Freq: Every day | ORAL | Status: DC
  Administered 2018-11-29: 1 via ORAL
  Filled 2018-11-29: qty 1

## 2018-11-29 NOTE — Progress Notes (Signed)
Inpatient Rehabilitation-Admissions Coordinator   Spoke with pt's son Jeff Lamb this morning as follow up from previous discussion regarding caregiver support and disposition. At this time, the pt will not have sufficient caregiver assistance at DC to support an IP Rehab stay. Jeff Lamb states he has discussed the rehab options with his family and they are in agreement for a "longer stay" rehab, such as a SNF. Based on pt's functional deficits and his subtle cognitive deficits, feel SNF is appropriate. I will sign off at this time. TOC team aware.   Please call if questions.   Jhonnie Garner, OTR/L  Rehab Admissions Coordinator  [phone] 11/29/2018 9:55 AM

## 2018-11-29 NOTE — Progress Notes (Signed)
Occupational Therapy Treatment Patient Details Name: Jeff Lamb MRN: [id number] DOB: 05/04/1929 Today's Date: 11/29/2018    History of present illness 85 yo male admitted s/p fall  with R PCA territory infarct and thalamic involvement.  CTA chest + PE starting heparin 10/14 PMH CVA HTN ventral hernia HOH    OT comments  Pt making progress towards OT goals. Pt continues to present with decreased strength, activity tolerance, safety awareness, L hemiparesis, and poor STM and attention. Focused session on increasing activity tolerance and standing balance for performing grooming task at sink. Pt required min A +2 to power up to stand and max A +2 to pivot to recliner. Pt performed face washing at sink with mod A +2 and mod VCs to maintain balance and attend to task. Pt with poor awareness of deficits, and required mod VCs to sequence activities. Continue to recommend CIR to increase safe performance of ADLs. Will follow acutely as admitted.    Follow Up Recommendations  CIR;Supervision/Assistance - 24 hour    Equipment Recommendations  Other (comment)(defer to next venue)    Recommendations for Other Services PT consult;Rehab consult    Precautions / Restrictions Precautions Precautions: Fall Restrictions Weight Bearing Restrictions: No       Mobility Bed Mobility Overal bed mobility: Needs Assistance Bed Mobility: Supine to Sit     Supine to sit: Mod assist     General bed mobility comments: pt required mod A and VCs for sequencing and bringing feet off EOB  Transfers Overall transfer level: Needs assistance Equipment used: Rolling walker (2 wheeled);2 person hand held assist Transfers: Sit to/from [employer] Sit to Stand: Min assist;+2 physical assistance;+2 safety/equipment Stand pivot transfers: Max assist;+2 physical assistance;+2 safety/equipment       General transfer comment: required min A +2 to power up to stand, max A +2 to pivot to  recliner    Balance Overall balance assessment: Needs assistance Sitting-balance support: Feet supported;Single extremity supported Sitting balance-Leahy Scale: Poor Sitting balance - Comments: presenting with L lateral lean, required external support and VCs to maintain upright posture Postural control: Left lateral lean Standing balance support: Single extremity supported;During functional activity Standing balance-Leahy Scale: Poor Standing balance comment: requires mod A +2 to maintain standing balance, L lean                           ADL either performed or assessed with clinical judgement   ADL Overall ADL's : Needs assistance/impaired     Grooming: Wash/dry face;Brushing hair;Moderate assistance;Cueing for sequencing;Cueing for safety;Standing Grooming Details (indicate cue type and reason): Pt required mod A to maintain standing balance at sink, required mod VCs to attend to task                 Toilet Transfer: +2 for physical assistance;+2 for safety/equipment;Stand-pivot;Minimal assistance;Maximal assistance(simulated to recliner) Toilet Transfer Details (indicate cue type and reason): Pt required min A +2 to power up to stand, max A+2 for pivot to recliner         Functional mobility during ADLs: Minimal assistance;Maximal assistance General ADL Comments: Focused session on increasing standing balance and activity tolerance to perform grooming task at sink. Pt performed face washing at sink with mod A +2 to maintain standing balance. Required min A - max A +2 to stand pivot to recliner.      Vision       Perception     Praxis  Cognition Arousal/Alertness: Awake/alert Behavior During Therapy: WFL for tasks assessed/performed Overall Cognitive Status: Impaired/Different from baseline Area of Impairment: Attention;Memory;Following commands;Safety/judgement;Awareness;Problem solving                   Current Attention Level:  Sustained Memory: Decreased short-term memory Following Commands: Follows one step commands with increased time;Follows one step commands inconsistently Safety/Judgement: Decreased awareness of safety;Decreased awareness of deficits Awareness: Intellectual Problem Solving: Slow processing;Decreased initiation;Difficulty sequencing;Requires verbal cues;Requires tactile cues General Comments: Pt demonstrated poor awareness of deficits. pt with tangential speech today as seen by talking about working in [place], and required mod VCs to attend to and sequence grooming task at sink. Pt more emotionally stable during session today.        Exercises     Shoulder Instructions       General Comments VSS    Pertinent Vitals/ Pain       Pain Assessment: Faces Faces Pain Scale: Hurts a little bit Pain Location: L leg Pain Descriptors / Indicators: Discomfort;Grimacing;Sore Pain Intervention(s): Monitored during session;Repositioned  Home Living                                          Prior Functioning/Environment              Frequency  Min 2X/week        Progress Toward Goals  OT Goals(current goals can now be found in the care plan section)  Progress towards OT goals: Progressing toward goals  Acute Rehab OT Goals Patient Stated Goal: get stronger OT Goal Formulation: With patient Time For Goal Achievement: 12/11/18 Potential to Achieve Goals: Good ADL Goals Pt Will Perform Grooming: with min guard assist;standing Pt Will Perform Upper Body Dressing: with supervision;sitting Pt Will Perform Lower Body Dressing: with min guard assist;sit to/from stand Pt Will Transfer to Toilet: with min guard assist;ambulating;bedside commode Pt Will Perform Toileting - Clothing Manipulation and hygiene: with min guard assist;sit to/from stand Additional ADL Goal #1: Pt will demonstrate activity tolerance to stand ~10 minutes in preparation for ADLs. Additional  ADL Goal #2: Pt will perform bed mobility in preparation for ADLs with supervision.  Plan Discharge plan remains appropriate;Frequency remains appropriate    Co-evaluation                 AM-PAC OT "6 Clicks" Daily Activity     Outcome Measure   Help from another person eating meals?: None Help from another person taking care of personal grooming?: A Little Help from another person toileting, which includes using toliet, bedpan, or urinal?: A Lot Help from another person bathing (including washing, rinsing, drying)?: A Lot Help from another person to put on and taking off regular upper body clothing?: A Lot Help from another person to put on and taking off regular lower body clothing?: A Lot 6 Click Score: 15    End of Session Equipment Utilized During Treatment: Gait belt  OT Visit Diagnosis: Unsteadiness on feet (R26.81);Other abnormalities of gait and mobility (R26.89);Muscle weakness (generalized) (M62.81);Low vision, both eyes (H54.2);Other symptoms and signs involving cognitive function;Pain;Hemiplegia and hemiparesis Hemiplegia - Right/Left: Left Hemiplegia - dominant/non-dominant: Non-Dominant Hemiplegia - caused by: Cerebral infarction Pain - Right/Left: Left Pain - part of body: Arm;Leg   Activity Tolerance Patient tolerated treatment well   Patient Left in chair;with call bell/phone within reach;with chair alarm set   Nurse  Communication Mobility status        Time: 1011-1037 OT Time Calculation 1610-9604(min): 26 min  Charges: OT General Charges $OT Visit: 1 Visit OT Treatments $Self Care/Home Management : 23-37 mins  Sandrea HammondJoAnna Jamesetta [place], OT Student  Sandrea HammondJoAnna Isaid Salvia 11/29/2018, 1:19 PM

## 2018-11-29 NOTE — TOC Progression Note (Signed)
Transition of Care [facility]) - Progression Note    Patient Details  Name: Jeff Lamb MRN: [id number] Date of Birth: May 22, 1929  [facility]) CM/SW Contact  Pollie Friar, RN Phone Number: 11/29/2018, 1:54 PM  Clinical Narrative:    CM spoke with patients son and provided him bed offers. He wants to research the offer and will call CM this afternoon. CM submitted for insurance authorization through [facility].  Md asked for new covid test.    Expected Discharge Plan: IP Rehab Facility Barriers to Discharge: Continued Medical Work up  Expected Discharge Plan and Services Expected Discharge Plan: [place] In-house Referral: Clinical Social Work Discharge Planning Services: CM Consult   Living arrangements for the past 2 months: Apartment                                       Social Determinants of Health (SDOH) Interventions    Readmission Risk Interventions No flowsheet data found.

## 2018-11-29 NOTE — Plan of Care (Signed)
Patient progressing towards plan of care goals. 

## 2018-11-29 NOTE — Plan of Care (Signed)
  Problem: Education: Goal: Knowledge of disease or condition will improve Outcome: Progressing Goal: Knowledge of secondary prevention will improve Outcome: Progressing Goal: Knowledge of patient specific risk factors addressed and post discharge goals established will improve Outcome: Progressing Goal: Individualized Educational Video(s) Outcome: Progressing   Problem: Coping: Goal: Will verbalize positive feelings about self Outcome: Progressing Goal: Will identify appropriate support needs Outcome: Progressing   Problem: Health Behavior/Discharge Planning: Goal: Ability to manage health-related needs will improve Outcome: Progressing   Problem: Self-Care: Goal: Ability to participate in self-care as condition permits will improve Outcome: Progressing Goal: Verbalization of feelings and concerns over difficulty with self-care will improve Outcome: Progressing Goal: Ability to communicate needs accurately will improve Outcome: Progressing   Problem: Nutrition: Goal: Risk of aspiration will decrease Outcome: Progressing Goal: Dietary intake will improve Outcome: Progressing   Problem: Ischemic Stroke/TIA Tissue Perfusion: Goal: Complications of ischemic stroke/TIA will be minimized Outcome: Progressing   Problem: Education: Goal: Knowledge of General Education information will improve Description: Including pain rating scale, medication(s)/side effects and non-pharmacologic comfort measures Outcome: Progressing   Problem: Health Behavior/Discharge Planning: Goal: Ability to manage health-related needs will improve Outcome: Progressing   Problem: Clinical Measurements: Goal: Ability to maintain clinical measurements within normal limits will improve Outcome: Progressing Goal: Will remain free from infection Outcome: Progressing Goal: Diagnostic test results will improve Outcome: Progressing Goal: Respiratory complications will improve Outcome: Progressing Goal:  Cardiovascular complication will be avoided Outcome: Progressing   Problem: Activity: Goal: Risk for activity intolerance will decrease Outcome: Progressing   Problem: Nutrition: Goal: Adequate nutrition will be maintained Outcome: Progressing   Problem: Coping: Goal: Level of anxiety will decrease Outcome: Progressing   Problem: Elimination: Goal: Will not experience complications related to bowel motility Outcome: Progressing Goal: Will not experience complications related to urinary retention Outcome: Progressing   Problem: Pain Managment: Goal: General experience of comfort will improve Outcome: Progressing   Problem: Safety: Goal: Ability to remain free from injury will improve Outcome: Progressing   Problem: Skin Integrity: Goal: Risk for impaired skin integrity will decrease Outcome: Progressing   Natoya Viscomi, BSN, RN 

## 2018-11-29 NOTE — Progress Notes (Signed)
PROGRESS NOTE    Jeff Lamb  [id number] DOB: 1929/09/23 DOA: 11/25/2018 PCP: Myrlene Brokerrawford, Elizabeth A, MD    Brief Narrative:  85 year old gentleman with history of TIA with no residual neurological deficit, hypertension, hyperlipidemia was brought to emergency room, found down at home with suspected stroke for unknown time. He apparently fell in the morning and was unable to get up.  Was shouting for neighbors who called 911.  In the emergency room he was found with left-sided hemiplegia, CT scan suggestive of right PCA territory stroke.  He was also found to have acute PE on the right pulmonary artery.  Seen by neurology on arrival, outside TPA window.   Assessment & Plan:   Principal Problem:   Acute cerebrovascular accident (CVA) (HCC) Active Problems:   Hypertension   Hyperlipidemia   Pulmonary embolism (HCC)   Prediabetes  Acute right PCA territory ischemic stroke: Clinical findings, left hemiplegia, upper extremity more than lower extremity.  Left hemianopia. CT head findings, right PCA territory infarction. MRI of the brain, right PCA infarction CTA of the head and neck, right PCA occlusion, acute PE within right main pulmonary artery and left upper lobe artery. 2D echocardiogram with normal ejection fraction.  No source of embolus.  Bubble study positive for PFO with [name] to at rest, degree 4 with Valsalva. Antiplatelet therapy, on heparin, changed to Eliquis now. LDL 29, on Lipitor 20 mg at home continued. Hemoglobin A1c, 6.4 suggesting prediabetes, dietary modification advised.  No indication to start treatment. 11/29/2018: Does not feel well, feels tired and lethargic, no new neurological findings.  Will check CT scan of the head especially patient being on anticoagulation.  Hypertension: Blood pressure stable.  Permissive hypertension.  Not on treatment at home.  Resumed hydrochlorothiazide.  Acute pulmonary embolism: With no acute cor pulmonale.  On room  air.  Unprovoked.  Hemodynamically stable.  Treated with heparin drip.  Change to Eliquis as per PE treatment.    PFO present with active thromboembolism in the pulmonary artery and suspected in right PCA.  Lifelong anticoagulation.  Lower extremity duplexes were normal.   Medically stabilizing.  Discharge to skilled nursing facility versus CIR.  DVT prophylaxis: Eliquis Code Status: Full code Family Communication: Son Jenetta DownerJohn Murtaugh on the phone Disposition Plan: Acute inpatient rehab versus SNF depends upon post discharge support availability. Patient is very motivated, he may really benefit with acute inpatient rehab. Medically clear to transfer today if CT head is normal.    Consultants:   Neurology  Procedures:   None  Antimicrobials:   None   Subjective: Patient seen and examined.  No overnight events.  He is tired today, he just does not feel well and not rested. Complains of not having bowel movement for last 7 days. Could not get good night sleep because of multiple disturbances. Objective: Vitals:   11/28/18 2023 11/28/18 2356 11/29/18 0336 11/29/18 0841  BP: (!) 148/91 139/86 124/84 135/84  Pulse: (!) 54 60 (!) 58 (!) 54  Resp: 18 18 19 18   Temp: 98.8 F (37.1 C) 99.2 F (37.3 C) 98.9 F (37.2 C) 98.6 F (37 C)  TempSrc: Oral Oral Oral Oral  SpO2: 95% 93% 96% 100%  Weight:      Height:        Intake/Output Summary (Last 24 hours) at 11/29/2018 0906 Last data filed at 11/29/2018 0336 Gross per 24 hour  Intake -  Output 725 ml  Net -725 ml   [employer]iled Weights  11/26/18 0440  Weight: 86.7 kg    Examination:  General exam: Appears calm and comfortable, on room air.  Anxious today. Respiratory system: Clear to auscultation. Respiratory effort normal.  No added sounds. Cardiovascular system: S1 & S2 heard, RRR. No JVD, murmurs, rubs, gallops or clicks. No pedal edema. Gastrointestinal system: Abdomen is nondistended, soft and nontender. No organomegaly  or masses felt. Normal bowel sounds heard. Central nervous system: Alert and oriented.  Cranial nerve with left hemianopia. Extremities: Right upper and lower extremity, 5/5 Left upper extremity 4/5 distal, 3/5 proximal. left lower extremity 4/5 distal muscle group, 3/5 proximal hip. Neuro exam not changed from 11/17 Skin: No rashes, lesions or ulcers Psychiatry: Judgement and insight appear normal. Mood & affect anxious.    Data Reviewed: I have personally reviewed following labs and imaging studies  CBC: Recent Labs  Lab 11/25/18 1716 11/25/18 1750 11/27/18 0331 11/28/18 0344  WBC 11.2*  --  9.5 9.9  NEUTROABS 9.3*  --   --   --   HGB 17.0 17.3* 14.0 14.3  HCT 53.8* 51.0 42.6 43.5  MCV 99.4  --  96.4 97.1  PLT 184  --  162 185   Basic Metabolic Panel: Recent Labs  Lab 11/25/18 1716 11/25/18 1750  NA 141 142  K 3.8 4.4  CL 102 104  CO2 24  --   GLUCOSE 109* 107*  BUN 13 18  CREATININE 1.24 1.10  CALCIUM 9.3  --    GFR: Estimated Creatinine Clearance: 48.9 mL/min (by C-G formula based on SCr of 1.1 mg/dL). Liver Function Tests: Recent Labs  Lab 11/25/18 1716  AST 48*  ALT 26  ALKPHOS 69  BILITOT 1.6*  PROT 7.9  ALBUMIN 4.0   No results for input(s): LIPASE, AMYLASE in the last 168 hours. No results for input(s): AMMONIA in the last 168 hours. Coagulation Profile: Recent Labs  Lab 11/26/18 0353  INR 1.1   Cardiac Enzymes: Recent Labs  Lab 11/25/18 1716  CKTOTAL 1,650*   BNP (last 3 results) No results for input(s): PROBNP in the last 8760 hours. HbA1C: No results for input(s): HGBA1C in the last 72 hours. CBG: No results for input(s): GLUCAP in the last 168 hours. Lipid Profile: No results for input(s): CHOL, HDL, LDLCALC, TRIG, CHOLHDL, LDLDIRECT in the last 72 hours. Thyroid Function Tests: No results for input(s): TSH, T4TOTAL, FREET4, T3FREE, THYROIDAB in the last 72 hours. Anemia Panel: No results for input(s): VITAMINB12, FOLATE,  FERRITIN, TIBC, IRON, RETICCTPCT in the last 72 hours. Sepsis Labs: No results for input(s): PROCALCITON, LATICACIDVEN in the last 168 hours.  Recent Results (from the past 240 hour(s))  SARS CORONAVIRUS 2 (TAT 6-24 HRS) Nasopharyngeal Nasopharyngeal Swab     Status: None   Collection Time: 11/25/18  5:18 PM   Specimen: Nasopharyngeal Swab  Result Value Ref Range Status   SARS Coronavirus 2 NEGATIVE NEGATIVE Final    Comment: (NOTE) SARS-CoV-2 target nucleic acids are NOT DETECTED. The SARS-CoV-2 RNA is generally detectable in upper and lower respiratory specimens during the acute phase of infection. Negative results do not preclude SARS-CoV-2 infection, do not rule out co-infections with other pathogens, and should not be used as the sole basis for treatment or other patient management decisions. Negative results must be combined with clinical observations, patient history, and epidemiological information. The expected result is Negative. Fact Sheet for Patients: HairSlick.no Fact Sheet for Healthcare Providers: quierodirigir.com This test is not yet approved or cleared by the [place]  FDA and  has been authorized for detection and/or diagnosis of SARS-CoV-2 by FDA under an Emergency Use Authorization (EUA). This EUA will remain  in effect (meaning this test can be used) for the duration of the COVID-19 declaration under Section 56 4(b)(1) of the Act, 21 U.S.C. section 360bbb-3(b)(1), unless the authorization is terminated or revoked sooner. Performed at [facility], [place] [address] [place], [place] [address]          Radiology Studies: Vas [place] Transcranial Doppler W Bubbles  Result Date: 11/28/2018  Transcranial Doppler with Bubble Indications: Stroke and PE. Comparison Study: no prior Performing Technologist: Abram Sander RVS  Examination Guidelines: A complete evaluation includes B-mode imaging, spectral  Doppler, color Doppler, and power Doppler as needed of all accessible portions of each vessel. Bilateral testing is considered an integral part of a complete examination. Limited examinations for reoccurring indications may be performed as noted.  Summary:  A vascular evaluation was performed. The right middle cerebral artery was studied. An IV was inserted into the patient's left forearm . Verbal informed consent was obtained.  HITS heard at rest: small. Spencer grade 2 HITS heard during valsalva: monderate to large. Spencer grade 4 Positive TCD Bubble study indicative of moderate to large size right to left shunt *See table(s) above for measurements and observations.  Diagnosing physician: Antony Contras MD Electronically signed by Antony Contras MD on 11/28/2018 at 8:17:31 AM.    Final         Scheduled Meds: .  stroke: mapping our early stages of recovery book   Does not apply Once  . apixaban  10 mg Oral BID   Followed by  . [START ON 12/05/2018] apixaban  5 mg Oral BID  . atorvastatin  20 mg Oral q morning - 10a  . hydrochlorothiazide  12.5 mg Oral q morning - 10a  . multivitamin with minerals  1 tablet Oral q morning - 10a  . senna-docusate  1 tablet Oral QHS   Continuous Infusions:    LOS: 4 days    Time spent: 25 minutes     Barb Merino, MD [facility] Pager [phone]

## 2018-11-29 NOTE — Progress Notes (Addendum)
Physical Therapy Treatment Patient Details Name: Jeff Lamb MRN: [id number] DOB: 1929-10-10 Today's Date: 11/29/2018    History of Present Illness 85 yo male admitted s/p fall  with R PCA territory infarct and thalamic involvement.  CTA chest + PE starting heparin 10/14 PMH CVA HTN ventral hernia HOH     PT Comments    Pt progressing slowly and steadily towards physical therapy goals. Session focused on transfer training. Requiring two person minimal assist to power up x 2, however needs up to maximal assist for pivoting and side steps due to increased trunk flexion and difficulty weight shifting to progress left lower extremity. Pt unable to currently hold onto a walker due to left upper extremity weakness. Will benefit from post acute rehab to address deficits and maximize functional independence.     Follow Up Recommendations  SNF     Equipment Recommendations  Other (comment)(TBD)    Recommendations for Other Services       Precautions / Restrictions Precautions Precautions: Fall Restrictions Weight Bearing Restrictions: No    Mobility  Bed Mobility Overal bed mobility: Needs Assistance Bed Mobility: Sit to Supine     Supine to sit: Mod assist Sit to supine: Mod assist   General bed mobility comments: ModA for BLE assist back into bed  Transfers Overall transfer level: Needs assistance Equipment used: 2 person hand held assist Transfers: Sit to/from Stand;Stand Pivot Transfers Sit to Stand: Min assist;+2 physical assistance;+2 safety/equipment Stand pivot transfers: Max assist;+2 physical assistance;+2 safety/equipment       General transfer comment: required min A +2 to power up to stand from recliner and edge of bed, max A +2 to pivot to recliner with cues for weight shifting and upright posture, able to take some side steps with increased trunk flexion  Ambulation/Gait                 Stairs             Wheelchair Mobility    Modified  Rankin (Stroke Patients Only) Modified Rankin (Stroke Patients Only) Pre-Morbid Rankin Score: No significant disability Modified Rankin: Moderately severe disability     Balance Overall balance assessment: Needs assistance Sitting-balance support: Feet supported;Single extremity supported Sitting balance-Leahy Scale: Fair Sitting balance - Comments: presenting with L lateral lean, required external support and VCs to maintain upright posture Postural control: Left lateral lean Standing balance support: Single extremity supported;During functional activity Standing balance-Leahy Scale: Poor Standing balance comment: requires mod A +2 to maintain standing balance, L lean                            Cognition Arousal/Alertness: Awake/alert Behavior During Therapy: WFL for tasks assessed/performed Overall Cognitive Status: Impaired/Different from baseline Area of Impairment: Attention;Memory;Following commands;Safety/judgement;Awareness;Problem solving                   Current Attention Level: Sustained Memory: Decreased short-term memory Following Commands: Follows one step commands with increased time;Follows one step commands inconsistently Safety/Judgement: Decreased awareness of safety;Decreased awareness of deficits Awareness: Intellectual Problem Solving: Slow processing;Decreased initiation;Difficulty sequencing;Requires verbal cues;Requires tactile cues General Comments: Demonstrates poor awareness of deficits, often tangential with speech and not attending to task at hand.       Exercises General Exercises - Lower Extremity Long Arc Quad: AROM;Left;10 reps;Seated    General Comments General comments (skin integrity, edema, etc.): VSS      Pertinent Vitals/Pain Pain Assessment: Faces Faces  Pain Scale: Hurts a little bit Pain Location: L arm Pain Descriptors / Indicators: Grimacing;Sore Pain Intervention(s): Monitored during session;Repositioned     Home Living                      Prior Function            PT Goals (current goals can now be found in the care plan section) Acute Rehab PT Goals Patient Stated Goal: get stronger Progress towards PT goals: Progressing toward goals    Frequency    Min 3X/week      PT Plan Discharge plan needs to be updated    Co-evaluation              AM-PAC PT "6 Clicks" Mobility   Outcome Measure  Help needed turning from your back to your side while in a flat bed without using bedrails?: A Little Help needed moving from lying on your back to sitting on the side of a flat bed without using bedrails?: A Lot Help needed moving to and from a bed to a chair (including a wheelchair)?: A Lot Help needed standing up from a chair using your arms (e.g., wheelchair or bedside chair)?: A Lot Help needed to walk in hospital room?: Total Help needed climbing 3-5 steps with a railing? : Total 6 Click Score: 11    End of Session Equipment Utilized During Treatment: Gait belt Activity Tolerance: Patient tolerated treatment well Patient left: in bed;with call bell/phone within reach;with bed alarm set Nurse Communication: Mobility status PT Visit Diagnosis: Muscle weakness (generalized) (M62.81);Difficulty in walking, not elsewhere classified (R26.2);Hemiplegia and hemiparesis;History of falling (Z91.81) Hemiplegia - Right/Left: Left Hemiplegia - dominant/non-dominant: Non-dominant Hemiplegia - caused by: Cerebral infarction     Time: 5277-8242 PT Time Calculation (min) (ACUTE ONLY): 28 min  Charges:  $Therapeutic Activity: 23-37 mins                     Laurina Bustle, PT, DPT Acute Rehabilitation Services Pager [phone] Office [phone]    Vanetta Mulders 11/29/2018, 3:38 PM

## 2018-11-30 DIAGNOSIS — S199XXA Unspecified injury of neck, initial encounter: Secondary | ICD-10-CM | POA: Diagnosis not present

## 2018-11-30 DIAGNOSIS — Y9301 Activity, walking, marching and hiking: Secondary | ICD-10-CM | POA: Diagnosis not present

## 2018-11-30 DIAGNOSIS — I63431 Cerebral infarction due to embolism of right posterior cerebral artery: Secondary | ICD-10-CM | POA: Diagnosis not present

## 2018-11-30 DIAGNOSIS — Y92129 Unspecified place in nursing home as the place of occurrence of the external cause: Secondary | ICD-10-CM | POA: Diagnosis not present

## 2018-11-30 DIAGNOSIS — E782 Mixed hyperlipidemia: Secondary | ICD-10-CM | POA: Diagnosis not present

## 2018-11-30 DIAGNOSIS — T07XXXA Unspecified multiple injuries, initial encounter: Secondary | ICD-10-CM | POA: Diagnosis present

## 2018-11-30 DIAGNOSIS — Q211 Atrial septal defect: Secondary | ICD-10-CM | POA: Diagnosis not present

## 2018-11-30 DIAGNOSIS — W1830XA Fall on same level, unspecified, initial encounter: Secondary | ICD-10-CM | POA: Diagnosis not present

## 2018-11-30 DIAGNOSIS — M2550 Pain in unspecified joint: Secondary | ICD-10-CM | POA: Diagnosis not present

## 2018-11-30 DIAGNOSIS — S52592A Other fractures of lower end of left radius, initial encounter for closed fracture: Secondary | ICD-10-CM | POA: Diagnosis not present

## 2018-11-30 DIAGNOSIS — R278 Other lack of coordination: Secondary | ICD-10-CM | POA: Diagnosis not present

## 2018-11-30 DIAGNOSIS — R7303 Prediabetes: Secondary | ICD-10-CM | POA: Diagnosis not present

## 2018-11-30 DIAGNOSIS — R29810 Facial weakness: Secondary | ICD-10-CM | POA: Diagnosis not present

## 2018-11-30 DIAGNOSIS — D649 Anemia, unspecified: Secondary | ICD-10-CM | POA: Diagnosis not present

## 2018-11-30 DIAGNOSIS — R519 Headache, unspecified: Secondary | ICD-10-CM | POA: Diagnosis not present

## 2018-11-30 DIAGNOSIS — M6281 Muscle weakness (generalized): Secondary | ICD-10-CM | POA: Diagnosis not present

## 2018-11-30 DIAGNOSIS — Z79899 Other long term (current) drug therapy: Secondary | ICD-10-CM | POA: Diagnosis not present

## 2018-11-30 DIAGNOSIS — I639 Cerebral infarction, unspecified: Secondary | ICD-10-CM | POA: Diagnosis not present

## 2018-11-30 DIAGNOSIS — E785 Hyperlipidemia, unspecified: Secondary | ICD-10-CM | POA: Diagnosis not present

## 2018-11-30 DIAGNOSIS — I69354 Hemiplegia and hemiparesis following cerebral infarction affecting left non-dominant side: Secondary | ICD-10-CM | POA: Diagnosis not present

## 2018-11-30 DIAGNOSIS — M12812 Other specific arthropathies, not elsewhere classified, left shoulder: Secondary | ICD-10-CM | POA: Diagnosis not present

## 2018-11-30 DIAGNOSIS — R69 Illness, unspecified: Secondary | ICD-10-CM | POA: Diagnosis not present

## 2018-11-30 DIAGNOSIS — Y999 Unspecified external cause status: Secondary | ICD-10-CM | POA: Diagnosis not present

## 2018-11-30 DIAGNOSIS — I69352 Hemiplegia and hemiparesis following cerebral infarction affecting left dominant side: Secondary | ICD-10-CM | POA: Diagnosis not present

## 2018-11-30 DIAGNOSIS — Z7901 Long term (current) use of anticoagulants: Secondary | ICD-10-CM | POA: Diagnosis not present

## 2018-11-30 DIAGNOSIS — H04123 Dry eye syndrome of bilateral lacrimal glands: Secondary | ICD-10-CM | POA: Diagnosis not present

## 2018-11-30 DIAGNOSIS — S52502A Unspecified fracture of the lower end of left radius, initial encounter for closed fracture: Secondary | ICD-10-CM | POA: Diagnosis not present

## 2018-11-30 DIAGNOSIS — S0990XA Unspecified injury of head, initial encounter: Secondary | ICD-10-CM | POA: Diagnosis not present

## 2018-11-30 DIAGNOSIS — R531 Weakness: Secondary | ICD-10-CM | POA: Diagnosis not present

## 2018-11-30 DIAGNOSIS — Z7401 Bed confinement status: Secondary | ICD-10-CM | POA: Diagnosis not present

## 2018-11-30 DIAGNOSIS — K5900 Constipation, unspecified: Secondary | ICD-10-CM | POA: Diagnosis not present

## 2018-11-30 DIAGNOSIS — S5012XA Contusion of left forearm, initial encounter: Secondary | ICD-10-CM | POA: Diagnosis not present

## 2018-11-30 DIAGNOSIS — I119 Hypertensive heart disease without heart failure: Secondary | ICD-10-CM | POA: Diagnosis not present

## 2018-11-30 DIAGNOSIS — S0003XA Contusion of scalp, initial encounter: Secondary | ICD-10-CM | POA: Diagnosis not present

## 2018-11-30 DIAGNOSIS — M25512 Pain in left shoulder: Secondary | ICD-10-CM | POA: Diagnosis not present

## 2018-11-30 DIAGNOSIS — I69318 Other symptoms and signs involving cognitive functions following cerebral infarction: Secondary | ICD-10-CM | POA: Diagnosis not present

## 2018-11-30 DIAGNOSIS — E876 Hypokalemia: Secondary | ICD-10-CM | POA: Diagnosis not present

## 2018-11-30 DIAGNOSIS — I10 Essential (primary) hypertension: Secondary | ICD-10-CM | POA: Diagnosis not present

## 2018-11-30 DIAGNOSIS — M25532 Pain in left wrist: Secondary | ICD-10-CM | POA: Diagnosis not present

## 2018-11-30 DIAGNOSIS — R6889 Other general symptoms and signs: Secondary | ICD-10-CM | POA: Diagnosis not present

## 2018-11-30 DIAGNOSIS — Z743 Need for continuous supervision: Secondary | ICD-10-CM | POA: Diagnosis not present

## 2018-11-30 DIAGNOSIS — G459 Transient cerebral ischemic attack, unspecified: Secondary | ICD-10-CM | POA: Diagnosis not present

## 2018-11-30 DIAGNOSIS — R109 Unspecified abdominal pain: Secondary | ICD-10-CM | POA: Diagnosis not present

## 2018-11-30 DIAGNOSIS — R2681 Unsteadiness on feet: Secondary | ICD-10-CM | POA: Diagnosis not present

## 2018-11-30 DIAGNOSIS — S6292XD Unspecified fracture of left wrist and hand, subsequent encounter for fracture with routine healing: Secondary | ICD-10-CM | POA: Diagnosis not present

## 2018-11-30 DIAGNOSIS — I2699 Other pulmonary embolism without acute cor pulmonale: Secondary | ICD-10-CM | POA: Diagnosis not present

## 2018-11-30 DIAGNOSIS — Z9181 History of falling: Secondary | ICD-10-CM | POA: Diagnosis not present

## 2018-11-30 LAB — SARS CORONAVIRUS 2 (TAT 6-24 HRS): SARS Coronavirus 2: NEGATIVE

## 2018-11-30 MED ORDER — SENNOSIDES-DOCUSATE SODIUM 8.6-50 MG PO TABS: 1.0000 | ORAL_TABLET | Freq: Every day | ORAL | Status: DC

## 2018-11-30 MED ORDER — APIXABAN 5 MG PO TABS: 10.0000 mg | ORAL_TABLET | Freq: Two times a day (BID) | ORAL | Status: DC

## 2018-11-30 MED ORDER — APIXABAN 5 MG PO TABS: 5.0000 mg | ORAL_TABLET | Freq: Two times a day (BID) | ORAL | Status: DC

## 2018-11-30 MED ORDER — ATORVASTATIN CALCIUM 20 MG PO TABS: 20.0000 mg | ORAL_TABLET | Freq: Every day | ORAL | Status: DC

## 2018-11-30 NOTE — TOC Transition Note (Signed)
Transition of Care [facility]) - CM/SW Discharge Note   Patient Details  Name: PENN GRISSETT MRN: [id number] Date of Birth: 02-Mar-1929  [facility] CM/SW Contact:  Pollie Friar, RN Phone Number: 11/30/2018, 10:55 AM   Clinical Narrative:    Pt discharging to [facility] today. CM has updated the patient's son: Khallid. PTAR to provide transportation. Bedside RN updated and d/c packet is at the desk.   Room: 308 Number for report: [phone]   Final next level of care: Skilled Nursing Facility Barriers to Discharge: No Barriers Identified   Patient Goals and CMS Choice   CMS Medicare.gov Compare Post Acute Care list provided to:: Patient Represenative (must comment) Choice offered to / list presented to : Adult Children  Discharge Placement              Patient chooses bed at: [place], [place] Patient to be transferred to facility by: ptar Name of family member notified: [place] Patient and family notified of of transfer: 11/30/18  Discharge Plan and Services In-house Referral: Clinical Social Work Discharge Planning Services: CM Consult                                 Social Determinants of Health (SDOH) Interventions     Readmission Risk Interventions No flowsheet data found.

## 2018-11-30 NOTE — Progress Notes (Signed)
Patient being discharged to [facility] today, transportation provided by [employer]. Report given to SNF RN Danford Bad. Belongings returned to patient (clothes, phone, Pensions consultant).

## 2018-11-30 NOTE — Discharge Summary (Signed)
Physician Discharge Summary  Jeff Lamb HGD:[id number] DOB: 17-Nov-1929 DOA: 11/25/2018  PCP: Hoyt Koch, MD  Admit date: 11/25/2018 Discharge date: 11/30/2018  Admitted From: home  Disposition:  SNF  Recommendations for Outpatient Follow-up:  1. Follow up with PCP in 1-2 weeks 2. Follow-up with neurology, office will call for appointment.  Home Health: Not applicable Equipment/Devices: Not applicable  Discharge Condition: Stable CODE STATUS: Full code Diet recommendation: Low-salt low-carb diet  Discharge summary: 85 year old gentleman with history of TIA with no residual neurological deficit, hypertension, hyperlipidemia was brought to emergency room, found down at home with suspected stroke for unknown time. He apparently fell in the morning and was unable to get up.  Was shouting for neighbors who called 911.  In the emergency room he was found with left-sided hemiplegia, CT scan suggestive of right PCA territory stroke.  He was also found to have acute PE on the right pulmonary artery.  Seen by neurology on arrival, outside TPA window.  Acute right PCA territory ischemic stroke: Clinical findings, left hemiplegia, upper extremity more than lower extremity.  Left hemianopia. CT head findings, right PCA territory infarction.  Repeat CT scan is stable. MRI of the brain, right PCA infarction CTA of the head and neck, right PCA occlusion, acute PE within right main pulmonary artery and left upper lobe artery. 2D echocardiogram with normal ejection fraction.  No source of embolus.  Bubble study positive for PFO with [name] to at rest, degree 4 with Valsalva. Antiplatelet therapy, on heparin, changed to Eliquis now. LDL 29, on Lipitor 20 mg at home continued. Hemoglobin A1c, 6.4 suggesting prediabetes, dietary modification advised.  No indication to start treatment.  Hypertension: Blood pressure stable.  Resumed hydrochlorothiazide.  Acute pulmonary embolism:  With no acute cor pulmonale.  On room air.  Unprovoked.  Hemodynamically stable.  Treated with heparin drip.  Changed to Eliquis as per PE treatment.   Eliquis 10 mg twice a day until 12/04/2018, then 5 mg twice a day lifelong.  PFO present with active thromboembolism in the pulmonary artery and suspected in right PCA.  Lifelong anticoagulation.  Lower extremity duplexes were normal.   Patient is medically stabilizing.  He is able to go to skilled nursing rehab level of care today to continue to work with inpatient therapies before returning home.  Discharge Diagnoses:  Principal Problem:   Acute cerebrovascular accident (CVA) ([place]) Active Problems:   Hypertension   Hyperlipidemia   Pulmonary embolism [facility])   Prediabetes    Discharge Instructions  Discharge Instructions    Ambulatory referral to Neurology   Complete by: As directed    Follow up with stroke clinic NP (Jessica Vanschaick or Cecille Rubin, if both not available, consider Zachery Dauer, or Ahern) at [facility] in about 4 weeks. Thanks.   Diet - low sodium heart healthy   Complete by: As directed    Diet Carb Modified   Complete by: As directed    Increase activity slowly   Complete by: As directed      Allergies as of 11/30/2018   No Known Allergies     Medication List    STOP taking these medications   aspirin EC 81 MG tablet   ondansetron 4 MG disintegrating tablet Commonly known as: Zofran ODT   sildenafil 25 MG tablet Commonly known as: VIAGRA   triamterene-hydrochlorothiazide 37.5-25 MG tablet Commonly known as: MAXZIDE-25     TAKE these medications   apixaban 5 MG Tabs tablet Commonly known  asEverlene Balls Take 2 tablets (10 mg total) by mouth 2 (two) times daily for 4 days.   apixaban 5 MG Tabs tablet Commonly known as: ELIQUIS Take 1 tablet (5 mg total) by mouth 2 (two) times daily. Start taking on: December 05, 2018   atorvastatin 20 MG tablet Commonly known as: LIPITOR Take 1 tablet (20  mg total) by mouth daily at 6 PM. What changed:   medication strength  how much to take  how to take this  when to take this  additional instructions   CLEAR EYES OP Place 1 drop into both eyes daily as needed (itching/dry eyes).   hydrochlorothiazide 12.5 MG capsule Commonly known as: MICROZIDE Take 12.5 mg by mouth every morning.   multivitamin with minerals Tabs tablet Take 1 tablet by mouth every morning.   senna-docusate 8.6-50 MG tablet Commonly known as: Senokot-S Take 1 tablet by mouth at bedtime.       Contact information for follow-up providers    [facility]. Schedule an appointment as soon as possible for a visit in 4 week(s).   Specialty: Neurology Contact information: [address] [place] [address] [phone]           Contact information for after-discharge care    Destination    [facility] .   Service: Skilled Nursing Contact information: [address] [place] [address] [phone]                 No Known Allergies  Consultations:  Neurology   Procedures/Studies: Ct Angio Head W Or Wo Contrast  Result Date: 11/25/2018 CLINICAL DATA:  Stroke follow-up EXAM: CT ANGIOGRAPHY HEAD AND NECK TECHNIQUE: Multidetector CT imaging of the head and neck was performed using the standard protocol during bolus administration of intravenous contrast. Multiplanar CT image reconstructions and MIPs were obtained to evaluate the vascular anatomy. Carotid stenosis measurements (when applicable) are obtained utilizing NASCET criteria, using the distal internal carotid diameter as the denominator. CONTRAST:  OMNIPAQUE IOHEXOL 350 MG/ML SOLN COMPARISON:  Head CT 11/25/2018 FINDINGS: CTA NECK FINDINGS SKELETON: There is no bony spinal canal stenosis. No lytic or blastic lesion. OTHER NECK: Normal pharynx, larynx and major salivary glands. No  cervical lymphadenopathy. Unremarkable thyroid gland. UPPER CHEST: No pneumothorax or pleural effusion. No nodules or masses. AORTIC ARCH AND PULMONARY ARTERIES: There is a filling defect within the right main pulmonary artery and the left upper lobar artery. There is mild calcific atherosclerosis of the aortic arch. There is no aneurysm, dissection or hemodynamically significant stenosis of the visualized portion of the aorta. Normal variant aortic arch branching pattern with the left vertebral artery arising independently from the aortic arch. The visualized proximal subclavian arteries are widely patent. RIGHT CAROTID SYSTEM: Normal without aneurysm, dissection or stenosis. LEFT CAROTID SYSTEM: Normal without aneurysm, dissection or stenosis. VERTEBRAL ARTERIES: Right dominant configuration. Both origins are clearly patent. There is no dissection, occlusion or flow-limiting stenosis to the skull base (V1-V3 segments). CTA HEAD FINDINGS POSTERIOR CIRCULATION: --Vertebral arteries: Normal V4 segments. --Posterior inferior cerebellar arteries (PICA): Patent origins from the vertebral arteries. --Anterior inferior cerebellar arteries (AICA): Patent origins from the basilar artery. --Basilar artery: Normal. --Superior cerebellar arteries: Normal. --Posterior cerebral arteries: The proximal P2 segment of the right PCA is occluded. The left PCA is normal. Posterior communicating arteries are diminutive or absent. ANTERIOR CIRCULATION: --Intracranial internal carotid arteries: Normal. --Anterior cerebral arteries (ACA): Normal. Both A1 segments are present.  Patent anterior communicating artery (a-comm). --Middle cerebral arteries (MCA): Normal. VENOUS SINUSES: As permitted by contrast timing, patent. ANATOMIC VARIANTS: None Review of the MIP images confirms the above findings. IMPRESSION: 1. Occlusion of the proximal P2 segment of the right PCA. No other intracranial arterial occlusion or high-grade stenosis. 2. Acute  pulmonary emboli within the right main pulmonary artery and left upper lobar artery. 3. Aortic Atherosclerosis (ICD10-I70.0). Critical Value/emergent results were called by telephone at the time of interpretation on 11/25/2018 at 9:52 pm to [facility] , who verbally acknowledged these results. Electronically Signed   By: Deatra Robinson M.D.   On: 11/25/2018 22:07   Ct Head Wo Contrast  Result Date: 11/29/2018 CLINICAL DATA:  85 year old male with recent stroke. Patient is on anticoagulation. Evaluate for hemorrhagic conversion. EXAM: CT HEAD WITHOUT CONTRAST TECHNIQUE: Contiguous axial images were obtained from the base of the skull through the vertex without intravenous contrast. COMPARISON:  Head CT dated 11/25/2018 an MRI dated 11/26/2018 FINDINGS: Brain: Right PCA territory infarct. Interval increase in the size of the right thalamic infarct. No acute intracranial hemorrhage/hemorrhagic conversion. Small old cerebellar lacunar infarcts. There is no mass effect or midline shift. No extra-axial fluid collection. Vascular: No hyperdense vessel or unexpected calcification. Skull: Normal. Negative for fracture or focal lesion. Sinuses/Orbits: No acute finding. Other: None IMPRESSION: Evolving right PCA territory infarct. No acute intracranial hemorrhage/hemorrhagic conversion. Electronically Signed   By: Elgie Collard M.D.   On: 11/29/2018 10:12   Ct Head Wo Contrast  Result Date: 11/25/2018 CLINICAL DATA:  Left arm weakness EXAM: CT HEAD WITHOUT CONTRAST TECHNIQUE: Contiguous axial images were obtained from the base of the skull through the vertex without intravenous contrast. COMPARISON:  None. FINDINGS: Brain: There is focal hypodensity of the posterolateral right thalamus and adjacent cerebral pedicle (series 3, image 17) in addition to the inferior right occipital lobe (series 3, image 12, series 5, image 50). There is extensive underlying periventricular and deep white matter  hypodensity. Nonacute left PCA territory occipital encephalomalacia (series 3, image 16). Vascular: No hyperdense vessel or unexpected calcification. Skull: Normal. Negative for fracture or focal lesion. Sinuses/Orbits: No acute finding. Other: None. IMPRESSION: 1. There is focal hypodensity of the posterolateral right thalamus and adjacent cerebral pedicle (series 3, image 17) in addition to the inferior right occipital lobe (series 3, image 12, series 5, image 50). Findings are suspicious for acute to subacute edematous PCA territory infarction. MRI may be used to more sensitively assess for acute diffusion restricting infarction if desired. 2. Underlying small-vessel white matter disease and nonacute left PCA territory encephalomalacia. Electronically Signed   By: Lauralyn Primes M.D.   On: 11/25/2018 18:12   Ct Angio Neck W Or Wo Contrast  Result Date: 11/25/2018 CLINICAL DATA:  Stroke follow-up EXAM: CT ANGIOGRAPHY HEAD AND NECK TECHNIQUE: Multidetector CT imaging of the head and neck was performed using the standard protocol during bolus administration of intravenous contrast. Multiplanar CT image reconstructions and MIPs were obtained to evaluate the vascular anatomy. Carotid stenosis measurements (when applicable) are obtained utilizing NASCET criteria, using the distal internal carotid diameter as the denominator. CONTRAST:  OMNIPAQUE IOHEXOL 350 MG/ML SOLN COMPARISON:  Head CT 11/25/2018 FINDINGS: CTA NECK FINDINGS SKELETON: There is no bony spinal canal stenosis. No lytic or blastic lesion. OTHER NECK: Normal pharynx, larynx and major salivary glands. No cervical lymphadenopathy. Unremarkable thyroid gland. UPPER CHEST: No pneumothorax or pleural effusion. No nodules or masses. AORTIC ARCH AND PULMONARY ARTERIES: There is a  filling defect within the right main pulmonary artery and the left upper lobar artery. There is mild calcific atherosclerosis of the aortic arch. There is no aneurysm,  dissection or hemodynamically significant stenosis of the visualized portion of the aorta. Normal variant aortic arch branching pattern with the left vertebral artery arising independently from the aortic arch. The visualized proximal subclavian arteries are widely patent. RIGHT CAROTID SYSTEM: Normal without aneurysm, dissection or stenosis. LEFT CAROTID SYSTEM: Normal without aneurysm, dissection or stenosis. VERTEBRAL ARTERIES: Right dominant configuration. Both origins are clearly patent. There is no dissection, occlusion or flow-limiting stenosis to the skull base (V1-V3 segments). CTA HEAD FINDINGS POSTERIOR CIRCULATION: --Vertebral arteries: Normal V4 segments. --Posterior inferior cerebellar arteries (PICA): Patent origins from the vertebral arteries. --Anterior inferior cerebellar arteries (AICA): Patent origins from the basilar artery. --Basilar artery: Normal. --Superior cerebellar arteries: Normal. --Posterior cerebral arteries: The proximal P2 segment of the right PCA is occluded. The left PCA is normal. Posterior communicating arteries are diminutive or absent. ANTERIOR CIRCULATION: --Intracranial internal carotid arteries: Normal. --Anterior cerebral arteries (ACA): Normal. Both A1 segments are present. Patent anterior communicating artery (a-comm). --Middle cerebral arteries (MCA): Normal. VENOUS SINUSES: As permitted by contrast timing, patent. ANATOMIC VARIANTS: None Review of the MIP images confirms the above findings. IMPRESSION: 1. Occlusion of the proximal P2 segment of the right PCA. No other intracranial arterial occlusion or high-grade stenosis. 2. Acute pulmonary emboli within the right main pulmonary artery and left upper lobar artery. 3. Aortic Atherosclerosis (ICD10-I70.0). Critical Value/emergent results were called by telephone at the time of interpretation on 11/25/2018 at 9:52 pm to providerMCNEILL [facility] , who verbally acknowledged these results. Electronically Signed   By:  Deatra Robinson M.D.   On: 11/25/2018 22:07   Mr Brain Wo Contrast  Result Date: 11/26/2018 CLINICAL DATA:  Stroke. EXAM: MRI HEAD WITHOUT CONTRAST TECHNIQUE: Multiplanar, multiecho pulse sequences of the brain and surrounding structures were obtained without intravenous contrast. COMPARISON:  CT head and CTA head and neck 11/25/2018 FINDINGS: Brain: Acute infarct right PCA territory. Restricted diffusion in the right thalamus as well as the right posterior hippocampus and inferior medial occipital lobe. Generalized atrophy. Chronic ischemic change in the white matter. Chronic infarcts in the cerebellum bilaterally. Small chronic infarct left thalamus. Small chronic hemorrhage right posterolateral temporal lobe. Chronic hemorrhage in the left cerebellum. No mass lesion. Ventricle size normal. No midline shift. Mild atrophy. Vascular: Normal arterial flow voids Skull and upper cervical spine: Negative Sinuses/Orbits: Paranasal sinuses clear.  Bilateral cataract surgery Other: None IMPRESSION: Acute infarct right PCA territory without hemorrhage. Atrophy and chronic ischemic change as above. Chronic hemorrhage right posterolateral temporal lobe and left cerebellum. Electronically Signed   By: Marlan Palau M.D.   On: 11/26/2018 12:51   Dg Chest Portable 1 View  Result Date: 11/25/2018 CLINICAL DATA:  Cough, hypoxia EXAM: PORTABLE CHEST 1 VIEW COMPARISON:  03/05/2018 FINDINGS: Cardiomegaly, vascular congestion. Right lung clear. Left lower lobe opacity, likely atelectasis. No overt edema or visible effusions. No acute bony abnormality. IMPRESSION: Cardiomegaly, vascular congestion. Left base opacity, favor atelectasis. Electronically Signed   By: Charlett Nose M.D.   On: 11/25/2018 19:38   Dg Ankle Left Port  Result Date: 11/26/2018 CLINICAL DATA:  Ankle pain EXAM: PORTABLE LEFT ANKLE -3 VIEW COMPARISON:  None. FINDINGS: Tarsal degenerative changes are noted. Calcaneal spurring is seen. Soft tissue  calcifications are noted beneath the calcaneus medially. No acute fracture or dislocation is noted. IMPRESSION: Soft tissue calcifications beneath the calcaneus medially  of uncertain chronicity. Degenerative changes of the tarsal bones. Electronically Signed   By: Alcide Clever M.D.   On: 11/26/2018 03:00   Vas [place] Transcranial Doppler W Bubbles  Result Date: 11/28/2018  Transcranial Doppler with Bubble Indications: Stroke and PE. Comparison Study: no prior Performing Technologist: Blanch Media RVS  Examination Guidelines: A complete evaluation includes B-mode imaging, spectral Doppler, color Doppler, and power Doppler as needed of all accessible portions of each vessel. Bilateral testing is considered an integral part of a complete examination. Limited examinations for reoccurring indications may be performed as noted.  Summary:  A vascular evaluation was performed. The right middle cerebral artery was studied. An IV was inserted into the patient's left forearm . Verbal informed consent was obtained.  HITS heard at rest: small. Spencer grade 2 HITS heard during valsalva: monderate to large. Spencer grade 4 Positive TCD Bubble study indicative of moderate to large size right to left shunt *See table(s) above for measurements and observations.  Diagnosing physician: Delia Heady MD Electronically signed by Delia Heady MD on 11/28/2018 at 8:17:31 AM.    Final    Vas [place] Lower Extremity Venous (dvt)  Result Date: 11/26/2018  Lower Venous Study Indications: Pulmonary embolism.  Comparison Study: no prior Performing Technologist: Blanch Media RVS  Examination Guidelines: A complete evaluation includes B-mode imaging, spectral Doppler, color Doppler, and power Doppler as needed of all accessible portions of each vessel. Bilateral testing is considered an integral part of a complete examination. Limited examinations for reoccurring indications may be performed as noted.   +---------+---------------+---------+-----------+----------+--------------+ RIGHT    CompressibilityPhasicitySpontaneityPropertiesThrombus Aging +---------+---------------+---------+-----------+----------+--------------+ CFV      Full           Yes      Yes                                 +---------+---------------+---------+-----------+----------+--------------+ SFJ      Full                                                        +---------+---------------+---------+-----------+----------+--------------+ FV Prox  Full                                                        +---------+---------------+---------+-----------+----------+--------------+ FV Mid   Full                                                        +---------+---------------+---------+-----------+----------+--------------+ FV DistalFull                                                        +---------+---------------+---------+-----------+----------+--------------+ PFV      Full                                                        +---------+---------------+---------+-----------+----------+--------------+  POP      Full           Yes      Yes                                 +---------+---------------+---------+-----------+----------+--------------+ PTV      Full                                                        +---------+---------------+---------+-----------+----------+--------------+ PERO     Full                                                        +---------+---------------+---------+-----------+----------+--------------+   +---------+---------------+---------+-----------+----------+--------------+ LEFT     CompressibilityPhasicitySpontaneityPropertiesThrombus Aging +---------+---------------+---------+-----------+----------+--------------+ CFV      Full           Yes      Yes                                  +---------+---------------+---------+-----------+----------+--------------+ SFJ      Full                                                        +---------+---------------+---------+-----------+----------+--------------+ FV Prox  Full                                                        +---------+---------------+---------+-----------+----------+--------------+ FV Mid   Full                                                        +---------+---------------+---------+-----------+----------+--------------+ FV DistalFull                                                        +---------+---------------+---------+-----------+----------+--------------+ PFV      Full                                                        +---------+---------------+---------+-----------+----------+--------------+ POP      Full           Yes      Yes                                 +---------+---------------+---------+-----------+----------+--------------+  PTV      Full                                                        +---------+---------------+---------+-----------+----------+--------------+ PERO     Full                                                        +---------+---------------+---------+-----------+----------+--------------+  No evidence of deep venous thrombosis in the lower extremities.   *See table(s) above for measurements and observations. Electronically signed by Sherald Hesshristopher Clark MD on 11/26/2018 at 2:37:27 PM.    Final     Subjective: Patient seen and examined.  No overnight events.  He is very motivated to work with therapies and improving his left arm weakness.   Discharge Exam: Vitals:   11/30/18 0426 11/30/18 0811  BP: 117/70 135/73  Pulse: (!) 59 (!) 59  Resp: 16 18  Temp: 98 F (36.7 C) 98 F (36.7 C)  SpO2: 94% 94%   Vitals:   11/29/18 1945 11/29/18 2350 11/30/18 0426 11/30/18 0811  BP: (!) 143/86 118/76 117/70 135/73  Pulse: (!) 57 63  (!) 59 (!) 59  Resp: 16 17 16 18   Temp: 98.6 F (37 C)  98 F (36.7 C) 98 F (36.7 C)  TempSrc: Oral  Oral Oral  SpO2: 96% 96% 94% 94%  Weight:      Height:        General: Pt is alert, awake, not in acute distress, on room air.  Eating breakfast in the morning. Cardiovascular: RRR, S1/S2 +, no rubs, no gallops, no added sounds Respiratory: CTA bilaterally, no wheezing, no rhonchi Abdominal: Soft, NT, ND, bowel sounds + Extremities: no edema, no cyanosis Neuro exam: Left hemianopia Left upper extremity 4/5 distal, 3+/5 proximal, poor coordination. Left lower extremity 4/5 distal, 3/5 proximal    The results of significant diagnostics from this hospitalization (including imaging, microbiology, ancillary and laboratory) are listed below for reference.     Microbiology: Recent Results (from the past 240 hour(s))  SARS CORONAVIRUS 2 (TAT 6-24 HRS) Nasopharyngeal Nasopharyngeal Swab     Status: None   Collection Time: 11/25/18  5:18 PM   Specimen: Nasopharyngeal Swab  Result Value Ref Range Status   SARS Coronavirus 2 NEGATIVE NEGATIVE Final    Comment: (NOTE) SARS-CoV-2 target nucleic acids are NOT DETECTED. The SARS-CoV-2 RNA is generally detectable in upper and lower respiratory specimens during the acute phase of infection. Negative results do not preclude SARS-CoV-2 infection, do not rule out co-infections with other pathogens, and should not be used as the sole basis for treatment or other patient management decisions. Negative results must be combined with clinical observations, patient history, and epidemiological information. The expected result is Negative. Fact Sheet for Patients: HairSlick.nohttps://www.fda.gov/media/138098/download Fact Sheet for Healthcare Providers: quierodirigir.comhttps://www.fda.gov/media/138095/download This test is not yet approved or cleared by the [place] FDA and  has been authorized for detection and/or diagnosis of SARS-CoV-2 by FDA under an Emergency Use  Authorization (EUA). This EUA will remain  in effect (meaning this test can be used) for the duration of the COVID-19 declaration under Section 56 4(b)(1) of the Act,  21 U.S.C. section 360bbb-3(b)(1), unless the authorization is terminated or revoked sooner. Performed at [facility], [address], [place], [place] [address]   SARS CORONAVIRUS 2 (TAT 6-24 HRS) Nasopharyngeal Nasopharyngeal Swab     Status: None   Collection Time: 11/29/18  1:27 PM   Specimen: Nasopharyngeal Swab  Result Value Ref Range Status   SARS Coronavirus 2 NEGATIVE NEGATIVE Final    Comment: (NOTE) SARS-CoV-2 target nucleic acids are NOT DETECTED. The SARS-CoV-2 RNA is generally detectable in upper and lower respiratory specimens during the acute phase of infection. Negative results do not preclude SARS-CoV-2 infection, do not rule out co-infections with other pathogens, and should not be used as the sole basis for treatment or other patient management decisions. Negative results must be combined with clinical observations, patient history, and epidemiological information. The expected result is Negative. Fact Sheet for Patients: HairSlick.no Fact Sheet for Healthcare Providers: quierodirigir.com This test is not yet approved or cleared by the [place] FDA and  has been authorized for detection and/or diagnosis of SARS-CoV-2 by FDA under an Emergency Use Authorization (EUA). This EUA will remain  in effect (meaning this test can be used) for the duration of the COVID-19 declaration under Section 56 4(b)(1) of the Act, 21 U.S.C. section 360bbb-3(b)(1), unless the authorization is terminated or revoked sooner. Performed at [facility], [address] [place], [place] [address]      Labs: BNP (last 3 results) No results for input(s): BNP in the last 8760 hours. Basic Metabolic Panel: Recent Labs  Lab 11/25/18 1716  11/25/18 1750  NA 141 142  K 3.8 4.4  CL 102 104  CO2 24  --   GLUCOSE 109* 107*  BUN 13 18  CREATININE 1.24 1.10  CALCIUM 9.3  --    Liver Function Tests: Recent Labs  Lab 11/25/18 1716  AST 48*  ALT 26  ALKPHOS 69  BILITOT 1.6*  PROT 7.9  ALBUMIN 4.0   No results for input(s): LIPASE, AMYLASE in the last 168 hours. No results for input(s): AMMONIA in the last 168 hours. CBC: Recent Labs  Lab 11/25/18 1716 11/25/18 1750 11/27/18 0331 11/28/18 0344  WBC 11.2*  --  9.5 9.9  NEUTROABS 9.3*  --   --   --   HGB 17.0 17.3* 14.0 14.3  HCT 53.8* 51.0 42.6 43.5  MCV 99.4  --  96.4 97.1  PLT 184  --  162 185   Cardiac Enzymes: Recent Labs  Lab 11/25/18 1716  CKTOTAL 1,650*   BNP: Invalid input(s): POCBNP CBG: No results for input(s): GLUCAP in the last 168 hours. D-Dimer No results for input(s): DDIMER in the last 72 hours. Hgb A1c No results for input(s): HGBA1C in the last 72 hours. Lipid Profile No results for input(s): CHOL, HDL, LDLCALC, TRIG, CHOLHDL, LDLDIRECT in the last 72 hours. Thyroid function studies No results for input(s): TSH, T4TOTAL, T3FREE, THYROIDAB in the last 72 hours.  Invalid input(s): FREET3 Anemia work up No results for input(s): VITAMINB12, FOLATE, FERRITIN, TIBC, IRON, RETICCTPCT in the last 72 hours. Urinalysis    Component Value Date/Time   COLORURINE YELLOW 11/25/2018 1812   APPEARANCEUR CLEAR 11/25/2018 1812   LABSPEC 1.015 11/25/2018 1812   PHURINE 6.0 11/25/2018 1812   GLUCOSEU NEGATIVE 11/25/2018 1812   HGBUR LARGE (A) 11/25/2018 1812   BILIRUBINUR NEGATIVE 11/25/2018 1812   KETONESUR NEGATIVE 11/25/2018 1812   PROTEINUR 100 (A) 11/25/2018 1812   NITRITE NEGATIVE 11/25/2018  1812   LEUKOCYTESUR NEGATIVE 11/25/2018 1812   Sepsis Labs Invalid input(s): PROCALCITONIN,  WBC,  LACTICIDVEN Microbiology Recent Results (from the past 240 hour(s))  SARS CORONAVIRUS 2 (TAT 6-24 HRS) Nasopharyngeal Nasopharyngeal Swab      Status: None   Collection Time: 11/25/18  5:18 PM   Specimen: Nasopharyngeal Swab  Result Value Ref Range Status   SARS Coronavirus 2 NEGATIVE NEGATIVE Final    Comment: (NOTE) SARS-CoV-2 target nucleic acids are NOT DETECTED. The SARS-CoV-2 RNA is generally detectable in upper and lower respiratory specimens during the acute phase of infection. Negative results do not preclude SARS-CoV-2 infection, do not rule out co-infections with other pathogens, and should not be used as the sole basis for treatment or other patient management decisions. Negative results must be combined with clinical observations, patient history, and epidemiological information. The expected result is Negative. Fact Sheet for Patients: HairSlick.no Fact Sheet for Healthcare Providers: quierodirigir.com This test is not yet approved or cleared by the [place] FDA and  has been authorized for detection and/or diagnosis of SARS-CoV-2 by FDA under an Emergency Use Authorization (EUA). This EUA will remain  in effect (meaning this test can be used) for the duration of the COVID-19 declaration under Section 56 4(b)(1) of the Act, 21 U.S.C. section 360bbb-3(b)(1), unless the authorization is terminated or revoked sooner. Performed at [facility], [address], [place], [place] [address]   SARS CORONAVIRUS 2 (TAT 6-24 HRS) Nasopharyngeal Nasopharyngeal Swab     Status: None   Collection Time: 11/29/18  1:27 PM   Specimen: Nasopharyngeal Swab  Result Value Ref Range Status   SARS Coronavirus 2 NEGATIVE NEGATIVE Final    Comment: (NOTE) SARS-CoV-2 target nucleic acids are NOT DETECTED. The SARS-CoV-2 RNA is generally detectable in upper and lower respiratory specimens during the acute phase of infection. Negative results do not preclude SARS-CoV-2 infection, do not rule out co-infections with other pathogens, and should not be used as the sole  basis for treatment or other patient management decisions. Negative results must be combined with clinical observations, patient history, and epidemiological information. The expected result is Negative. Fact Sheet for Patients: HairSlick.no Fact Sheet for Healthcare Providers: quierodirigir.com This test is not yet approved or cleared by the [place] FDA and  has been authorized for detection and/or diagnosis of SARS-CoV-2 by FDA under an Emergency Use Authorization (EUA). This EUA will remain  in effect (meaning this test can be used) for the duration of the COVID-19 declaration under Section 56 4(b)(1) of the Act, 21 U.S.C. section 360bbb-3(b)(1), unless the authorization is terminated or revoked sooner. Performed at [facility], [address] [place], [place] [address]      Time coordinating discharge:  40 minutes  SIGNED:   Dorcas Carrow, MD  [facility] 11/30/2018, 9:39 AM

## 2018-12-01 ENCOUNTER — Emergency Department (HOSPITAL_COMMUNITY): Payer: Medicare Other

## 2018-12-01 ENCOUNTER — Emergency Department (HOSPITAL_COMMUNITY)
Admission: EM | Admit: 2018-12-01 | Discharge: 2018-12-01 | Disposition: A | Discharge status: Home / Self Care | Payer: Medicare Other | Attending: Emergency Medicine | Admitting: Emergency Medicine

## 2018-12-01 ENCOUNTER — Encounter (HOSPITAL_COMMUNITY): Payer: Self-pay | Admitting: Emergency Medicine

## 2018-12-01 ENCOUNTER — Telehealth: Payer: Self-pay | Admitting: *Deleted

## 2018-12-01 ENCOUNTER — Other Ambulatory Visit: Payer: Self-pay

## 2018-12-01 DIAGNOSIS — T07XXXA Unspecified multiple injuries, initial encounter: Secondary | ICD-10-CM | POA: Diagnosis present

## 2018-12-01 DIAGNOSIS — R519 Headache, unspecified: Secondary | ICD-10-CM | POA: Insufficient documentation

## 2018-12-01 DIAGNOSIS — S0003XA Contusion of scalp, initial encounter: Secondary | ICD-10-CM | POA: Insufficient documentation

## 2018-12-01 DIAGNOSIS — E785 Hyperlipidemia, unspecified: Secondary | ICD-10-CM | POA: Diagnosis not present

## 2018-12-01 DIAGNOSIS — S52592A Other fractures of lower end of left radius, initial encounter for closed fracture: Secondary | ICD-10-CM | POA: Diagnosis not present

## 2018-12-01 DIAGNOSIS — R109 Unspecified abdominal pain: Secondary | ICD-10-CM | POA: Insufficient documentation

## 2018-12-01 DIAGNOSIS — S199XXA Unspecified injury of neck, initial encounter: Secondary | ICD-10-CM | POA: Diagnosis not present

## 2018-12-01 DIAGNOSIS — Y92129 Unspecified place in nursing home as the place of occurrence of the external cause: Secondary | ICD-10-CM | POA: Diagnosis not present

## 2018-12-01 DIAGNOSIS — E876 Hypokalemia: Secondary | ICD-10-CM | POA: Insufficient documentation

## 2018-12-01 DIAGNOSIS — W1830XA Fall on same level, unspecified, initial encounter: Secondary | ICD-10-CM | POA: Insufficient documentation

## 2018-12-01 DIAGNOSIS — S0990XA Unspecified injury of head, initial encounter: Secondary | ICD-10-CM | POA: Diagnosis not present

## 2018-12-01 DIAGNOSIS — S5012XA Contusion of left forearm, initial encounter: Secondary | ICD-10-CM | POA: Insufficient documentation

## 2018-12-01 DIAGNOSIS — Y9301 Activity, walking, marching and hiking: Secondary | ICD-10-CM | POA: Diagnosis not present

## 2018-12-01 DIAGNOSIS — I2699 Other pulmonary embolism without acute cor pulmonale: Secondary | ICD-10-CM | POA: Diagnosis not present

## 2018-12-01 DIAGNOSIS — Z79899 Other long term (current) drug therapy: Secondary | ICD-10-CM | POA: Diagnosis not present

## 2018-12-01 DIAGNOSIS — S52502A Unspecified fracture of the lower end of left radius, initial encounter for closed fracture: Secondary | ICD-10-CM | POA: Insufficient documentation

## 2018-12-01 DIAGNOSIS — Y999 Unspecified external cause status: Secondary | ICD-10-CM | POA: Diagnosis not present

## 2018-12-01 DIAGNOSIS — I639 Cerebral infarction, unspecified: Secondary | ICD-10-CM | POA: Diagnosis not present

## 2018-12-01 DIAGNOSIS — I10 Essential (primary) hypertension: Secondary | ICD-10-CM | POA: Diagnosis not present

## 2018-12-01 LAB — CBC WITH DIFFERENTIAL/PLATELET
Abs Immature Granulocytes: 0.02 10*3/uL (ref 0.00–0.07)
Basophils Absolute: 0.1 10*3/uL (ref 0.0–0.1)
Basophils Relative: 1 %
Eosinophils Absolute: 0.3 10*3/uL (ref 0.0–0.5)
Eosinophils Relative: 3 %
HCT: 50.5 % (ref 39.0–52.0)
Hemoglobin: 16.4 g/dL (ref 13.0–17.0)
Immature Granulocytes: 0 %
Lymphocytes Relative: 24 %
Lymphs Abs: 2 10*3/uL (ref 0.7–4.0)
MCH: 31.8 pg (ref 26.0–34.0)
MCHC: 32.5 g/dL (ref 30.0–36.0)
MCV: 98.1 fL (ref 80.0–100.0)
Monocytes Absolute: 0.6 10*3/uL (ref 0.1–1.0)
Monocytes Relative: 8 %
Neutro Abs: 5.5 10*3/uL (ref 1.7–7.7)
Neutrophils Relative %: 64 %
Platelets: 214 10*3/uL (ref 150–400)
RBC: 5.15 MIL/uL (ref 4.22–5.81)
RDW: 12.7 % (ref 11.5–15.5)
WBC: 8.5 10*3/uL (ref 4.0–10.5)
nRBC: 0 % (ref 0.0–0.2)

## 2018-12-01 LAB — COMPREHENSIVE METABOLIC PANEL
ALT: 20 U/L (ref 0–44)
AST: 21 U/L (ref 15–41)
Albumin: 2.2 g/dL — ABNORMAL LOW (ref 3.5–5.0)
Alkaline Phosphatase: 41 U/L (ref 38–126)
Anion gap: 8 (ref 5–15)
BUN: 12 mg/dL (ref 8–23)
CO2: 20 mmol/L — ABNORMAL LOW (ref 22–32)
Calcium: 6.6 mg/dL — ABNORMAL LOW (ref 8.9–10.3)
Chloride: 114 mmol/L — ABNORMAL HIGH (ref 98–111)
Creatinine, Ser: 0.98 mg/dL (ref 0.61–1.24)
GFR calc Af Amer: >60 mL/min (ref >60)
GFR calc non Af Amer: >60 mL/min (ref >60)
Glucose, Bld: 78 mg/dL (ref 70–99)
Potassium: 3 mmol/L — ABNORMAL LOW (ref 3.5–5.1)
Sodium: 142 mmol/L (ref 135–145)
Total Bilirubin: 1.5 mg/dL — ABNORMAL HIGH (ref 0.3–1.2)
Total Protein: 4.6 g/dL — ABNORMAL LOW (ref 6.5–8.1)

## 2018-12-01 LAB — CBG MONITORING, ED: Glucose-Capillary: 95 mg/dL (ref 70–99)

## 2018-12-01 LAB — TROPONIN I (HIGH SENSITIVITY)
Troponin I (High Sensitivity): 10 ng/L (ref <18)
Troponin I (High Sensitivity): 11 ng/L (ref <18)

## 2018-12-01 MED ORDER — CYCLOBENZAPRINE HCL 10 MG PO TABS
10.0000 mg | ORAL_TABLET | Freq: Once | ORAL | Status: AC
  Administered 2018-12-01: 10 mg via ORAL
  Filled 2018-12-01: qty 1

## 2018-12-01 MED ORDER — POTASSIUM CHLORIDE ER 20 MEQ PO TBCR: 10.0000 meq | EXTENDED_RELEASE_TABLET | Freq: Every day | ORAL | 1 refills | Status: DC

## 2018-12-01 MED ORDER — POTASSIUM CHLORIDE CRYS ER 20 MEQ PO TBCR
40.0000 meq | EXTENDED_RELEASE_TABLET | Freq: Once | ORAL | Status: AC
  Administered 2018-12-01: 40 meq via ORAL
  Filled 2018-12-01: qty 2

## 2018-12-01 NOTE — ED Notes (Signed)
Pt has 2+ left radial pulse, cap refill less than 3 sec, pt able to wiggle fingers, pt has diminished sensation of arm due to prior stroke.

## 2018-12-01 NOTE — Progress Notes (Signed)
Orthopedic Tech Progress Note Patient Details:  Jeff Lamb 11-26-1929 [id number]   Ortho Devices Type of Ortho Device: Ace wrap, Cotton web roll, Short arm splint Ortho Device/Splint Location: left arm Ortho Device/Splint Interventions: Ordered, Application   Post Interventions Patient Tolerated: Well Instructions Provided: Care of device, Poper ambulation with device   [name] N Chrishawn Kring 12/01/2018, 2:15 PM

## 2018-12-01 NOTE — ED Notes (Signed)
Patient transported to CT 

## 2018-12-01 NOTE — ED Notes (Signed)
Pt son Deborah Dondero [phone]

## 2018-12-01 NOTE — ED Notes (Signed)
[facility] called for transport.  

## 2018-12-01 NOTE — Discharge Instructions (Addendum)
Return if any ptoblems .  See the Orthopaedist for evaluation.

## 2018-12-01 NOTE — Progress Notes (Signed)
Orthopedic Tech Progress Note Patient Details:  ARSALAN BRISBIN 03-08-1929 [id number]  Ortho Devices Type of Ortho Device: Ace wrap, Cotton web roll, Short arm splint Ortho Device/Splint Location: left arm Ortho Device/Splint Interventions: Ordered, Application   Post Interventions Patient Tolerated: Well Instructions Provided: Care of device, Poper ambulation with device   Lametria Klunk N Asani Mcburney 12/01/2018, 2:26 PM

## 2018-12-01 NOTE — ED Notes (Signed)
Pt is NSR on monitor 

## 2018-12-01 NOTE — ED Provider Notes (Signed)
[facility] EMERGENCY DEPARTMENT Provider Note   CSN: [id number] Arrival date & time: 12/01/18  0715     History   Chief Complaint Chief Complaint  Patient presents with   Arm Pain    HPI Jeff Lamb is a 85 y.o. male.     The history is provided by the patient. No language interpreter was used.  Arm Pain This is a new problem. The current episode started 1 to 2 hours ago. The problem has not changed since onset.Associated symptoms include chest pain, abdominal pain and headaches. Nothing aggravates the symptoms. Nothing relieves the symptoms. He has tried nothing for the symptoms. The treatment provided no relief.   Pt reports he fell ans hit his left arm and may have hit his head.  Pt reports he recently had a stroke and has left leg weakness. Pt states he does not want to use a walker.  Past Medical History:  Diagnosis Date   Biceps tendonitis 12/24/2010   Hearing loss of both ears    bilateral due to [place] damage [place]   Hypertension     Patient Active Problem List   Diagnosis Date Noted   Pulmonary embolism (HCC) 11/26/2018   Prediabetes 11/26/2018   Acute cerebrovascular accident (CVA) (HCC) 11/25/2018   Right shoulder pain 01/17/2018   Ventral hernia without obstruction or gangrene 03/14/2017   Cerumen impaction 11/20/2013   CVA (cerebral infarction) 07/12/2013   Hyperlipidemia 07/12/2013   Erectile dysfunction 08/02/2011   Routine health maintenance 12/24/2010   Hypertension     Past Surgical History:  Procedure Laterality Date   APPENDECTOMY     31   CATARACT EXTRACTION W/ INTRAOCULAR LENS IMPLANT  October '12   left.        Home Medications    Prior to Admission medications   Medication Sig Start Date End Date Taking? Authorizing Provider  apixaban (ELIQUIS) 5 MG TABS tablet Take 2 tablets (10 mg total) by mouth 2 (two) times daily for 4 days. 11/30/18 12/04/18  Dorcas Carrow, MD  apixaban (ELIQUIS) 5  MG TABS tablet Take 1 tablet (5 mg total) by mouth 2 (two) times daily. 12/05/18   Dorcas Carrow, MD  atorvastatin (LIPITOR) 20 MG tablet Take 1 tablet (20 mg total) by mouth daily at 6 PM. 11/30/18   Dorcas Carrow, MD  hydrochlorothiazide (MICROZIDE) 12.5 MG capsule Take 12.5 mg by mouth every morning. 11/12/18   [provider]  Multiple Vitamin (MULTIVITAMIN WITH MINERALS) TABS tablet Take 1 tablet by mouth every morning.    [provider]  Naphazoline HCl (CLEAR EYES OP) Place 1 drop into both eyes daily as needed (itching/dry eyes).    [provider]  senna-docusate (SENOKOT-S) 8.6-50 MG tablet Take 1 tablet by mouth at bedtime. 11/30/18   Dorcas Carrow, MD    Family History Family History  Problem Relation Age of Onset   Stroke Mother    Hypertension Mother    Cancer Maternal Uncle        throat cancer   Cancer Maternal Uncle        throat cancer   Diabetes Neg Hx    Heart disease Neg Hx     Social History Social History   Tobacco Use   Smoking status: Former Smoker    Packs/day: 5.00    Years: 20.00    Pack years: 100.00    Types: Cigars    Quit date: 12/22/1980    Years since quitting: 37.9  Smokeless tobacco: Never Used  Substance Use Topics   Alcohol use: Yes    Comment: ,moderate use   Drug use: No     Allergies   Patient has no known allergies.   Review of Systems Review of Systems  Cardiovascular: Positive for chest pain.  Gastrointestinal: Positive for abdominal pain.  Neurological: Positive for headaches.  All other systems reviewed and are negative.    Physical Exam Updated Vital Signs BP 131/87    Pulse 69    Temp 98.4 F (36.9 C) (Oral)    Resp 20    Ht 5\' 10"  (1.778 m)    Wt 86.7 kg    SpO2 98%    BMI 27.43 kg/m   Physical Exam Vitals signs and nursing note reviewed.  Constitutional:      Appearance: Normal appearance. He is well-developed.  HENT:     Head: Normocephalic and atraumatic.      Right Ear: External ear normal.     Left Ear: External ear normal.     Nose: Nose normal.     Mouth/Throat:     Mouth: Mucous membranes are moist.  Eyes:     Conjunctiva/sclera: Conjunctivae normal.  Neck:     Musculoskeletal: Normal range of motion and neck supple.  Cardiovascular:     Rate and Rhythm: Normal rate and regular rhythm.     Heart sounds: No murmur.  Pulmonary:     Effort: Pulmonary effort is normal. No respiratory distress.     Breath sounds: Normal breath sounds.  Abdominal:     General: Abdomen is flat.     Palpations: Abdomen is soft.     Tenderness: There is no abdominal tenderness.  Musculoskeletal:        General: Tenderness present.  Skin:    General: Skin is warm and dry.  Neurological:     General: No focal deficit present.     Mental Status: He is alert.  Psychiatric:        Mood and Affect: Mood normal.      ED Treatments / Results  Labs (all labs ordered are listed, but only abnormal results are displayed) Labs Reviewed  COMPREHENSIVE METABOLIC PANEL - Abnormal; Notable for the following components:      Result Value   Potassium 3.0 (*)    Chloride 114 (*)    CO2 20 (*)    Calcium 6.6 (*)    Total Protein 4.6 (*)    Albumin 2.2 (*)    Total Bilirubin 1.5 (*)    All other components within normal limits  CBC WITH DIFFERENTIAL/PLATELET  CBG MONITORING, ED  TROPONIN I (HIGH SENSITIVITY)  TROPONIN I (HIGH SENSITIVITY)    EKG EKG Interpretation  Date/Time:  Friday December 01 2018 07:19:38 EST Ventricular Rate:  60 PR Interval:    QRS Duration: 91 QT Interval:  515 QTC Calculation: 515 R Axis:   -81 Text Interpretation: Sinus rhythm Left anterior fascicular block Abnormal R-wave progression, late transition Nonspecific T abnormalities, anterior leads Prolonged QT interval Nonspecific ECG changes in comparison to prior Confirmed by 01-01-2005 (Alvira Monday) on 12/01/2018 8:01:32 AM   Radiology Dg Forearm Left  Result Date:  12/01/2018 CLINICAL DATA:  Pain following fall EXAM: LEFT FOREARM - 2 VIEW COMPARISON:  None. FINDINGS: Frontal and lateral views were obtained. There are transversely oriented nondisplaced fractures of the distal radius and ulna in the metaphyseal regions. Alignment anatomic in these areas. No other fractures are evident. No dislocation. A tiny  calcification volar to the lunate is likely of arthropathic etiology. IMPRESSION: Nondisplaced fractures of the distal radial and ulnar metaphyses. No other fractures evident. No dislocation. No appreciable arthropathy. Electronically Signed   By: Bretta BangWilliam  Woodruff III M.D.   On: 12/01/2018 08:14   Ct Head Wo Contrast  Result Date: 12/01/2018 CLINICAL DATA:  Head trauma, ataxia, fell last night trying to get to the bathroom, arm pain, history hypertension, former smoker, recent stroke EXAM: CT HEAD WITHOUT CONTRAST CT CERVICAL SPINE WITHOUT CONTRAST TECHNIQUE: Multidetector CT imaging of the head and cervical spine was performed following the standard protocol without intravenous contrast. Multiplanar CT image reconstructions of the cervical spine were also generated. COMPARISON:  CT head 11/25/2018, MR brain 11/26/2018 FINDINGS: CT HEAD FINDINGS Brain: Generalized atrophy. Normal ventricular morphology. No midline shift or mass effect. Small vessel chronic ischemic changes of deep cerebral white matter. Old BILATERAL sellar bell ir infarcts. Subacute RIGHT thalamic infarct, appears enlarged since the previous exam. Progressive low-attenuation within occipital lobe white matter RIGHT occipital lobe corresponding to PCA territory RIGHT occipital infarct seen on prior MR. No new infarction or mass lesion. No extra-axial fluid collections or intracranial hemorrhage. Vascular: No definite hyperdense vessels Skull: Intact Sinuses/Orbits: Clear Other: N/A CT CERVICAL SPINE FINDINGS Alignment: Normal Skull base and vertebrae: Vertebral body heights maintained without fracture  or bone destruction. Minimal scattered facet degenerative changes. Diffuse disc space narrowing and endplate spur formation throughout cervical spine. Bulky anterior spurs at C5 and C6. Soft tissues and spinal canal: Prevertebral soft tissues normal thickness Disc levels: Multilevel AP narrowing of the spinal canal, mild, multifactorial. Upper chest: Lung apices clear Other: N/A IMPRESSION: Atrophy with small vessel chronic ischemic changes of deep cerebral white matter. Subacute RIGHT occipital lobe and RIGHT thalamic infarcts. 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 No new intracranial abnormalities. Degenerative disc disease changes of the cervical spine as above. No acute cervical spine abnormalities. Electronically Signed   By: Ulyses SouthwardMark  Boles M.D.   On: 12/01/2018 09:41   Ct Cervical Spine Wo Contrast  Result Date: 12/01/2018 CLINICAL DATA:  Head trauma, ataxia, fell last night trying to get to the bathroom, arm pain, history hypertension, former smoker, recent stroke EXAM: CT HEAD WITHOUT CONTRAST CT CERVICAL SPINE WITHOUT CONTRAST TECHNIQUE: Multidetector CT imaging of the head and cervical spine was performed following the standard protocol without intravenous contrast. Multiplanar CT image reconstructions of the cervical spine were also generated. COMPARISON:  CT head 11/25/2018, MR brain 11/26/2018 FINDINGS: CT HEAD FINDINGS Brain: Generalized atrophy. Normal ventricular morphology. No midline shift or mass effect. Small vessel chronic ischemic changes of deep cerebral white matter. Old BILATERAL sellar bell ir infarcts. Subacute RIGHT thalamic infarct, appears enlarged since the previous exam. Progressive low-attenuation within occipital lobe white matter RIGHT occipital lobe corresponding to PCA territory RIGHT occipital infarct seen on prior MR. No new infarction or mass lesion. No extra-axial fluid collections or intracranial hemorrhage. Vascular: No definite hyperdense vessels Skull: Intact  Sinuses/Orbits: Clear Other: N/A CT CERVICAL SPINE FINDINGS Alignment: Normal Skull base and vertebrae: Vertebral body heights maintained without fracture or bone destruction. Minimal scattered facet degenerative changes. Diffuse disc space narrowing and endplate spur formation throughout cervical spine. Bulky anterior spurs at C5 and C6. Soft tissues and spinal canal: Prevertebral soft tissues normal thickness Disc levels: Multilevel AP narrowing of the spinal canal, mild, multifactorial. Upper chest: Lung apices clear Other: N/A IMPRESSION: Atrophy with small vessel chronic ischemic changes of deep cerebral white matter. Subacute RIGHT occipital lobe and  RIGHT thalamic infarcts. 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 No new intracranial abnormalities. Degenerative disc disease changes of the cervical spine as above. No acute cervical spine abnormalities. Electronically Signed   By: Lavonia Dana M.D.   On: 12/01/2018 09:41    Procedures Procedures (including critical care time)  Medications Ordered in ED Medications  potassium chloride SA (KLOR-CON) CR tablet 40 mEq (has no administration in time range)  cyclobenzaprine (FLEXERIL) tablet 10 mg (10 mg Oral Given 12/01/18 0854)     Initial Impression / Assessment and Plan / ED Course  I have reviewed the triage vital signs and the nursing notes.  Pertinent labs & imaging results that were available during my care of the patient were reviewed by me and considered in my medical decision making (see chart for details).        MDM  Ct head no acute abnormality, forearm no fracture, potassium low at 3.0.  Pt given kdur 40 meq.  Pt advised he should use a walker due to leg weakness   Final Clinical Impressions(s) / ED Diagnoses   Final diagnoses:  Contusion of left forearm, initial encounter  Contusion of scalp, initial encounter  Hypokalemia    ED Discharge Orders    None    An After Visit Summary was printed and given to the  patient.    Fransico Meadow, PA-C 12/01/18 1229    Gareth Morgan, MD 12/02/18 [phone]

## 2018-12-01 NOTE — ED Triage Notes (Signed)
Pt arrives via EMS from [facility] with reports of arm pain. Pt reports falling last night when trying to get to the bathroom and began complaining of arm pain. Pt asked for an advil, staff said they would go get the advil but then paramedics walked into room. EMS reports no neuro deficits.

## 2018-12-01 NOTE — ED Notes (Signed)
Patient transported to X-ray 

## 2018-12-01 NOTE — Telephone Encounter (Signed)
Pt was on TCM report admitted 11/22/18 was found with left-sided hemiplegia, CT scan suggestive of right PCA territory stroke. He was also found to have acute PE on the right pulmonary artery. Pt D/C 11/30/18 to SNF for rehab.Marland KitchenJohny Lamb

## 2018-12-01 NOTE — ED Notes (Signed)
Report given to Joellen Jersey, LPN at [facility] on pt.

## 2018-12-06 DIAGNOSIS — M25532 Pain in left wrist: Secondary | ICD-10-CM | POA: Diagnosis not present

## 2018-12-21 ENCOUNTER — Other Ambulatory Visit: Payer: Self-pay

## 2018-12-21 ENCOUNTER — Ambulatory Visit (INDEPENDENT_AMBULATORY_CARE_PROVIDER_SITE_OTHER): Payer: Medicare Other | Admitting: Adult Health

## 2018-12-21 ENCOUNTER — Encounter: Payer: Self-pay | Admitting: Adult Health

## 2018-12-21 VITALS — BP 142/87 | HR 73 | Temp 96.2°F | Ht 70.0 in

## 2018-12-21 DIAGNOSIS — I63431 Cerebral infarction due to embolism of right posterior cerebral artery: Secondary | ICD-10-CM | POA: Diagnosis not present

## 2018-12-21 DIAGNOSIS — I10 Essential (primary) hypertension: Secondary | ICD-10-CM | POA: Diagnosis not present

## 2018-12-21 DIAGNOSIS — Q211 Atrial septal defect: Secondary | ICD-10-CM

## 2018-12-21 DIAGNOSIS — E785 Hyperlipidemia, unspecified: Secondary | ICD-10-CM | POA: Diagnosis not present

## 2018-12-21 DIAGNOSIS — Q2112 Patent foramen ovale: Secondary | ICD-10-CM

## 2018-12-21 DIAGNOSIS — R278 Other lack of coordination: Secondary | ICD-10-CM | POA: Diagnosis not present

## 2018-12-21 MED ORDER — GABAPENTIN 100 MG PO CAPS: 100.0000 mg | ORAL_CAPSULE | Freq: Every day | ORAL | 4 refills | Status: DC

## 2018-12-21 NOTE — Progress Notes (Signed)
[facility] [address] [place]. [place] [address] [phone]       HOSPITAL FOLLOW UP NOTE  [name] Date of Birth:  Feb 11, 1929 Medical Record Number:  [id number]   Reason for Referral:  hospital stroke follow up    CHIEF COMPLAINT:  Chief Complaint  Patient presents with  . Hospitalization Follow-up    Alone. Treatment room. Patient mentioned that when he stands up his left legs gives out. He also mentioned that he has some burning in his left hand. He also has some pain to left arm when there is presssure applied to it.     HPI: Jeff Lamb being seen today for in office hospital follow-up regarding right PCA infarct on 11/25/2018.  History obtained from patient and chart review. Reviewed all radiology images and labs personally.  [name] is a 85 y.o. male with history of Htn, Hld, and bilateral hearing loss  who presented on 11/25/2018 with left-sided weakness.  Stroke showed large right PCA infarcts as evidenced on due to occlusion of right proximal P2 likely related to PE with positive PFO.   He did not receive IV t-PA due to late presentation (>4.5 hours from time of onset).  MRI showed large right PCA infarcts including right occipital and right.  CTA head/neck showed occlusion of the proximal P2 segment of the right PCA, acute pulmonary emboli within the right main pulmonary artery and left upper lobar artery.  2D echo showed an EF of 60 to 65% without cardiac source of embolus identified LE venous Doppler negative.  TCD bubble study is positive PFO with [name] at rest but with at least IV with Valsalva.  LDL 29.  A1c 6.4.  Initially being treated with IV heparin and transitioned to DOAC due to evidence of acute PE and recommend lifelong usage.  Due to recommended lifelong use of AC, no indication for PFO closure at this time.  HTN stable.  Recommended decreasing dose of statin to 20 mg daily.  Other shortness factors include  advanced age, former tobacco use, EtOH use and family history of stroke but no prior history of strokes.  Residual deficits of left hemiparesis, left hemianopia and decreased left sensory.  Discharge to SNF on 11/30/2018 for ongoing therapy.  Jeff Lamb is a 85 year old male who is being seen today for hospital follow-up.  He continues to reside at [facility] in [place], [place].  Residual deficits of left hemiparesis with ataxia, left hemisensory impairment and left superior homonymous quadrantanopia.  He continues to work with PT/OT.  He is able to ambulate with Rollator walker during therapy sessions and currently in a wheelchair.  He states at times he will have difficulty controlling his left arm and leg stating "they have a mind of their own".  He does endorse left-sided burning/stinging sensation with touch or pressure.  Patient's long-term goal is to return home where he was previously living independently.  He has continued on Eliquis without bleeding or bruising.  Continues on atorvastatin without myalgias.  Blood pressure today 142/87.  No further concerns at this time.    ROS:   14 system review of systems performed and negative with exception of weakness, numbness/tingling, pain  PMH:  Past Medical History:  Diagnosis Date  . Biceps tendonitis 12/24/2010  . 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 Hypertension     PSH:  Past Surgical History:  Procedure  Laterality Date  . APPENDECTOMY     31  . CATARACT EXTRACTION W/ INTRAOCULAR LENS IMPLANT  October '12   left.    Social History:  Social History   Socioeconomic History  . Marital status: Single    Spouse name: Not on file  . Number of children: 1  . Years of education: 29  . Highest education level: Not on file  Occupational History  . 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 Smoking status: Former Smoker    Packs/day: 5.00    Years: 20.00    Pack years:  100.00    Types: Cigars    Quit date: 12/22/1980    Years since quitting: 38.0  . Smokeless tobacco: Never Used  Substance and Sexual Activity  . Alcohol use: Yes    Comment: ,moderate use  . Drug use: No  . Sexual activity: Not on file  Other Topics Concern  . 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 Married ''66 - 1 year /divorced. 1 son '62. 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 Lives alone. Has cousins in the area. Son is principle contact: Jeff Lamb (c) [phone].               Social Determinants of Health   Financial Resource Strain:   . Difficulty of Paying Living Expenses: Not on file  Food Insecurity:   . Worried About Programme researcher, broadcasting/film/video in the Last Year: Not on file  . Ran Out of Food in the Last Year: Not on file  Transportation Needs:   . Lack of Transportation (Medical): Not on file  . Lack of Transportation (Non-Medical): Not on file  Physical Activity:   . Days of Exercise per Week: Not on file  . Minutes of Exercise per Session: Not on file  Stress:   . Feeling of Stress : Not on file  Social Connections: Unknown  . Frequency of Communication with Friends and Family: Patient refused  . Frequency of Social Gatherings with Friends and Family: Patient refused  . Attends Religious Services: Patient refused  . Active Member of Clubs or Organizations: Patient refused  . Attends Banker Meetings: Patient refused  . Marital Status: Patient refused  Intimate Partner Violence: Unknown  . Fear of Current or Ex-Partner: Patient refused  . Emotionally Abused: Patient refused  . Physically Abused: Patient refused  . Sexually Abused: Patient refused    Family History:  Family History  Problem Relation Age of Onset  . Stroke Mother   . Hypertension Mother   . Cancer Maternal Uncle        throat cancer  . Cancer Maternal Uncle        throat cancer  .  Diabetes Neg Hx   . Heart disease Neg Hx     Medications:   Current Outpatient Medications on File Prior to Visit  Medication Sig Dispense Refill  . apixaban (ELIQUIS) 5 MG TABS tablet Take 1 tablet (5 mg total) by mouth 2 (two) times daily. 60 tablet   . atorvastatin (LIPITOR) 20 MG tablet Take 1 tablet (20 mg total) by mouth daily at 6 PM.    . hydrochlorothiazide (MICROZIDE) 12.5 MG capsule Take 12.5 mg by mouth every morning.    . Multiple Vitamin (MULTIVITAMIN WITH MINERALS) TABS tablet Take 1 tablet by mouth every morning.    [name]  Naphazoline HCl (CLEAR EYES OP) Place 1 drop into both eyes daily as needed (itching/dry eyes).    . potassium chloride 20 MEQ TBCR Take 10 mEq by mouth daily. 30 tablet 1  . senna-docusate (SENOKOT-S) 8.6-50 MG tablet Take 1 tablet by mouth at bedtime.     No current facility-administered medications on file prior to visit.    Allergies:  No Known Allergies   Physical Exam  Vitals:   12/21/18 1501  BP: (!) 142/87  Pulse: 73  Temp: (!) 96.2 F (35.7 C)  TempSrc: Oral  Height: 5\' 10"  (1.778 m)   Body mass index is 27.43 kg/m. No exam data present  Depression screen Jeff Lamb 12/21/2018  Decreased Interest 0  Down, Depressed, Hopeless 0  PHQ - 2 Score 0     General: well developed, well nourished,  pleasant elderly African-American male, seated, in no evident distress Head: head normocephalic and atraumatic.   Neck: supple with no carotid or supraclavicular bruits Cardiovascular: regular rate and rhythm, no murmurs Musculoskeletal: no deformity Skin:  no rash/petichiae Vascular:  Normal pulses all extremities   Neurologic Exam Mental Status: Awake and fully alert.   Normal speech and language.  Oriented to place and time. Recent and remote memory intact. Attention span, concentration and fund of knowledge appropriate. Mood and affect appropriate.  Cranial Nerves: Fundoscopic exam reveals sharp disc margins. Pupils equal, briskly reactive  to light. Extraocular movements full without nystagmus. Visual fields left inferior homonymous quadrantanopia. Hearing intact. Facial sensation intact. Face, tongue, palate moves normally and symmetrically.  Motor: Normal bulk and tone.  Mild left hemiparesis 4/5 with dysmetria.  Full strength right upper and lower extremity Sensory.:  Decreased light touch sensation left upper and lower extremity Coordination: Rapid alternating movements normal in all extremities except mildly diminished left hand. Finger-to-nose and heel-to-shin shows dysmetria left upper and lower extremity. Gait and Station: Deferred as patient currently in wheelchair Reflexes: 1+ and symmetric. Toes downgoing.     NIHSS  5 Modified Rankin  3    Diagnostic Data (Labs, Imaging, Testing)  Ct Angio Head W Or Wo Contrast Ct Angio Neck W Or Wo Contrast 11/25/2018 IMPRESSION:  1. Occlusion of the proximal P2 segment of the right PCA. No other intracranial arterial occlusion or high-grade stenosis.  2. Acute pulmonary emboli within the right main pulmonary artery and left upper lobar artery.  3. Aortic Atherosclerosis (ICD10-I70.0).   Ct Head Wo Contrast 11/25/2018 IMPRESSION:  1. There is focal hypodensity of the posterolateral right thalamus and adjacent cerebral pedicle (series 3, image 17) in addition to the inferior right occipital lobe (series 3, image 12, series 5, image 50). Findings are suspicious for acute to subacute edematous PCA territory infarction. MRI may be used to more sensitively assess for acute diffusion restricting infarction if desired.  2. Underlying small-vessel white matter disease and nonacute left PCA territory encephalomalacia.    Mr Brain Wo Contrast Result Date: 11/26/2018 IMPRESSION: Acute infarct right PCA territory without hemorrhage. Atrophy and chronic ischemic change as above. Chronic hemorrhage right posterolateral temporal lobe and left cerebellum.   Vas 11/28/2018 Lower Extremity  Venous (dvt) Result Date: 11/26/2018 No evidence of deep venous thrombosis in the lower extremities.      Transthoracic Echocardiogram  1. Left ventricular ejection fraction, by visual estimation, is 60 to 65%. The left ventricle has normal function. Left ventricular septal wall thickness was moderately increased. Moderately increased left ventricular posterior wall thickness. There is  no left ventricular hypertrophy.  2. Elevated left ventricular end-diastolic pressure. 3. Global right ventricle has normal systolic function.The right ventricular size is normal. No increase in right ventricular wall thickness. 4. Left atrial size was normal. 5. Right atrial size was normal. 6. The mitral valve is normal in structure. Trace mitral valve regurgitation. No evidence of mitral stenosis. 7. The tricuspid valve is normal in structure. Tricuspid valve regurgitation is mild. 8. The aortic valve is tricuspid. Aortic valve regurgitation is not visualized. Mild aortic valve sclerosis without stenosis. 9. The pulmonic valve was normal in structure. Pulmonic valve regurgitation is not visualized. 10. There is mild dilatation of the ascending aorta and of the aortic root measuring 38 mm and 41mm respectively. 11. Moderately elevated pulmonary artery systolic pressure. 12. The inferior vena cava is normal in size with greater than 50% respiratory variability, suggesting right atrial pressure of 3 mmHg. 13. Left ventricular diastolic parameters are consistent with Grade I diastolic dysfunction (impaired relaxation).   ECG - SR rate 76 BPM. (See cardiology reading for complete details)    ASSESSMENT: Jeff Lamb is a 85 y.o. year old male presented with left-sided weakness on 11/25/2018 with stroke work-up showing large right PCA infarcts due to occlusion right proximal P2 likely related to PE due to positive PFO.  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  Vascular risk  factors include HTN, HLD, PFO advanced age, former tobacco use, EtOH use and family history of stroke.  Residual deficits include left superior homonymous quadrantanopia, mild left hemiparesis with dysmetria and left hemisensory impairment    PLAN:  1. Right PCA infarct: Continue Eliquis (apixaban) daily  and Lipitor for secondary stroke prevention. Maintain strict control of hypertension with blood pressure goal below 130/90, diabetes with hemoglobin A1c goal below 6.5% and cholesterol with LDL cholesterol (bad cholesterol) goal below 70 mg/dL.  I also advised the patient to eat a healthy diet with plenty of whole grains, cereals, fruits and vegetables, exercise regularly with at least 30 minutes of continuous activity daily and maintain ideal body weight. 2. PFO: Likely cause of stroke in setting of PE.  Initiated on lifelong AC due to unprovoked PE therefore no PFO closure indicated at this time 3. HTN: Advised to continue current treatment regimen.  Today's BP stable.  Advised to continue to monitor at home along with continued follow-up with PCP for management 4. HLD: Advised to continue current treatment regimen along with continued follow-up with PCP for future prescribing and monitoring of lipid panel 5. Residual deficits: Ongoing participation with facility therapies.  Recommend initiating gabapentin 100 mg nightly for possible improvement of post stroke pain.  Recommend facility monitor for possible increase in 2 weeks if needed.    Follow up in 3 months or call earlier if needed   Greater than 50% of time during this 45 minute visit was spent on counseling, explanation of diagnosis of right PCA stroke, reviewing risk factor management of PFO, PE, HTN and HLD, planning of further management along with potential future management, and discussion with patient answering all questions to satisfaction    Ihor AustinJessica McCue, AGNP-BC  [facility] [address] [place], [place] [id number]  Phone [phone] Fax [phone] Note: This document was prepared with digital dictation and possible smart phrase technology. Any transcriptional errors that result from this process are unintentional.

## 2018-12-21 NOTE — Patient Instructions (Signed)
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  Request increasing dosage after 2 weeks if needed  Continue Eliquis (apixaban) daily  and Lipitor for secondary stroke prevention  Continue to follow up with PCP regarding cholesterol and blood pressure management   Continue to monitor blood pressure at home  Maintain strict control of hypertension with blood pressure goal below 130/90, diabetes with hemoglobin A1c goal below 6.5% and cholesterol with LDL cholesterol (bad cholesterol) goal below 70 mg/dL. I also advised the patient to eat a healthy diet with plenty of whole grains, cereals, fruits and vegetables, exercise regularly and maintain ideal body weight.  Followup in the future with me in 3 months or call earlier if needed       Thank you for coming to see [place] at [facility]. I hope we have been able to provide you high quality care today.  You may receive a patient satisfaction survey over the next few weeks. We would appreciate your feedback and comments so that we may continue to improve ourselves and the health of our patients.

## 2018-12-21 NOTE — Progress Notes (Signed)
I agree with the above plan 

## 2018-12-23 DIAGNOSIS — M25512 Pain in left shoulder: Secondary | ICD-10-CM | POA: Diagnosis not present

## 2018-12-23 DIAGNOSIS — M12812 Other specific arthropathies, not elsewhere classified, left shoulder: Secondary | ICD-10-CM | POA: Diagnosis not present

## 2019-01-12 DIAGNOSIS — I2699 Other pulmonary embolism without acute cor pulmonale: Secondary | ICD-10-CM | POA: Diagnosis not present

## 2019-01-12 DIAGNOSIS — I10 Essential (primary) hypertension: Secondary | ICD-10-CM | POA: Diagnosis not present

## 2019-01-12 DIAGNOSIS — I639 Cerebral infarction, unspecified: Secondary | ICD-10-CM | POA: Diagnosis not present

## 2019-01-12 DIAGNOSIS — I69352 Hemiplegia and hemiparesis following cerebral infarction affecting left dominant side: Secondary | ICD-10-CM | POA: Diagnosis not present

## 2019-01-12 DIAGNOSIS — K5900 Constipation, unspecified: Secondary | ICD-10-CM | POA: Diagnosis not present

## 2019-01-12 DIAGNOSIS — E785 Hyperlipidemia, unspecified: Secondary | ICD-10-CM | POA: Diagnosis not present

## 2019-01-12 DIAGNOSIS — H04123 Dry eye syndrome of bilateral lacrimal glands: Secondary | ICD-10-CM | POA: Diagnosis not present

## 2019-01-12 DIAGNOSIS — R7303 Prediabetes: Secondary | ICD-10-CM | POA: Diagnosis not present

## 2019-01-12 DIAGNOSIS — Z7901 Long term (current) use of anticoagulants: Secondary | ICD-10-CM | POA: Diagnosis not present

## 2019-02-14 ENCOUNTER — Other Ambulatory Visit: Payer: Self-pay

## 2019-02-14 NOTE — Patient Outreach (Signed)
[facility]) Care Management  02/14/2019  TAZ VANNESS 1929/09/27 [id number]   Medication Adherence call to Mr. Sabir Charters  Patients telephone number is disconnected. Patient is showing past due on Atorvastatin 20 mg under [employer].   Lillia Abed CPhT Pharmacy Technician [facility] Management Direct Dial [phone]  Fax [phone] [email or web address]

## 2019-03-13 DIAGNOSIS — R6889 Other general symptoms and signs: Secondary | ICD-10-CM | POA: Diagnosis not present

## 2019-03-13 DIAGNOSIS — I10 Essential (primary) hypertension: Secondary | ICD-10-CM | POA: Diagnosis not present

## 2019-03-13 DIAGNOSIS — D649 Anemia, unspecified: Secondary | ICD-10-CM | POA: Diagnosis not present

## 2019-03-13 DIAGNOSIS — E785 Hyperlipidemia, unspecified: Secondary | ICD-10-CM | POA: Diagnosis not present

## 2019-03-13 DIAGNOSIS — Z03818 Encounter for observation for suspected exposure to other biological agents ruled out: Secondary | ICD-10-CM | POA: Diagnosis not present

## 2019-03-13 DIAGNOSIS — R69 Illness, unspecified: Secondary | ICD-10-CM | POA: Diagnosis not present

## 2019-03-29 ENCOUNTER — Ambulatory Visit: Payer: Medicare Other | Admitting: Adult Health

## 2019-03-29 NOTE — Progress Notes (Deleted)
[facility] [address] [place]. [place] [address] [phone]       HOSPITAL FOLLOW UP NOTE  [name] Date of Birth:  1929-07-12 Medical Record Number:  [id number]   Reason for Referral:  hospital stroke follow up    CHIEF COMPLAINT:  No chief complaint on file.   HPI:  [name] is a 86 year old male who is being seen today, 03/29/2019, for stroke follow-up.  He continues to reside at [facility] in [place], [place].  Residual stroke deficits ***.  Continues on Eliquis and atorvastatin for secondary stroke prevention without side effects.  Blood pressure today ***.  No further concerns at this time.    History provided for reference purposes only Stroke admission 11/25/2018: [name] is a 86 y.o. male with history of Htn, Hld, and bilateral hearing loss  who presented on 11/25/2018 with left-sided weakness.  Stroke showed large right PCA infarcts as evidenced on due to occlusion of right proximal P2 likely related to PE with positive PFO.   He did not receive IV t-PA due to late presentation (>4.5 hours from time of onset).  MRI showed large right PCA infarcts including right occipital and right.  CTA head/neck showed occlusion of the proximal P2 segment of the right PCA, acute pulmonary emboli within the right main pulmonary artery and left upper lobar artery.  2D echo showed an EF of 60 to 65% without cardiac source of embolus identified LE venous Doppler negative.  TCD bubble study is positive PFO with Jeff Lamb degree II at rest but with at least IV with Valsalva.  LDL 29.  A1c 6.4.  Initially being treated with IV heparin and transitioned to DOAC due to evidence of acute PE and recommend lifelong usage.  Due to recommended lifelong use of AC, no indication for PFO closure at this time.  HTN stable.  Recommended decreasing dose of statin to 20 mg daily.  Other shortness factors include advanced age, former tobacco use, EtOH use and family history of  stroke but no prior history of strokes.  Residual deficits of left hemiparesis, left hemianopia and decreased left sensory.  Discharge to SNF on 11/30/2018 for ongoing therapy.  Initial visit 12/21/2018: [name] is a 86 year old male who is being seen today for hospital follow-up.  He continues to reside at [facility] in [place], [place].  Residual deficits of left hemiparesis with ataxia, left hemisensory impairment and left superior homonymous quadrantanopia.  He continues to work with PT/OT.  He is able to ambulate with Rollator walker during therapy sessions and currently in a wheelchair.  He states at times he will have difficulty controlling his left arm and leg stating "they have a mind of their own".  He does endorse left-sided burning/stinging sensation with touch or pressure.  Patient's long-term goal is to return home where he was previously living independently.  He has continued on Eliquis without bleeding or bruising.  Continues on atorvastatin without myalgias.  Blood pressure today 142/87.  No further concerns at this time.         ROS:   14 system review of systems performed and negative with exception of weakness, numbness/tingling, pain  PMH:  Past Medical History:  Diagnosis Date  . Biceps tendonitis 12/24/2010  . 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 Hypertension     PSH:  Past Surgical History:  Procedure Laterality Date  . APPENDECTOMY  31  . CATARACT EXTRACTION W/ INTRAOCULAR LENS IMPLANT  October '12   left.    Social History:  Social History   Socioeconomic History  . Marital status: Single    Spouse name: Not on file  . Number of children: 1  . Years of education: 41  . Highest education level: Not on file  Occupational History  . 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 Smoking status: Former Smoker    Packs/day: 5.00    Years: 20.00    Pack years: 100.00    Types: Cigars     Quit date: 12/22/1980    Years since quitting: 38.2  . Smokeless tobacco: Never Used  Substance and Sexual Activity  . Alcohol use: Yes    Comment: ,moderate use  . Drug use: No  . Sexual activity: Not on file  Other Topics Concern  . 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 Married ''39 - 1 year /divorced. 1 son '62. 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 Lives alone. Has cousins in the area. Son is principle contact: [name] (c) [phone].               Social Determinants of Health   Financial Resource Strain:   . Difficulty of Paying Living Expenses:   Food Insecurity:   . Worried About Programme researcher, broadcasting/film/video in the Last Year:   . Barista in the Last Year:   Transportation Needs:   . Freight forwarder (Medical):   [name] Lack of Transportation (Non-Medical):   Physical Activity:   . Days of Exercise per Week:   . Minutes of Exercise per Session:   Stress:   . Feeling of Stress :   Social Connections: Unknown  . Frequency of Communication with Friends and Family: Patient refused  . Frequency of Social Gatherings with Friends and Family: Patient refused  . Attends Religious Services: Patient refused  . Active Member of Clubs or Organizations: Patient refused  . Attends Banker Meetings: Patient refused  . Marital Status: Patient refused  Intimate Partner Violence: Unknown  . Fear of Current or Ex-Partner: Patient refused  . Emotionally Abused: Patient refused  . Physically Abused: Patient refused  . Sexually Abused: Patient refused    Family History:  Family History  Problem Relation Age of Onset  . Stroke Mother   . Hypertension Mother   . Cancer Maternal Uncle        throat cancer  . Cancer Maternal Uncle        throat cancer  . Diabetes Neg Hx   . Heart disease Neg Hx     Medications:   Current Outpatient Medications on File Prior to Visit    Medication Sig Dispense Refill  . apixaban (ELIQUIS) 5 MG TABS tablet Take 1 tablet (5 mg total) by mouth 2 (two) times daily. 60 tablet   . atorvastatin (LIPITOR) 20 MG tablet Take 1 tablet (20 mg total) by mouth daily at 6 PM.    . gabapentin (NEURONTIN) 100 MG capsule Take 1 capsule (100 mg total) by mouth at bedtime. 30 capsule 4  . hydrochlorothiazide (MICROZIDE) 12.5 MG capsule Take 12.5 mg by mouth every morning.    . Multiple Vitamin (MULTIVITAMIN WITH MINERALS) TABS tablet Take 1 tablet by mouth every morning.    . Naphazoline HCl (CLEAR  EYES OP) Place 1 drop into both eyes daily as needed (itching/dry eyes).    . potassium chloride 20 MEQ TBCR Take 10 mEq by mouth daily. 30 tablet 1  . senna-docusate (SENOKOT-S) 8.6-50 MG tablet Take 1 tablet by mouth at bedtime.     No current facility-administered medications on file prior to visit.    Allergies:  No Known Allergies   Physical Exam  There were no vitals filed for this visit. There is no height or weight on file to calculate BMI. No exam data present  Depression screen [facility] 2/9 12/21/2018  Decreased Interest 0  Down, Depressed, Hopeless 0  PHQ - 2 Score 0     General: well developed, well nourished,  pleasant elderly African-American male, seated, in no evident distress Head: head normocephalic and atraumatic.   Neck: supple with no carotid or supraclavicular bruits Cardiovascular: regular rate and rhythm, no murmurs Musculoskeletal: no deformity Skin:  no rash/petichiae Vascular:  Normal pulses all extremities   Neurologic Exam Mental Status: Awake and fully alert.   Normal speech and language.  Oriented to place and time. Recent and remote memory intact. Attention span, concentration and fund of knowledge appropriate. Mood and affect appropriate.  Cranial Nerves: Fundoscopic exam reveals sharp disc margins. Pupils equal, briskly reactive to light. Extraocular movements full without nystagmus. Visual fields left  inferior homonymous quadrantanopia. Hearing intact. Facial sensation intact. Face, tongue, palate moves normally and symmetrically.  Motor: Normal bulk and tone.  Mild left hemiparesis 4/5 with dysmetria.  Full strength right upper and lower extremity Sensory.:  Decreased light touch sensation left upper and lower extremity Coordination: Rapid alternating movements normal in all extremities except mildly diminished left hand. Finger-to-nose and heel-to-shin shows dysmetria left upper and lower extremity. Gait and Station: Deferred as patient currently in wheelchair Reflexes: 1+ and symmetric. Toes downgoing.     NIHSS  5 Modified Rankin  3    Diagnostic Data (Labs, Imaging, Testing)  Ct Angio Head W Or Wo Contrast Ct Angio Neck W Or Wo Contrast 11/25/2018 IMPRESSION:  1. Occlusion of the proximal P2 segment of the right PCA. No other intracranial arterial occlusion or high-grade stenosis.  2. Acute pulmonary emboli within the right main pulmonary artery and left upper lobar artery.  3. Aortic Atherosclerosis (ICD10-I70.0).   Ct Head Wo Contrast 11/25/2018 IMPRESSION:  1. There is focal hypodensity of the posterolateral right thalamus and adjacent cerebral pedicle (series 3, image 17) in addition to the inferior right occipital lobe (series 3, image 12, series 5, image 50). Findings are suspicious for acute to subacute edematous PCA territory infarction. MRI may be used to more sensitively assess for acute diffusion restricting infarction if desired.  2. Underlying small-vessel white matter disease and nonacute left PCA territory encephalomalacia.    Mr Brain Wo Contrast Result Date: 11/26/2018 IMPRESSION: Acute infarct right PCA territory without hemorrhage. Atrophy and chronic ischemic change as above. Chronic hemorrhage right posterolateral temporal lobe and left cerebellum.   Vas [place] Lower Extremity Venous (dvt) Result Date: 11/26/2018 No evidence of deep venous thrombosis in  the lower extremities.      Transthoracic Echocardiogram  1. Left ventricular ejection fraction, by visual estimation, is 60 to 65%. The left ventricle has normal function. Left ventricular septal wall thickness was moderately increased. Moderately increased left ventricular posterior wall thickness. There is  no left ventricular hypertrophy. 2. Elevated left ventricular end-diastolic pressure. 3. Global right ventricle has normal systolic function.The right ventricular size is normal. No  increase in right ventricular wall thickness. 4. Left atrial size was normal. 5. Right atrial size was normal. 6. The mitral valve is normal in structure. Trace mitral valve regurgitation. No evidence of mitral stenosis. 7. The tricuspid valve is normal in structure. Tricuspid valve regurgitation is mild. 8. The aortic valve is tricuspid. Aortic valve regurgitation is not visualized. Mild aortic valve sclerosis without stenosis. 9. The pulmonic valve was normal in structure. Pulmonic valve regurgitation is not visualized. 10. There is mild dilatation of the ascending aorta and of the aortic root measuring 38 mm and 8mm respectively. 11. Moderately elevated pulmonary artery systolic pressure. 12. The inferior vena cava is normal in size with greater than 50% respiratory variability, suggesting right atrial pressure of 3 mmHg. 13. Left ventricular diastolic parameters are consistent with Grade I diastolic dysfunction (impaired relaxation).   ECG - SR rate 76 BPM. (See cardiology reading for complete details)    ASSESSMENT: MEHTAB DOLBERRY is a 86 y.o. year old male presented with left-sided weakness on 11/25/2018 with stroke work-up showing large right PCA infarcts due to occlusion right proximal P2 likely related to PE due to positive PFO.  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  Vascular risk factors include HTN, HLD, PFO advanced age, former tobacco use, EtOH use and  family history of stroke.  Residual deficits include left superior homonymous quadrantanopia, mild left hemiparesis with dysmetria and left hemisensory impairment    PLAN:  1. Right PCA infarct: Continue Eliquis (apixaban) daily  and Lipitor for secondary stroke prevention. Maintain strict control of hypertension with blood pressure goal below 130/90, diabetes with hemoglobin A1c goal below 6.5% and cholesterol with LDL cholesterol (bad cholesterol) goal below 70 mg/dL.  I also advised the patient to eat a healthy diet with plenty of whole grains, cereals, fruits and vegetables, exercise regularly with at least 30 minutes of continuous activity daily and maintain ideal body weight. 2. PFO: Likely cause of stroke in setting of PE.  Initiated on lifelong AC due to unprovoked PE therefore no PFO closure indicated at this time 3. HTN: Advised to continue current treatment regimen.  Today's BP stable.  Advised to continue to monitor at home along with continued follow-up with PCP for management 4. HLD: Advised to continue current treatment regimen along with continued follow-up with PCP for future prescribing and monitoring of lipid panel 5. Residual deficits: Ongoing participation with facility therapies.  Recommend initiating gabapentin 100 mg nightly for possible improvement of post stroke pain.  Recommend facility monitor for possible increase in 2 weeks if needed.    Follow up in 3 months or call earlier if needed   Greater than 50% of time during this 45 minute visit was spent on counseling, explanation of diagnosis of right PCA stroke, reviewing risk factor management of PFO, PE, HTN and HLD, planning of further management along with potential future management, and discussion with patient answering all questions to satisfaction    Ihor Austin, AGNP-BC  [facility] [address] [place], [place] [address]  Phone [phone] Fax [phone] Note: This  document was prepared with digital dictation and possible smart phrase technology. Any transcriptional errors that result from this process are unintentional.

## 2019-04-02 ENCOUNTER — Encounter: Payer: Self-pay | Admitting: Adult Health

## 2019-05-19 DIAGNOSIS — R7303 Prediabetes: Secondary | ICD-10-CM | POA: Diagnosis not present

## 2019-05-19 DIAGNOSIS — I639 Cerebral infarction, unspecified: Secondary | ICD-10-CM | POA: Diagnosis not present

## 2019-05-19 DIAGNOSIS — M12812 Other specific arthropathies, not elsewhere classified, left shoulder: Secondary | ICD-10-CM | POA: Diagnosis not present

## 2019-05-19 DIAGNOSIS — M25512 Pain in left shoulder: Secondary | ICD-10-CM | POA: Diagnosis not present

## 2019-05-21 ENCOUNTER — Other Ambulatory Visit: Payer: Self-pay | Admitting: Internal Medicine

## 2019-05-21 ENCOUNTER — Telehealth: Payer: Self-pay | Admitting: *Deleted

## 2019-05-21 DIAGNOSIS — R531 Weakness: Secondary | ICD-10-CM | POA: Diagnosis not present

## 2019-05-21 DIAGNOSIS — I639 Cerebral infarction, unspecified: Secondary | ICD-10-CM | POA: Diagnosis not present

## 2019-05-21 NOTE — Telephone Encounter (Signed)
Rec'd msg on Patient Jeff Lamb pt was D/C 05/19/19 2:35 PM  from [facility] to Home - with Home Health Services.  Will call pt to f/u and make hosp f/u appt if need.Marland KitchenRaechel Chute

## 2019-05-21 NOTE — Telephone Encounter (Signed)
Called pt kept getting vm. So I called pt son Jeff Lamb) verified d/c from SNF. Pt has f/u appt for 05/24/19. Completed TCM call below.Jeff Lamb  Transition Care Management Follow-up Telephone Call   Date discharged? 05/19/19 from SNF   How have you been since you were released from the hospital? Spoke w/pt son pt is doing ok   Do you understand why you were in the hospital? YES   Do you understand the discharge instructions? YES   Where were you discharged to? Home   Items Reviewed:  Medications reviewed: YES, per son no changes on medication  Allergies reviewed: NO  Dietary changes reviewed: NO  Referrals reviewed: No referral recommeded   Functional Questionnaire:   Activities of Daily Living (ADLs):   He states he are independent in the following: bathing and hygiene, feeding, continence, grooming, toileting and dressing States he require assistance with the following: ambulation uses wheelchair. Son states he can walk by himself   Any transportation issues/concerns?: NO   Any patient concerns? NO   Confirmed importance and date/time of follow-up visits scheduled YES, appt 05/24/19  Provider Appointment booked with Dr. Okey Dupre  Confirmed with patient if condition begins to worsen call PCP or go to the ER.  Patient was given the office number and encouraged to call back with question or concerns.  : YES

## 2019-05-23 DIAGNOSIS — I10 Essential (primary) hypertension: Secondary | ICD-10-CM | POA: Diagnosis not present

## 2019-05-23 DIAGNOSIS — K439 Ventral hernia without obstruction or gangrene: Secondary | ICD-10-CM | POA: Diagnosis not present

## 2019-05-23 DIAGNOSIS — I2699 Other pulmonary embolism without acute cor pulmonale: Secondary | ICD-10-CM | POA: Diagnosis not present

## 2019-05-23 DIAGNOSIS — E785 Hyperlipidemia, unspecified: Secondary | ICD-10-CM | POA: Diagnosis not present

## 2019-05-23 DIAGNOSIS — I69354 Hemiplegia and hemiparesis following cerebral infarction affecting left non-dominant side: Secondary | ICD-10-CM | POA: Diagnosis not present

## 2019-05-24 ENCOUNTER — Telehealth: Payer: Self-pay

## 2019-05-24 ENCOUNTER — Encounter: Payer: Self-pay | Admitting: Internal Medicine

## 2019-05-24 ENCOUNTER — Ambulatory Visit (INDEPENDENT_AMBULATORY_CARE_PROVIDER_SITE_OTHER): Payer: Medicare Other | Admitting: Internal Medicine

## 2019-05-24 ENCOUNTER — Other Ambulatory Visit: Payer: Self-pay

## 2019-05-24 VITALS — BP 152/84 | HR 50 | Temp 98.1°F | Ht 70.0 in

## 2019-05-24 DIAGNOSIS — M79602 Pain in left arm: Secondary | ICD-10-CM

## 2019-05-24 DIAGNOSIS — I2782 Chronic pulmonary embolism: Secondary | ICD-10-CM | POA: Diagnosis not present

## 2019-05-24 DIAGNOSIS — Z8673 Personal history of transient ischemic attack (TIA), and cerebral infarction without residual deficits: Secondary | ICD-10-CM

## 2019-05-24 DIAGNOSIS — I2699 Other pulmonary embolism without acute cor pulmonale: Secondary | ICD-10-CM | POA: Diagnosis not present

## 2019-05-24 DIAGNOSIS — I10 Essential (primary) hypertension: Secondary | ICD-10-CM | POA: Diagnosis not present

## 2019-05-24 DIAGNOSIS — E785 Hyperlipidemia, unspecified: Secondary | ICD-10-CM | POA: Diagnosis not present

## 2019-05-24 DIAGNOSIS — I69354 Hemiplegia and hemiparesis following cerebral infarction affecting left non-dominant side: Secondary | ICD-10-CM | POA: Diagnosis not present

## 2019-05-24 DIAGNOSIS — K439 Ventral hernia without obstruction or gangrene: Secondary | ICD-10-CM | POA: Diagnosis not present

## 2019-05-24 MED ORDER — GABAPENTIN 100 MG PO CAPS: 100.0000 mg | ORAL_CAPSULE | Freq: Every day | ORAL | 3 refills | Status: DC

## 2019-05-24 MED ORDER — APIXABAN 5 MG PO TABS: 5.0000 mg | ORAL_TABLET | Freq: Two times a day (BID) | ORAL | 3 refills | Status: DC

## 2019-05-24 MED ORDER — ATORVASTATIN CALCIUM 20 MG PO TABS: 20.0000 mg | ORAL_TABLET | Freq: Every day | ORAL | 3 refills | Status: DC

## 2019-05-24 MED ORDER — POTASSIUM CHLORIDE ER 20 MEQ PO TBCR: 10.0000 meq | EXTENDED_RELEASE_TABLET | Freq: Every day | ORAL | 3 refills | Status: DC

## 2019-05-24 MED ORDER — HYDROCHLOROTHIAZIDE 12.5 MG PO CAPS: 12.5000 mg | ORAL_CAPSULE | Freq: Every day | ORAL | 3 refills | Status: DC

## 2019-05-24 MED ORDER — SENNOSIDES-DOCUSATE SODIUM 8.6-50 MG PO TABS: 1.0000 | ORAL_TABLET | Freq: Every day | ORAL | 3 refills | Status: AC

## 2019-05-24 NOTE — Assessment & Plan Note (Signed)
With fracture in hospital after fall. This did recover well. Taking gabapentin for some nerve pain in the left arm after the stroke which is working well, refilled today.

## 2019-05-24 NOTE — Telephone Encounter (Signed)
Please advise 

## 2019-05-24 NOTE — Assessment & Plan Note (Signed)
Prior stroke 2015 and again in 2020. He is currently starting PT/OT at home to maximize function but he may end up requiring additional care to stay at home.

## 2019-05-24 NOTE — Assessment & Plan Note (Signed)
Taking eliquis and will need to review records but this was not clearly provoked so would continue indefinite therapy at this time.

## 2019-05-24 NOTE — Patient Instructions (Signed)
We have filled out the forms for the SCAT and the handicapped sticker for you guys. You need to sign part B and fill out part A in entirety and then return.

## 2019-05-24 NOTE — Assessment & Plan Note (Signed)
BP at goal on his hctz and refilled today. Recent labs normal from [facility].

## 2019-05-24 NOTE — Telephone Encounter (Signed)
New message    Home health evaluation is complete needs verbal order for OT   One time a week for one week   two times a week for three week   One time a week for two week   Focus on independence with ADL, and increase of use of left arm during ADL tasks.

## 2019-05-24 NOTE — Progress Notes (Signed)
   Subjective:   Patient ID: Jeff Lamb, male    DOB: 04-Oct-1929, 86 y.o.   MRN: [id number]  HPI The patient is an 86 YO man coming in for follow up after discharge from nursing home (had PE and stroke Nov 2020, discharged to nursing home, released 05/19/19). Overall he is improved but still not independent. He is using wheelchair for ambulation around the house. Has a walker but not using this yet. PT/OT came by and did assessment and will be working with him. Has support at home and they are looking into more permanent aide for him. Denies fevers or chills. Denies chest pains or SOB. Denies worsening numbness or weakness. Having weakness and altered sensation left side.  PMH, [facility], social history reviewed and updated  Review of Systems  Constitutional: Positive for activity change.  HENT: Negative.   Eyes: Negative.   Respiratory: Negative for cough, chest tightness and shortness of breath.   Cardiovascular: Negative for chest pain, palpitations and leg swelling.  Gastrointestinal: Negative for abdominal distention, abdominal pain, constipation, diarrhea, nausea and vomiting.  Musculoskeletal: Negative.   Skin: Negative.   Neurological: Positive for weakness and numbness. Negative for dizziness, seizures, syncope, speech difficulty and headaches.  Psychiatric/Behavioral: Negative.     Objective:  Physical Exam Constitutional:      Appearance: He is well-developed.  HENT:     Head: Normocephalic and atraumatic.  Cardiovascular:     Rate and Rhythm: Normal rate and regular rhythm.  Pulmonary:     Effort: Pulmonary effort is normal. No respiratory distress.     Breath sounds: Normal breath sounds. No wheezing or rales.  Abdominal:     General: Bowel sounds are normal. There is no distension.     Palpations: Abdomen is soft.     Tenderness: There is no abdominal tenderness. There is no rebound.  Musculoskeletal:     Cervical back: Normal range of motion.  Skin:    General: Skin is  warm and dry.  Neurological:     Mental Status: He is alert and oriented to person, place, and time.     Cranial Nerves: Cranial nerve deficit present.     Sensory: Sensory deficit present.     Coordination: Coordination abnormal.     Comments: Wheelchair, left arm and leg with weakness and altered sensation     Vitals:   05/24/19 1325  BP: (!) 152/84  Pulse: (!) 50  Temp: 98.1 F (36.7 C)  SpO2: 96%  Height: 5\' 10"  (1.778 m)    This visit occurred during the SARS-CoV-2 public health emergency.  Safety protocols were in place, including screening questions prior to the visit, additional usage of staff PPE, and extensive cleaning of exam room while observing appropriate contact time as indicated for disinfecting solutions.   Assessment & Plan:

## 2019-05-25 DIAGNOSIS — I69354 Hemiplegia and hemiparesis following cerebral infarction affecting left non-dominant side: Secondary | ICD-10-CM | POA: Diagnosis not present

## 2019-05-25 DIAGNOSIS — I10 Essential (primary) hypertension: Secondary | ICD-10-CM | POA: Diagnosis not present

## 2019-05-25 DIAGNOSIS — E785 Hyperlipidemia, unspecified: Secondary | ICD-10-CM | POA: Diagnosis not present

## 2019-05-25 DIAGNOSIS — I2699 Other pulmonary embolism without acute cor pulmonale: Secondary | ICD-10-CM | POA: Diagnosis not present

## 2019-05-25 DIAGNOSIS — K439 Ventral hernia without obstruction or gangrene: Secondary | ICD-10-CM | POA: Diagnosis not present

## 2019-05-25 NOTE — Telephone Encounter (Signed)
Notified Michelle w/MD response../lmb 

## 2019-05-25 NOTE — Telephone Encounter (Signed)
Fine

## 2019-05-28 ENCOUNTER — Telehealth: Payer: Self-pay

## 2019-05-28 DIAGNOSIS — I10 Essential (primary) hypertension: Secondary | ICD-10-CM | POA: Diagnosis not present

## 2019-05-28 DIAGNOSIS — I69354 Hemiplegia and hemiparesis following cerebral infarction affecting left non-dominant side: Secondary | ICD-10-CM | POA: Diagnosis not present

## 2019-05-28 DIAGNOSIS — E785 Hyperlipidemia, unspecified: Secondary | ICD-10-CM | POA: Diagnosis not present

## 2019-05-28 DIAGNOSIS — I2699 Other pulmonary embolism without acute cor pulmonale: Secondary | ICD-10-CM | POA: Diagnosis not present

## 2019-05-28 DIAGNOSIS — K439 Ventral hernia without obstruction or gangrene: Secondary | ICD-10-CM | POA: Diagnosis not present

## 2019-05-28 MED ORDER — POTASSIUM CHLORIDE ER 10 MEQ PO TBCR: 10.0000 meq | EXTENDED_RELEASE_TABLET | Freq: Every day | ORAL | 3 refills | Status: DC

## 2019-05-28 NOTE — Telephone Encounter (Signed)
Done

## 2019-05-28 NOTE — Telephone Encounter (Signed)
Received fax request for clarification for Potassium Chloride 20mg .    Directions say 10mg  by month daily.  Should the prescription be written for 10mg s or keep as 20mg ?

## 2019-05-28 NOTE — Telephone Encounter (Signed)
Should be 10 mEq

## 2019-05-28 NOTE — Telephone Encounter (Signed)
Can you resubmit the prescription to the pharmacy with 10mg  instead of 20mg .

## 2019-05-29 DIAGNOSIS — K439 Ventral hernia without obstruction or gangrene: Secondary | ICD-10-CM | POA: Diagnosis not present

## 2019-05-29 DIAGNOSIS — I69354 Hemiplegia and hemiparesis following cerebral infarction affecting left non-dominant side: Secondary | ICD-10-CM | POA: Diagnosis not present

## 2019-05-29 DIAGNOSIS — I10 Essential (primary) hypertension: Secondary | ICD-10-CM | POA: Diagnosis not present

## 2019-05-29 DIAGNOSIS — I2699 Other pulmonary embolism without acute cor pulmonale: Secondary | ICD-10-CM | POA: Diagnosis not present

## 2019-05-29 DIAGNOSIS — E785 Hyperlipidemia, unspecified: Secondary | ICD-10-CM | POA: Diagnosis not present

## 2019-05-30 DIAGNOSIS — K439 Ventral hernia without obstruction or gangrene: Secondary | ICD-10-CM | POA: Diagnosis not present

## 2019-05-30 DIAGNOSIS — I2699 Other pulmonary embolism without acute cor pulmonale: Secondary | ICD-10-CM | POA: Diagnosis not present

## 2019-05-30 DIAGNOSIS — I10 Essential (primary) hypertension: Secondary | ICD-10-CM | POA: Diagnosis not present

## 2019-05-30 DIAGNOSIS — E785 Hyperlipidemia, unspecified: Secondary | ICD-10-CM | POA: Diagnosis not present

## 2019-05-30 DIAGNOSIS — I69354 Hemiplegia and hemiparesis following cerebral infarction affecting left non-dominant side: Secondary | ICD-10-CM | POA: Diagnosis not present

## 2019-05-31 DIAGNOSIS — K439 Ventral hernia without obstruction or gangrene: Secondary | ICD-10-CM | POA: Diagnosis not present

## 2019-05-31 DIAGNOSIS — I10 Essential (primary) hypertension: Secondary | ICD-10-CM | POA: Diagnosis not present

## 2019-05-31 DIAGNOSIS — I69354 Hemiplegia and hemiparesis following cerebral infarction affecting left non-dominant side: Secondary | ICD-10-CM | POA: Diagnosis not present

## 2019-05-31 DIAGNOSIS — E785 Hyperlipidemia, unspecified: Secondary | ICD-10-CM | POA: Diagnosis not present

## 2019-05-31 DIAGNOSIS — I2699 Other pulmonary embolism without acute cor pulmonale: Secondary | ICD-10-CM | POA: Diagnosis not present

## 2019-05-31 NOTE — Telephone Encounter (Signed)
Patient's family is calling stating the patient needs in home nursing care. They spoke with insurance about this and informed to call his PCP and requesting authorize so that they could get this approved. I informed family they should speak with the home health that is already coming in about this as well.

## 2019-06-01 DIAGNOSIS — I10 Essential (primary) hypertension: Secondary | ICD-10-CM | POA: Diagnosis not present

## 2019-06-01 DIAGNOSIS — E785 Hyperlipidemia, unspecified: Secondary | ICD-10-CM | POA: Diagnosis not present

## 2019-06-01 DIAGNOSIS — K439 Ventral hernia without obstruction or gangrene: Secondary | ICD-10-CM | POA: Diagnosis not present

## 2019-06-01 DIAGNOSIS — I69354 Hemiplegia and hemiparesis following cerebral infarction affecting left non-dominant side: Secondary | ICD-10-CM | POA: Diagnosis not present

## 2019-06-01 DIAGNOSIS — I2699 Other pulmonary embolism without acute cor pulmonale: Secondary | ICD-10-CM | POA: Diagnosis not present

## 2019-06-01 NOTE — Telephone Encounter (Signed)
Noted  

## 2019-06-04 DIAGNOSIS — I10 Essential (primary) hypertension: Secondary | ICD-10-CM | POA: Diagnosis not present

## 2019-06-04 DIAGNOSIS — K439 Ventral hernia without obstruction or gangrene: Secondary | ICD-10-CM | POA: Diagnosis not present

## 2019-06-04 DIAGNOSIS — I2699 Other pulmonary embolism without acute cor pulmonale: Secondary | ICD-10-CM | POA: Diagnosis not present

## 2019-06-04 DIAGNOSIS — I69354 Hemiplegia and hemiparesis following cerebral infarction affecting left non-dominant side: Secondary | ICD-10-CM | POA: Diagnosis not present

## 2019-06-04 DIAGNOSIS — E785 Hyperlipidemia, unspecified: Secondary | ICD-10-CM | POA: Diagnosis not present

## 2019-06-04 NOTE — Telephone Encounter (Signed)
Spoke with his cousin [name] she said that the patient needs to an Engineer, building services.  She is currently helping care for him and it is very difficult she is doing it alone.  [name] said she is a Designer, jewellery herself.   Patient has requested to go to [facility] on [address].  FL2 is needed

## 2019-06-04 NOTE — Telephone Encounter (Signed)
New message:    Pt's cousin/caregiver is calling and would like to speak with the assistant or Dr in regards to the patients state as of right now.

## 2019-06-04 NOTE — Telephone Encounter (Signed)
Can you get an FL-2 started and communicate with [name] with his functional status for the middle section and I will finish and sign?

## 2019-06-05 DIAGNOSIS — K439 Ventral hernia without obstruction or gangrene: Secondary | ICD-10-CM | POA: Diagnosis not present

## 2019-06-05 DIAGNOSIS — I69354 Hemiplegia and hemiparesis following cerebral infarction affecting left non-dominant side: Secondary | ICD-10-CM | POA: Diagnosis not present

## 2019-06-05 DIAGNOSIS — E785 Hyperlipidemia, unspecified: Secondary | ICD-10-CM | POA: Diagnosis not present

## 2019-06-05 DIAGNOSIS — I10 Essential (primary) hypertension: Secondary | ICD-10-CM | POA: Diagnosis not present

## 2019-06-05 DIAGNOSIS — I2699 Other pulmonary embolism without acute cor pulmonale: Secondary | ICD-10-CM | POA: Diagnosis not present

## 2019-06-06 DIAGNOSIS — E785 Hyperlipidemia, unspecified: Secondary | ICD-10-CM | POA: Diagnosis not present

## 2019-06-06 DIAGNOSIS — I69354 Hemiplegia and hemiparesis following cerebral infarction affecting left non-dominant side: Secondary | ICD-10-CM | POA: Diagnosis not present

## 2019-06-06 DIAGNOSIS — K439 Ventral hernia without obstruction or gangrene: Secondary | ICD-10-CM | POA: Diagnosis not present

## 2019-06-06 DIAGNOSIS — I2699 Other pulmonary embolism without acute cor pulmonale: Secondary | ICD-10-CM | POA: Diagnosis not present

## 2019-06-06 DIAGNOSIS — I10 Essential (primary) hypertension: Secondary | ICD-10-CM | POA: Diagnosis not present

## 2019-06-06 NOTE — Telephone Encounter (Signed)
Form filled out waiting on doctor signature.  Will leave up front.  [name] will come by to pick up on 06-08-2019

## 2019-06-07 DIAGNOSIS — I2699 Other pulmonary embolism without acute cor pulmonale: Secondary | ICD-10-CM | POA: Diagnosis not present

## 2019-06-07 DIAGNOSIS — E785 Hyperlipidemia, unspecified: Secondary | ICD-10-CM | POA: Diagnosis not present

## 2019-06-07 DIAGNOSIS — I69354 Hemiplegia and hemiparesis following cerebral infarction affecting left non-dominant side: Secondary | ICD-10-CM | POA: Diagnosis not present

## 2019-06-07 DIAGNOSIS — I10 Essential (primary) hypertension: Secondary | ICD-10-CM | POA: Diagnosis not present

## 2019-06-07 DIAGNOSIS — K439 Ventral hernia without obstruction or gangrene: Secondary | ICD-10-CM | POA: Diagnosis not present

## 2019-06-08 ENCOUNTER — Telehealth: Payer: Self-pay | Admitting: Internal Medicine

## 2019-06-08 NOTE — Telephone Encounter (Signed)
New message:   Pt's sister is calling and states she has picked up the form but there is no recommendation that the pt should be living in a facility. Please advise.

## 2019-06-08 NOTE — Telephone Encounter (Signed)
On form Assisted Living is to be checked and hearing is to be checked. Sister to stop back by office to check these boxes

## 2019-06-12 DIAGNOSIS — I10 Essential (primary) hypertension: Secondary | ICD-10-CM | POA: Diagnosis not present

## 2019-06-12 DIAGNOSIS — I2699 Other pulmonary embolism without acute cor pulmonale: Secondary | ICD-10-CM | POA: Diagnosis not present

## 2019-06-12 DIAGNOSIS — E785 Hyperlipidemia, unspecified: Secondary | ICD-10-CM | POA: Diagnosis not present

## 2019-06-12 DIAGNOSIS — I69354 Hemiplegia and hemiparesis following cerebral infarction affecting left non-dominant side: Secondary | ICD-10-CM | POA: Diagnosis not present

## 2019-06-12 DIAGNOSIS — K439 Ventral hernia without obstruction or gangrene: Secondary | ICD-10-CM | POA: Diagnosis not present

## 2019-06-13 ENCOUNTER — Telehealth: Payer: Self-pay

## 2019-06-13 DIAGNOSIS — I2699 Other pulmonary embolism without acute cor pulmonale: Secondary | ICD-10-CM | POA: Diagnosis not present

## 2019-06-13 DIAGNOSIS — I10 Essential (primary) hypertension: Secondary | ICD-10-CM | POA: Diagnosis not present

## 2019-06-13 DIAGNOSIS — I69354 Hemiplegia and hemiparesis following cerebral infarction affecting left non-dominant side: Secondary | ICD-10-CM | POA: Diagnosis not present

## 2019-06-13 DIAGNOSIS — K439 Ventral hernia without obstruction or gangrene: Secondary | ICD-10-CM | POA: Diagnosis not present

## 2019-06-13 DIAGNOSIS — E785 Hyperlipidemia, unspecified: Secondary | ICD-10-CM | POA: Diagnosis not present

## 2019-06-13 NOTE — Telephone Encounter (Signed)
New message   Elisa's cousin whose is not the POA calling asking for the next steps.   The son lives out of state and aware of what's going on with his father.   The patient will be home alone.  The patient refused nursing home placement, asking for a call back to discuss further.

## 2019-06-14 DIAGNOSIS — I69354 Hemiplegia and hemiparesis following cerebral infarction affecting left non-dominant side: Secondary | ICD-10-CM | POA: Diagnosis not present

## 2019-06-14 DIAGNOSIS — E785 Hyperlipidemia, unspecified: Secondary | ICD-10-CM | POA: Diagnosis not present

## 2019-06-14 DIAGNOSIS — I2699 Other pulmonary embolism without acute cor pulmonale: Secondary | ICD-10-CM | POA: Diagnosis not present

## 2019-06-14 DIAGNOSIS — K439 Ventral hernia without obstruction or gangrene: Secondary | ICD-10-CM | POA: Diagnosis not present

## 2019-06-14 DIAGNOSIS — I10 Essential (primary) hypertension: Secondary | ICD-10-CM | POA: Diagnosis not present

## 2019-06-14 NOTE — Telephone Encounter (Signed)
Spoke with Jeff Lamb American patients cousin. Her mom [name] has been the patients primary caregiver and she has became unable to care for him herself  The patient  refuses to go to a nursing home or assisted living anywhere to receive any type of help.   They want to know what can they do to help find him placement that will accept him even thought he refuses.

## 2019-06-14 NOTE — Telephone Encounter (Signed)
Legally if he has capacity to decide to go or not to go to assisted living/nursing home we cannot force him and he would be able to sign himself out if placed there. They could consider in home care if he will agree to that although typically insurance does not help with that. Another option would be having him agree to move in with son out of state if son is able to help with care.

## 2019-06-15 NOTE — Telephone Encounter (Signed)
Spoke with Rodell Perna explained message voiced understanding.

## 2019-06-18 DIAGNOSIS — E785 Hyperlipidemia, unspecified: Secondary | ICD-10-CM | POA: Diagnosis not present

## 2019-06-18 DIAGNOSIS — K439 Ventral hernia without obstruction or gangrene: Secondary | ICD-10-CM | POA: Diagnosis not present

## 2019-06-18 DIAGNOSIS — I10 Essential (primary) hypertension: Secondary | ICD-10-CM | POA: Diagnosis not present

## 2019-06-18 DIAGNOSIS — I69354 Hemiplegia and hemiparesis following cerebral infarction affecting left non-dominant side: Secondary | ICD-10-CM | POA: Diagnosis not present

## 2019-06-18 DIAGNOSIS — I2699 Other pulmonary embolism without acute cor pulmonale: Secondary | ICD-10-CM | POA: Diagnosis not present

## 2019-06-19 ENCOUNTER — Telehealth: Payer: Self-pay | Admitting: Internal Medicine

## 2019-06-19 DIAGNOSIS — K439 Ventral hernia without obstruction or gangrene: Secondary | ICD-10-CM | POA: Diagnosis not present

## 2019-06-19 DIAGNOSIS — I2699 Other pulmonary embolism without acute cor pulmonale: Secondary | ICD-10-CM | POA: Diagnosis not present

## 2019-06-19 DIAGNOSIS — I69354 Hemiplegia and hemiparesis following cerebral infarction affecting left non-dominant side: Secondary | ICD-10-CM | POA: Diagnosis not present

## 2019-06-19 DIAGNOSIS — I10 Essential (primary) hypertension: Secondary | ICD-10-CM | POA: Diagnosis not present

## 2019-06-19 DIAGNOSIS — E785 Hyperlipidemia, unspecified: Secondary | ICD-10-CM | POA: Diagnosis not present

## 2019-06-19 NOTE — Telephone Encounter (Signed)
Fine

## 2019-06-19 NOTE — Telephone Encounter (Signed)
New message:   Jeff Lamb is calling from [facility] health to get verbal orders to extend the pt's OT for 1 x a week for 3 weeks. Please advise.

## 2019-06-19 NOTE — Telephone Encounter (Signed)
Verbal orders given  

## 2019-06-20 DIAGNOSIS — E785 Hyperlipidemia, unspecified: Secondary | ICD-10-CM | POA: Diagnosis not present

## 2019-06-20 DIAGNOSIS — I2699 Other pulmonary embolism without acute cor pulmonale: Secondary | ICD-10-CM | POA: Diagnosis not present

## 2019-06-20 DIAGNOSIS — K439 Ventral hernia without obstruction or gangrene: Secondary | ICD-10-CM | POA: Diagnosis not present

## 2019-06-20 DIAGNOSIS — I69354 Hemiplegia and hemiparesis following cerebral infarction affecting left non-dominant side: Secondary | ICD-10-CM | POA: Diagnosis not present

## 2019-06-20 DIAGNOSIS — I10 Essential (primary) hypertension: Secondary | ICD-10-CM | POA: Diagnosis not present

## 2019-06-21 DIAGNOSIS — I639 Cerebral infarction, unspecified: Secondary | ICD-10-CM | POA: Diagnosis not present

## 2019-06-26 DIAGNOSIS — I10 Essential (primary) hypertension: Secondary | ICD-10-CM | POA: Diagnosis not present

## 2019-06-26 DIAGNOSIS — K439 Ventral hernia without obstruction or gangrene: Secondary | ICD-10-CM | POA: Diagnosis not present

## 2019-06-26 DIAGNOSIS — I69354 Hemiplegia and hemiparesis following cerebral infarction affecting left non-dominant side: Secondary | ICD-10-CM | POA: Diagnosis not present

## 2019-06-26 DIAGNOSIS — I2699 Other pulmonary embolism without acute cor pulmonale: Secondary | ICD-10-CM | POA: Diagnosis not present

## 2019-06-26 DIAGNOSIS — E785 Hyperlipidemia, unspecified: Secondary | ICD-10-CM | POA: Diagnosis not present

## 2019-06-27 DIAGNOSIS — I69354 Hemiplegia and hemiparesis following cerebral infarction affecting left non-dominant side: Secondary | ICD-10-CM | POA: Diagnosis not present

## 2019-06-27 DIAGNOSIS — I2699 Other pulmonary embolism without acute cor pulmonale: Secondary | ICD-10-CM | POA: Diagnosis not present

## 2019-06-27 DIAGNOSIS — E785 Hyperlipidemia, unspecified: Secondary | ICD-10-CM | POA: Diagnosis not present

## 2019-06-27 DIAGNOSIS — K439 Ventral hernia without obstruction or gangrene: Secondary | ICD-10-CM | POA: Diagnosis not present

## 2019-06-27 DIAGNOSIS — I10 Essential (primary) hypertension: Secondary | ICD-10-CM | POA: Diagnosis not present

## 2019-06-29 ENCOUNTER — Telehealth: Payer: Self-pay | Admitting: Internal Medicine

## 2019-06-29 DIAGNOSIS — I10 Essential (primary) hypertension: Secondary | ICD-10-CM | POA: Diagnosis not present

## 2019-06-29 DIAGNOSIS — E785 Hyperlipidemia, unspecified: Secondary | ICD-10-CM | POA: Diagnosis not present

## 2019-06-29 DIAGNOSIS — I2699 Other pulmonary embolism without acute cor pulmonale: Secondary | ICD-10-CM | POA: Diagnosis not present

## 2019-06-29 DIAGNOSIS — K439 Ventral hernia without obstruction or gangrene: Secondary | ICD-10-CM | POA: Diagnosis not present

## 2019-06-29 DIAGNOSIS — I69354 Hemiplegia and hemiparesis following cerebral infarction affecting left non-dominant side: Secondary | ICD-10-CM | POA: Diagnosis not present

## 2019-06-29 NOTE — Telephone Encounter (Signed)
Fine

## 2019-06-29 NOTE — Telephone Encounter (Signed)
Verbal orders given to Kelly 

## 2019-06-29 NOTE — Telephone Encounter (Signed)
    Requesting verbal orders for social worker to help with safety concerns Phone [phone]

## 2019-07-02 DIAGNOSIS — E785 Hyperlipidemia, unspecified: Secondary | ICD-10-CM | POA: Diagnosis not present

## 2019-07-02 DIAGNOSIS — I69354 Hemiplegia and hemiparesis following cerebral infarction affecting left non-dominant side: Secondary | ICD-10-CM | POA: Diagnosis not present

## 2019-07-02 DIAGNOSIS — I2699 Other pulmonary embolism without acute cor pulmonale: Secondary | ICD-10-CM | POA: Diagnosis not present

## 2019-07-02 DIAGNOSIS — K439 Ventral hernia without obstruction or gangrene: Secondary | ICD-10-CM | POA: Diagnosis not present

## 2019-07-02 DIAGNOSIS — I10 Essential (primary) hypertension: Secondary | ICD-10-CM | POA: Diagnosis not present

## 2019-07-03 DIAGNOSIS — I10 Essential (primary) hypertension: Secondary | ICD-10-CM | POA: Diagnosis not present

## 2019-07-03 DIAGNOSIS — K439 Ventral hernia without obstruction or gangrene: Secondary | ICD-10-CM | POA: Diagnosis not present

## 2019-07-03 DIAGNOSIS — E785 Hyperlipidemia, unspecified: Secondary | ICD-10-CM | POA: Diagnosis not present

## 2019-07-03 DIAGNOSIS — I2699 Other pulmonary embolism without acute cor pulmonale: Secondary | ICD-10-CM | POA: Diagnosis not present

## 2019-07-03 DIAGNOSIS — I69354 Hemiplegia and hemiparesis following cerebral infarction affecting left non-dominant side: Secondary | ICD-10-CM | POA: Diagnosis not present

## 2019-07-05 DIAGNOSIS — I69354 Hemiplegia and hemiparesis following cerebral infarction affecting left non-dominant side: Secondary | ICD-10-CM | POA: Diagnosis not present

## 2019-07-05 DIAGNOSIS — K439 Ventral hernia without obstruction or gangrene: Secondary | ICD-10-CM | POA: Diagnosis not present

## 2019-07-05 DIAGNOSIS — E785 Hyperlipidemia, unspecified: Secondary | ICD-10-CM | POA: Diagnosis not present

## 2019-07-05 DIAGNOSIS — I2699 Other pulmonary embolism without acute cor pulmonale: Secondary | ICD-10-CM | POA: Diagnosis not present

## 2019-07-05 DIAGNOSIS — I10 Essential (primary) hypertension: Secondary | ICD-10-CM | POA: Diagnosis not present

## 2019-07-09 DIAGNOSIS — E785 Hyperlipidemia, unspecified: Secondary | ICD-10-CM | POA: Diagnosis not present

## 2019-07-09 DIAGNOSIS — I2699 Other pulmonary embolism without acute cor pulmonale: Secondary | ICD-10-CM | POA: Diagnosis not present

## 2019-07-09 DIAGNOSIS — K439 Ventral hernia without obstruction or gangrene: Secondary | ICD-10-CM | POA: Diagnosis not present

## 2019-07-09 DIAGNOSIS — I10 Essential (primary) hypertension: Secondary | ICD-10-CM | POA: Diagnosis not present

## 2019-07-09 DIAGNOSIS — I69354 Hemiplegia and hemiparesis following cerebral infarction affecting left non-dominant side: Secondary | ICD-10-CM | POA: Diagnosis not present

## 2019-07-11 DIAGNOSIS — I2699 Other pulmonary embolism without acute cor pulmonale: Secondary | ICD-10-CM | POA: Diagnosis not present

## 2019-07-11 DIAGNOSIS — K439 Ventral hernia without obstruction or gangrene: Secondary | ICD-10-CM | POA: Diagnosis not present

## 2019-07-11 DIAGNOSIS — I69354 Hemiplegia and hemiparesis following cerebral infarction affecting left non-dominant side: Secondary | ICD-10-CM | POA: Diagnosis not present

## 2019-07-11 DIAGNOSIS — E785 Hyperlipidemia, unspecified: Secondary | ICD-10-CM | POA: Diagnosis not present

## 2019-07-11 DIAGNOSIS — I10 Essential (primary) hypertension: Secondary | ICD-10-CM | POA: Diagnosis not present

## 2019-07-12 DIAGNOSIS — I10 Essential (primary) hypertension: Secondary | ICD-10-CM | POA: Diagnosis not present

## 2019-07-12 DIAGNOSIS — E785 Hyperlipidemia, unspecified: Secondary | ICD-10-CM | POA: Diagnosis not present

## 2019-07-12 DIAGNOSIS — I2699 Other pulmonary embolism without acute cor pulmonale: Secondary | ICD-10-CM | POA: Diagnosis not present

## 2019-07-12 DIAGNOSIS — K439 Ventral hernia without obstruction or gangrene: Secondary | ICD-10-CM | POA: Diagnosis not present

## 2019-07-12 DIAGNOSIS — I69354 Hemiplegia and hemiparesis following cerebral infarction affecting left non-dominant side: Secondary | ICD-10-CM | POA: Diagnosis not present

## 2019-07-13 DIAGNOSIS — I10 Essential (primary) hypertension: Secondary | ICD-10-CM | POA: Diagnosis not present

## 2019-07-13 DIAGNOSIS — I69354 Hemiplegia and hemiparesis following cerebral infarction affecting left non-dominant side: Secondary | ICD-10-CM | POA: Diagnosis not present

## 2019-07-13 DIAGNOSIS — E785 Hyperlipidemia, unspecified: Secondary | ICD-10-CM | POA: Diagnosis not present

## 2019-07-13 DIAGNOSIS — K439 Ventral hernia without obstruction or gangrene: Secondary | ICD-10-CM | POA: Diagnosis not present

## 2019-07-13 DIAGNOSIS — I2699 Other pulmonary embolism without acute cor pulmonale: Secondary | ICD-10-CM | POA: Diagnosis not present

## 2019-07-16 DIAGNOSIS — I2699 Other pulmonary embolism without acute cor pulmonale: Secondary | ICD-10-CM | POA: Diagnosis not present

## 2019-07-16 DIAGNOSIS — K439 Ventral hernia without obstruction or gangrene: Secondary | ICD-10-CM | POA: Diagnosis not present

## 2019-07-16 DIAGNOSIS — E785 Hyperlipidemia, unspecified: Secondary | ICD-10-CM | POA: Diagnosis not present

## 2019-07-16 DIAGNOSIS — I10 Essential (primary) hypertension: Secondary | ICD-10-CM | POA: Diagnosis not present

## 2019-07-16 DIAGNOSIS — I69354 Hemiplegia and hemiparesis following cerebral infarction affecting left non-dominant side: Secondary | ICD-10-CM | POA: Diagnosis not present

## 2019-07-18 DIAGNOSIS — K439 Ventral hernia without obstruction or gangrene: Secondary | ICD-10-CM | POA: Diagnosis not present

## 2019-07-18 DIAGNOSIS — I10 Essential (primary) hypertension: Secondary | ICD-10-CM | POA: Diagnosis not present

## 2019-07-18 DIAGNOSIS — I69354 Hemiplegia and hemiparesis following cerebral infarction affecting left non-dominant side: Secondary | ICD-10-CM | POA: Diagnosis not present

## 2019-07-18 DIAGNOSIS — E785 Hyperlipidemia, unspecified: Secondary | ICD-10-CM | POA: Diagnosis not present

## 2019-07-18 DIAGNOSIS — I2699 Other pulmonary embolism without acute cor pulmonale: Secondary | ICD-10-CM | POA: Diagnosis not present

## 2019-07-18 DIAGNOSIS — R7303 Prediabetes: Secondary | ICD-10-CM

## 2019-07-18 DIAGNOSIS — Z7901 Long term (current) use of anticoagulants: Secondary | ICD-10-CM

## 2019-07-18 DIAGNOSIS — Z9181 History of falling: Secondary | ICD-10-CM

## 2019-07-18 DIAGNOSIS — K5900 Constipation, unspecified: Secondary | ICD-10-CM

## 2019-07-18 DIAGNOSIS — H9193 Unspecified hearing loss, bilateral: Secondary | ICD-10-CM

## 2019-07-20 DIAGNOSIS — K439 Ventral hernia without obstruction or gangrene: Secondary | ICD-10-CM | POA: Diagnosis not present

## 2019-07-20 DIAGNOSIS — I69354 Hemiplegia and hemiparesis following cerebral infarction affecting left non-dominant side: Secondary | ICD-10-CM | POA: Diagnosis not present

## 2019-07-20 DIAGNOSIS — I2699 Other pulmonary embolism without acute cor pulmonale: Secondary | ICD-10-CM | POA: Diagnosis not present

## 2019-07-20 DIAGNOSIS — E785 Hyperlipidemia, unspecified: Secondary | ICD-10-CM | POA: Diagnosis not present

## 2019-07-20 DIAGNOSIS — I10 Essential (primary) hypertension: Secondary | ICD-10-CM | POA: Diagnosis not present

## 2019-07-21 DIAGNOSIS — I639 Cerebral infarction, unspecified: Secondary | ICD-10-CM | POA: Diagnosis not present

## 2019-08-21 DIAGNOSIS — I639 Cerebral infarction, unspecified: Secondary | ICD-10-CM | POA: Diagnosis not present

## 2019-08-30 ENCOUNTER — Telehealth: Payer: Self-pay

## 2019-08-30 NOTE — Telephone Encounter (Signed)
Unfortunately needs OV as last visit was > 90 days

## 2019-08-30 NOTE — Telephone Encounter (Signed)
New message   Allayne Butcher calling need referral to [facility] home health for PT/ OT / Nursing / Nursing assistance bathing.

## 2019-09-04 NOTE — Telephone Encounter (Signed)
Spoke with pts cousin and informed her of Dr.Lindy's note. Pts cousin has stated that she will call the clinic back once she knows about a date and time she is available to bring the pt in.

## 2019-09-21 ENCOUNTER — Ambulatory Visit: Payer: Medicare Other | Admitting: Internal Medicine

## 2019-09-21 DIAGNOSIS — I639 Cerebral infarction, unspecified: Secondary | ICD-10-CM | POA: Diagnosis not present

## 2019-09-27 ENCOUNTER — Other Ambulatory Visit: Payer: Self-pay

## 2019-09-27 ENCOUNTER — Encounter: Payer: Self-pay | Admitting: Internal Medicine

## 2019-09-27 ENCOUNTER — Ambulatory Visit (INDEPENDENT_AMBULATORY_CARE_PROVIDER_SITE_OTHER): Payer: Medicare Other | Admitting: Internal Medicine

## 2019-09-27 VITALS — BP 160/80 | HR 60 | Temp 98.7°F | Ht 70.0 in | Wt 166.0 lb

## 2019-09-27 DIAGNOSIS — M25562 Pain in left knee: Secondary | ICD-10-CM | POA: Diagnosis not present

## 2019-09-27 DIAGNOSIS — Z8673 Personal history of transient ischemic attack (TIA), and cerebral infarction without residual deficits: Secondary | ICD-10-CM

## 2019-09-27 DIAGNOSIS — I2782 Chronic pulmonary embolism: Secondary | ICD-10-CM | POA: Diagnosis not present

## 2019-09-27 DIAGNOSIS — I10 Essential (primary) hypertension: Secondary | ICD-10-CM

## 2019-09-27 MED ORDER — AMLODIPINE BESYLATE 2.5 MG PO TABS: 2.5000 mg | ORAL_TABLET | Freq: Every day | ORAL | 3 refills | Status: DC

## 2019-09-27 NOTE — Assessment & Plan Note (Signed)
Pt to check with pharmacy to see if xarelto is less expensive than eliquis

## 2019-09-27 NOTE — Assessment & Plan Note (Addendum)
C/w possible djd vs meniscus tear , for pain control and sports medicine f/u  I spent 31 minutes in preparing to see the patient by review of recent labs, imaging and procedures, obtaining and reviewing separately obtained history, communicating with the patient and family or caregiver, ordering medications, tests or procedures, and documenting clinical information in the EHR including the differential Dx, treatment, and any further evaluation and other management of left knee pain, hx of stroke, hx of pE on eliquis, htn

## 2019-09-27 NOTE — Progress Notes (Signed)
Subjective:    Patient ID: Jeff Lamb, male    DOB: Nov 15, 1929, 86 y.o.   MRN: [id number]  HPI  Here with family with c/o 2 mo intermittent now severe left knee pain swelling then distal LLE swelling as well without pain; no fever, trauma and Pt denies chest pain, increased sob or doe, wheezing, orthopnea, PND, increased LE swelling, palpitations, dizziness or syncope.  Left knee symptoms started after a fall. Seems to feel mostly like severe pain under the knee cap.  Had plain films at urgent care negative.  advil helps as he can tolerate the pain during the day, and only has to use advil 200 mg qhs prn.  Pt denies new neurological symptoms such as new headache, or facial or extremity weakness or numbness   Pt denies polydipsia, polyuria, BP Readings from Last 3 Encounters:  09/27/19 (!) 160/80  05/24/19 (!) 152/84  12/21/18 (!) 142/87  BP remains high, maybe getting higher recently.  Also c/o cost of eliquis but not really wanting coumadin.  Frances Furbish has now stopped coming to the home about 6 wks ago and family asks for restart.   Past Medical History:  Diagnosis Date  . Biceps tendonitis 12/24/2010  . 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 Hypertension    Past Surgical History:  Procedure Laterality Date  . APPENDECTOMY     31  . CATARACT EXTRACTION W/ INTRAOCULAR LENS IMPLANT  October '12   left.    reports that he quit smoking about 38 years ago. His smoking use included cigars. He has a 100.00 pack-year smoking history. He has never used smokeless tobacco. He reports current alcohol use. He reports that he does not use drugs. family history includes Cancer in his maternal uncle and maternal uncle; Hypertension in his mother; Stroke in his mother. No Known Allergies Current Outpatient Medications on File Prior to Visit  Medication Sig Dispense Refill  . apixaban (ELIQUIS) 5 MG TABS tablet Take 1 tablet (5 mg total) by mouth 2 (two) times daily.  180 tablet 3  . atorvastatin (LIPITOR) 20 MG tablet Take 1 tablet (20 mg total) by mouth daily at 6 PM. 90 tablet 3  . gabapentin (NEURONTIN) 100 MG capsule Take 1 capsule (100 mg total) by mouth at bedtime. 90 capsule 3  . hydrochlorothiazide (MICROZIDE) 12.5 MG capsule Take 1 capsule (12.5 mg total) by mouth daily. Follow-up appt is due in must see provider for future refills 90 capsule 3  . Multiple Vitamin (MULTIVITAMIN WITH MINERALS) TABS tablet Take 1 tablet by mouth every morning.    . Naphazoline HCl (CLEAR EYES OP) Place 1 drop into both eyes daily as needed (itching/dry eyes).    . potassium chloride (KLOR-CON) 10 MEQ tablet Take 1 tablet (10 mEq total) by mouth daily. 90 tablet 3  . senna-docusate (SENOKOT-S) 8.6-50 MG tablet Take 1 tablet by mouth at bedtime. 90 tablet 3   No current facility-administered medications on file prior to visit.   Review of Systems All otherwise neg per pt    Objective:   Physical Exam BP (!) 160/80 (BP Location: Right Arm, Patient Position: Sitting, Cuff Size: Normal)   Pulse 60   Temp 98.7 F (37.1 C) (Oral)   Ht 5\' 10"  (1.778 m)   Wt 166 lb (75.3 kg)   SpO2 97%   BMI 23.82 kg/m  VS noted,  Constitutional: Pt appears in NAD HENT: Head: NCAT.  Right  Ear: External ear normal.  Left Ear: External ear normal.  Eyes: . Pupils are equal, round, and reactive to light. Conjunctivae and EOM are normal Nose: without d/c or deformity Neck: Neck supple. Gross normal ROM Cardiovascular: Normal rate and regular rhythm.   Pulmonary/Chest: Effort normal and breath sounds without rales or wheezing.  Left knee and leg with 1+ effusion/swelling without erythema or tenderness Neurological: Pt is alert. At baseline orientation, motor grossly intact Skin: Skin is warm. No rashes, other new lesions, no LE edema Psychiatric: Pt behavior is normal without agitation  All otherwise neg per pt Lab Results  Component Value Date   WBC 8.5 12/01/2018   HGB 16.4  12/01/2018   HCT 50.5 12/01/2018   PLT 214 12/01/2018   GLUCOSE 78 12/01/2018   CHOL 78 11/26/2018   TRIG 16 11/26/2018   HDL 46 11/26/2018   LDLCALC 29 11/26/2018   ALT 20 12/01/2018   AST 21 12/01/2018   NA 142 12/01/2018   K 3.0 (L) 12/01/2018   CL 114 (H) 12/01/2018   CREATININE 0.98 12/01/2018   BUN 12 12/01/2018   CO2 20 (L) 12/01/2018   INR 1.1 11/26/2018   HGBA1C 6.4 (H) 11/26/2018      Assessment & Plan:

## 2019-09-27 NOTE — Assessment & Plan Note (Signed)
Ok for [facility] with PT, OT, aide but family warned that hH is obliged to leave if pt is not making progress

## 2019-09-27 NOTE — Assessment & Plan Note (Signed)
Mild uncontrolled, for add amlodipine 2.5 qd

## 2019-09-27 NOTE — Patient Instructions (Signed)
Please take all new medication as prescribed - the amlodipine 2.5 mg per day for blood pressure  Please ask at the pharmacy if Xarelto is less expensive with your insurance at the pharmacy;  If it is less expensive, let us know and we can change this.  Please continue all other medications as before, including the advil at night for the left knee pain  Please stop at the FIRST FLOOR (Sports Medicine) to make an appt for the left knee  Please have the pharmacy call with any other refills you may need.  Please continue your efforts at being more active, low cholesterol diet, and weight control.  Please keep your appointments with your specialists as you may have planned  You will be contacted regarding the referral for: [facility] with aide, PT/OT

## 2019-10-02 ENCOUNTER — Ambulatory Visit: Payer: Medicare Other | Admitting: Family Medicine

## 2019-10-08 ENCOUNTER — Other Ambulatory Visit: Payer: Self-pay

## 2019-10-08 ENCOUNTER — Ambulatory Visit: Payer: Medicare Other | Admitting: Family Medicine

## 2019-10-08 ENCOUNTER — Encounter: Payer: Self-pay | Admitting: Family Medicine

## 2019-10-08 ENCOUNTER — Ambulatory Visit (INDEPENDENT_AMBULATORY_CARE_PROVIDER_SITE_OTHER): Payer: Medicare Other

## 2019-10-08 ENCOUNTER — Ambulatory Visit: Payer: Self-pay

## 2019-10-08 VITALS — BP 132/80 | HR 82 | Ht 70.0 in | Wt 166.0 lb

## 2019-10-08 DIAGNOSIS — M25562 Pain in left knee: Secondary | ICD-10-CM | POA: Diagnosis not present

## 2019-10-08 DIAGNOSIS — M1712 Unilateral primary osteoarthritis, left knee: Secondary | ICD-10-CM | POA: Diagnosis not present

## 2019-10-08 NOTE — Progress Notes (Signed)
    Subjective:    CC: L knee pain  I, Molly Weber, LAT, ATC, am serving as scribe for Dr. [name].  HPI: Pt is an 87 y/o male presenting w/ c/o L knee pain and swelling after suffering a fall in May 2021.  He locates his pain to his L ant knee.  Pt has a hx of stroke causing left-sided weakness in his arm and leg.  PCP ordered home health physical therapy 2 weeks ago and they have yet to hear from them.  L knee swelling: No but does have L ankle swelling Aggravating factors: standing and walking Treatments tried: No  Pertinent review of Systems: No fevers or chills  Relevant historical information: Pulmonary embolism.  History of stroke   Objective:    Vitals:   10/08/19 1411  BP: 132/80  Pulse: 82  SpO2: 95%   General: Well Developed, well nourished, and in no acute distress.   MSK: Left knee decreased quad bulk left compared to right otherwise normal-appearing No particular tender palpation. Normal motion. Decrease strength to knee extension.  Knee flexion strength is intact. Stable ligamentous exam.  Lab and Radiology Results  X-ray images left knee obtained today personally and independently interpreted. DJD present tricompartmental worse medial compartment.  No fractures visible. Await formal radiology review  Impression and Recommendations:    Assessment and Plan: 86 y.o. male with left knee pain due to some degenerative changes and a fall occurring 6 months ago..  No acute fractures seen on x-ray today but patient did have degenerative changes.  Discussed options.  Plan for Voltaren gel and home health physical therapy as ordered by PCP.  If not improving return to clinic.  Neck step is probably steroid injection.  Patient would like to avoid injection if possible  PDMP not reviewed this encounter. Orders Placed This Encounter  Procedures  . DG Knee AP/LAT W/Sunrise Left    Standing Status:   Future    Number of Occurrences:   1    Standing  Expiration Date:   10/07/2020    Order Specific Question:   Reason for Exam (SYMPTOM  OR DIAGNOSIS REQUIRED)    Answer:   eval left knee pain    Order Specific Question:   Preferred imaging location?    Answer:   [facility]    Order Specific Question:   Radiology Contrast Protocol - do NOT remove file path    Answer:   [email or web address]   No orders of the defined types were placed in this encounter.   Discussed warning signs or symptoms. Please see discharge instructions. Patient expresses understanding.   The above documentation has been reviewed and is accurate and complete [name], M.D.

## 2019-10-08 NOTE — Patient Instructions (Addendum)
Thank you for coming in today.  Please get an Xray today before you leave  I've referred you to Physical Therapy.  Let us know if you don't hear from them in one week.   Please use voltaren gel up to 4x daily for pain as needed.   Take tylenol before bed as well.   Recheck in about  1 month.  If not better I an do a cortisone shot.   Use the brace as needed.

## 2019-10-09 NOTE — Progress Notes (Signed)
X-ray left knee shows diffuse arthritis but no fractures.

## 2019-10-16 ENCOUNTER — Telehealth: Payer: Self-pay | Admitting: Internal Medicine

## 2019-10-16 NOTE — Telephone Encounter (Signed)
    Patient is hard of hearing, his cousin [name] is assisting him. They are requesting a referral for a home health aide to health with bathing

## 2019-10-16 NOTE — Telephone Encounter (Signed)
Since this was just done I think about 2 wks ago, this does not need to be done again

## 2019-10-21 DIAGNOSIS — I639 Cerebral infarction, unspecified: Secondary | ICD-10-CM | POA: Diagnosis not present

## 2019-10-24 DIAGNOSIS — Z87891 Personal history of nicotine dependence: Secondary | ICD-10-CM | POA: Diagnosis not present

## 2019-10-24 DIAGNOSIS — I69354 Hemiplegia and hemiparesis following cerebral infarction affecting left non-dominant side: Secondary | ICD-10-CM | POA: Diagnosis not present

## 2019-10-24 DIAGNOSIS — H9193 Unspecified hearing loss, bilateral: Secondary | ICD-10-CM | POA: Diagnosis not present

## 2019-10-24 DIAGNOSIS — I2782 Chronic pulmonary embolism: Secondary | ICD-10-CM | POA: Diagnosis not present

## 2019-10-24 DIAGNOSIS — Z9181 History of falling: Secondary | ICD-10-CM | POA: Diagnosis not present

## 2019-10-24 DIAGNOSIS — I10 Essential (primary) hypertension: Secondary | ICD-10-CM | POA: Diagnosis not present

## 2019-10-24 DIAGNOSIS — E785 Hyperlipidemia, unspecified: Secondary | ICD-10-CM | POA: Diagnosis not present

## 2019-10-24 DIAGNOSIS — M1712 Unilateral primary osteoarthritis, left knee: Secondary | ICD-10-CM | POA: Diagnosis not present

## 2019-10-24 DIAGNOSIS — Z7901 Long term (current) use of anticoagulants: Secondary | ICD-10-CM | POA: Diagnosis not present

## 2019-10-25 ENCOUNTER — Telehealth: Payer: Self-pay | Admitting: Internal Medicine

## 2019-10-25 DIAGNOSIS — H9193 Unspecified hearing loss, bilateral: Secondary | ICD-10-CM | POA: Diagnosis not present

## 2019-10-25 DIAGNOSIS — Z9181 History of falling: Secondary | ICD-10-CM | POA: Diagnosis not present

## 2019-10-25 DIAGNOSIS — M1712 Unilateral primary osteoarthritis, left knee: Secondary | ICD-10-CM | POA: Diagnosis not present

## 2019-10-25 DIAGNOSIS — I10 Essential (primary) hypertension: Secondary | ICD-10-CM | POA: Diagnosis not present

## 2019-10-25 DIAGNOSIS — Z7901 Long term (current) use of anticoagulants: Secondary | ICD-10-CM | POA: Diagnosis not present

## 2019-10-25 DIAGNOSIS — E785 Hyperlipidemia, unspecified: Secondary | ICD-10-CM | POA: Diagnosis not present

## 2019-10-25 DIAGNOSIS — I69354 Hemiplegia and hemiparesis following cerebral infarction affecting left non-dominant side: Secondary | ICD-10-CM | POA: Diagnosis not present

## 2019-10-25 DIAGNOSIS — I2782 Chronic pulmonary embolism: Secondary | ICD-10-CM | POA: Diagnosis not present

## 2019-10-25 DIAGNOSIS — Z87891 Personal history of nicotine dependence: Secondary | ICD-10-CM | POA: Diagnosis not present

## 2019-10-25 NOTE — Telephone Encounter (Signed)
   Gracie [facility] requesting verbal order for home PT 1x week 1 2x week 4 1x week 4  Please call Berline Lopes, [phone], ok to leave message

## 2019-10-25 NOTE — Telephone Encounter (Signed)
Verbal orders given via VM 

## 2019-10-26 ENCOUNTER — Telehealth: Payer: Self-pay | Admitting: Internal Medicine

## 2019-10-26 NOTE — Telephone Encounter (Signed)
Jim from [facility] calling for cerbal orders for occupational therapy and a home health aide both for 1 time a week for 8 weeks.  Rosanne Ashing # [phone] Okay to LVM

## 2019-10-27 NOTE — Telephone Encounter (Signed)
Verbal orders given via VM 

## 2019-10-29 DIAGNOSIS — Z7901 Long term (current) use of anticoagulants: Secondary | ICD-10-CM | POA: Diagnosis not present

## 2019-10-29 DIAGNOSIS — Z87891 Personal history of nicotine dependence: Secondary | ICD-10-CM | POA: Diagnosis not present

## 2019-10-29 DIAGNOSIS — H9193 Unspecified hearing loss, bilateral: Secondary | ICD-10-CM | POA: Diagnosis not present

## 2019-10-29 DIAGNOSIS — E785 Hyperlipidemia, unspecified: Secondary | ICD-10-CM | POA: Diagnosis not present

## 2019-10-29 DIAGNOSIS — Z9181 History of falling: Secondary | ICD-10-CM | POA: Diagnosis not present

## 2019-10-29 DIAGNOSIS — I2782 Chronic pulmonary embolism: Secondary | ICD-10-CM | POA: Diagnosis not present

## 2019-10-29 DIAGNOSIS — I69354 Hemiplegia and hemiparesis following cerebral infarction affecting left non-dominant side: Secondary | ICD-10-CM | POA: Diagnosis not present

## 2019-10-29 DIAGNOSIS — I10 Essential (primary) hypertension: Secondary | ICD-10-CM | POA: Diagnosis not present

## 2019-10-29 DIAGNOSIS — M1712 Unilateral primary osteoarthritis, left knee: Secondary | ICD-10-CM | POA: Diagnosis not present

## 2019-10-31 DIAGNOSIS — I10 Essential (primary) hypertension: Secondary | ICD-10-CM | POA: Diagnosis not present

## 2019-10-31 DIAGNOSIS — I2782 Chronic pulmonary embolism: Secondary | ICD-10-CM | POA: Diagnosis not present

## 2019-10-31 DIAGNOSIS — M1712 Unilateral primary osteoarthritis, left knee: Secondary | ICD-10-CM | POA: Diagnosis not present

## 2019-10-31 DIAGNOSIS — H9193 Unspecified hearing loss, bilateral: Secondary | ICD-10-CM | POA: Diagnosis not present

## 2019-10-31 DIAGNOSIS — Z7901 Long term (current) use of anticoagulants: Secondary | ICD-10-CM | POA: Diagnosis not present

## 2019-10-31 DIAGNOSIS — Z9181 History of falling: Secondary | ICD-10-CM | POA: Diagnosis not present

## 2019-10-31 DIAGNOSIS — E785 Hyperlipidemia, unspecified: Secondary | ICD-10-CM | POA: Diagnosis not present

## 2019-10-31 DIAGNOSIS — Z87891 Personal history of nicotine dependence: Secondary | ICD-10-CM | POA: Diagnosis not present

## 2019-10-31 DIAGNOSIS — I69354 Hemiplegia and hemiparesis following cerebral infarction affecting left non-dominant side: Secondary | ICD-10-CM | POA: Diagnosis not present

## 2019-11-02 DIAGNOSIS — H9193 Unspecified hearing loss, bilateral: Secondary | ICD-10-CM | POA: Diagnosis not present

## 2019-11-02 DIAGNOSIS — E785 Hyperlipidemia, unspecified: Secondary | ICD-10-CM | POA: Diagnosis not present

## 2019-11-02 DIAGNOSIS — Z7901 Long term (current) use of anticoagulants: Secondary | ICD-10-CM | POA: Diagnosis not present

## 2019-11-02 DIAGNOSIS — I10 Essential (primary) hypertension: Secondary | ICD-10-CM | POA: Diagnosis not present

## 2019-11-02 DIAGNOSIS — Z87891 Personal history of nicotine dependence: Secondary | ICD-10-CM | POA: Diagnosis not present

## 2019-11-02 DIAGNOSIS — Z9181 History of falling: Secondary | ICD-10-CM | POA: Diagnosis not present

## 2019-11-02 DIAGNOSIS — M1712 Unilateral primary osteoarthritis, left knee: Secondary | ICD-10-CM | POA: Diagnosis not present

## 2019-11-02 DIAGNOSIS — I69354 Hemiplegia and hemiparesis following cerebral infarction affecting left non-dominant side: Secondary | ICD-10-CM | POA: Diagnosis not present

## 2019-11-02 DIAGNOSIS — I2782 Chronic pulmonary embolism: Secondary | ICD-10-CM | POA: Diagnosis not present

## 2019-11-05 DIAGNOSIS — I69354 Hemiplegia and hemiparesis following cerebral infarction affecting left non-dominant side: Secondary | ICD-10-CM | POA: Diagnosis not present

## 2019-11-05 DIAGNOSIS — M1712 Unilateral primary osteoarthritis, left knee: Secondary | ICD-10-CM | POA: Diagnosis not present

## 2019-11-05 DIAGNOSIS — E785 Hyperlipidemia, unspecified: Secondary | ICD-10-CM | POA: Diagnosis not present

## 2019-11-05 DIAGNOSIS — Z9181 History of falling: Secondary | ICD-10-CM | POA: Diagnosis not present

## 2019-11-05 DIAGNOSIS — I10 Essential (primary) hypertension: Secondary | ICD-10-CM | POA: Diagnosis not present

## 2019-11-05 DIAGNOSIS — H9193 Unspecified hearing loss, bilateral: Secondary | ICD-10-CM | POA: Diagnosis not present

## 2019-11-05 DIAGNOSIS — Z7901 Long term (current) use of anticoagulants: Secondary | ICD-10-CM | POA: Diagnosis not present

## 2019-11-05 DIAGNOSIS — I2782 Chronic pulmonary embolism: Secondary | ICD-10-CM | POA: Diagnosis not present

## 2019-11-05 DIAGNOSIS — Z87891 Personal history of nicotine dependence: Secondary | ICD-10-CM | POA: Diagnosis not present

## 2019-11-07 ENCOUNTER — Ambulatory Visit: Payer: Medicare Other | Admitting: Family Medicine

## 2019-11-07 DIAGNOSIS — I10 Essential (primary) hypertension: Secondary | ICD-10-CM | POA: Diagnosis not present

## 2019-11-07 DIAGNOSIS — Z7901 Long term (current) use of anticoagulants: Secondary | ICD-10-CM | POA: Diagnosis not present

## 2019-11-07 DIAGNOSIS — I69354 Hemiplegia and hemiparesis following cerebral infarction affecting left non-dominant side: Secondary | ICD-10-CM | POA: Diagnosis not present

## 2019-11-07 DIAGNOSIS — Z9181 History of falling: Secondary | ICD-10-CM | POA: Diagnosis not present

## 2019-11-07 DIAGNOSIS — E785 Hyperlipidemia, unspecified: Secondary | ICD-10-CM | POA: Diagnosis not present

## 2019-11-07 DIAGNOSIS — H9193 Unspecified hearing loss, bilateral: Secondary | ICD-10-CM | POA: Diagnosis not present

## 2019-11-07 DIAGNOSIS — Z87891 Personal history of nicotine dependence: Secondary | ICD-10-CM | POA: Diagnosis not present

## 2019-11-07 DIAGNOSIS — M1712 Unilateral primary osteoarthritis, left knee: Secondary | ICD-10-CM | POA: Diagnosis not present

## 2019-11-07 DIAGNOSIS — I2782 Chronic pulmonary embolism: Secondary | ICD-10-CM | POA: Diagnosis not present

## 2019-11-08 DIAGNOSIS — I10 Essential (primary) hypertension: Secondary | ICD-10-CM | POA: Diagnosis not present

## 2019-11-08 DIAGNOSIS — I2782 Chronic pulmonary embolism: Secondary | ICD-10-CM | POA: Diagnosis not present

## 2019-11-08 DIAGNOSIS — Z9181 History of falling: Secondary | ICD-10-CM | POA: Diagnosis not present

## 2019-11-08 DIAGNOSIS — E785 Hyperlipidemia, unspecified: Secondary | ICD-10-CM | POA: Diagnosis not present

## 2019-11-08 DIAGNOSIS — I69354 Hemiplegia and hemiparesis following cerebral infarction affecting left non-dominant side: Secondary | ICD-10-CM | POA: Diagnosis not present

## 2019-11-08 DIAGNOSIS — Z7901 Long term (current) use of anticoagulants: Secondary | ICD-10-CM | POA: Diagnosis not present

## 2019-11-08 DIAGNOSIS — Z87891 Personal history of nicotine dependence: Secondary | ICD-10-CM | POA: Diagnosis not present

## 2019-11-08 DIAGNOSIS — M1712 Unilateral primary osteoarthritis, left knee: Secondary | ICD-10-CM | POA: Diagnosis not present

## 2019-11-08 DIAGNOSIS — H9193 Unspecified hearing loss, bilateral: Secondary | ICD-10-CM | POA: Diagnosis not present

## 2019-11-12 DIAGNOSIS — I69354 Hemiplegia and hemiparesis following cerebral infarction affecting left non-dominant side: Secondary | ICD-10-CM | POA: Diagnosis not present

## 2019-11-12 DIAGNOSIS — Z7901 Long term (current) use of anticoagulants: Secondary | ICD-10-CM | POA: Diagnosis not present

## 2019-11-12 DIAGNOSIS — M1712 Unilateral primary osteoarthritis, left knee: Secondary | ICD-10-CM | POA: Diagnosis not present

## 2019-11-12 DIAGNOSIS — Z9181 History of falling: Secondary | ICD-10-CM | POA: Diagnosis not present

## 2019-11-12 DIAGNOSIS — I2782 Chronic pulmonary embolism: Secondary | ICD-10-CM | POA: Diagnosis not present

## 2019-11-12 DIAGNOSIS — H9193 Unspecified hearing loss, bilateral: Secondary | ICD-10-CM | POA: Diagnosis not present

## 2019-11-12 DIAGNOSIS — I10 Essential (primary) hypertension: Secondary | ICD-10-CM | POA: Diagnosis not present

## 2019-11-12 DIAGNOSIS — E785 Hyperlipidemia, unspecified: Secondary | ICD-10-CM | POA: Diagnosis not present

## 2019-11-12 DIAGNOSIS — Z87891 Personal history of nicotine dependence: Secondary | ICD-10-CM | POA: Diagnosis not present

## 2019-11-13 NOTE — Progress Notes (Signed)
   I, [name], LAT, ATC acting as a scribe for [name], MD.  Jeff Lamb is a 86 y.o. male who presents to [facility] today for f/u for L anterior knee pn after suffering a fall in May 2021. Pt was last seen by Dr. Denyse Amass on 10/08/19 and was advised to use Voltaren gel and home health PT as ordered by PCP.  Pt has a hx of stroke causing left-sided weakness in his arm and leg.  Since his last visit, pt reports that his L knee feels about the same.  He has been seeing home health PT twice a week.  Dx imaging: 10/08/19 L knee XR  Pertinent review of systems: No fevers or chills  Relevant historical information: Stroke history left-sided hemiparesis   Exam:  BP (!) 142/80 (BP Location: Right Arm, Patient Position: Sitting, Cuff Size: Normal)   Pulse 62   Ht 5\' 10"  (1.778 m)   Wt 166 lb (75.3 kg)   SpO2 95%   BMI 23.82 kg/m  General: Well Developed, well nourished, and in no acute distress.   MSK: Left knee decreased quad bulk compared to right. Nontender Normal motion.    Lab and Radiology Results Procedure: Real-time Ultrasound Guided Injection of left knee superior lateral patellar space Device: Philips Affiniti 50G Images permanently stored and available for review in PACS Verbal informed consent obtained.  Discussed risks and benefits of procedure. Warned about infection bleeding damage to structures skin hypopigmentation and fat atrophy among others. Patient expresses understanding and agreement Time-out conducted.   Noted no overlying erythema, induration, or other signs of local infection.   Skin prepped in a sterile fashion.   Local anesthesia: Topical Ethyl chloride.   With sterile technique and under real time ultrasound guidance:  40 mg of Kenalog and 2 mL of Marcaine injected into joint. Fluid seen entering the joint capsule.   Completed without difficulty   Pain immediately resolved suggesting accurate placement of the  medication.   Advised to call if fevers/chills, erythema, induration, drainage, or persistent bleeding.   Images permanently stored and available for review in the ultrasound unit.  Impression: Technically successful ultrasound guided injection.      Assessment and Plan: 86 y.o. male with left knee pain due to DJD and weakness.  Improving with home with PT but still having bothersome pain.  Plan for injection and continued home health PT.  Recheck back as needed.   PDMP not reviewed this encounter. Orders Placed This Encounter  Procedures  . 98 LIMITED JOINT SPACE STRUCTURES LOW LEFT(NO LINKED CHARGES)    Order Specific Question:   Reason for Exam (SYMPTOM  OR DIAGNOSIS REQUIRED)    Answer:   L knee pain    Order Specific Question:   Preferred imaging location?    Answer:   [facility]   No orders of the defined types were placed in this encounter.    Discussed warning signs or symptoms. Please see discharge instructions. Patient expresses understanding.   The above documentation has been reviewed and is accurate and complete US, M.D.

## 2019-11-14 ENCOUNTER — Encounter: Payer: Self-pay | Admitting: Family Medicine

## 2019-11-14 ENCOUNTER — Ambulatory Visit: Payer: Self-pay

## 2019-11-14 ENCOUNTER — Other Ambulatory Visit: Payer: Self-pay

## 2019-11-14 ENCOUNTER — Ambulatory Visit: Payer: Medicare Other | Admitting: Family Medicine

## 2019-11-14 VITALS — BP 142/80 | HR 62 | Ht 70.0 in | Wt 166.0 lb

## 2019-11-14 DIAGNOSIS — I2782 Chronic pulmonary embolism: Secondary | ICD-10-CM | POA: Diagnosis not present

## 2019-11-14 DIAGNOSIS — M25562 Pain in left knee: Secondary | ICD-10-CM

## 2019-11-14 DIAGNOSIS — I10 Essential (primary) hypertension: Secondary | ICD-10-CM | POA: Diagnosis not present

## 2019-11-14 DIAGNOSIS — H9193 Unspecified hearing loss, bilateral: Secondary | ICD-10-CM | POA: Diagnosis not present

## 2019-11-14 DIAGNOSIS — Z7901 Long term (current) use of anticoagulants: Secondary | ICD-10-CM | POA: Diagnosis not present

## 2019-11-14 DIAGNOSIS — M1712 Unilateral primary osteoarthritis, left knee: Secondary | ICD-10-CM | POA: Diagnosis not present

## 2019-11-14 DIAGNOSIS — E785 Hyperlipidemia, unspecified: Secondary | ICD-10-CM | POA: Diagnosis not present

## 2019-11-14 DIAGNOSIS — I69354 Hemiplegia and hemiparesis following cerebral infarction affecting left non-dominant side: Secondary | ICD-10-CM | POA: Diagnosis not present

## 2019-11-14 DIAGNOSIS — Z87891 Personal history of nicotine dependence: Secondary | ICD-10-CM | POA: Diagnosis not present

## 2019-11-14 DIAGNOSIS — Z9181 History of falling: Secondary | ICD-10-CM | POA: Diagnosis not present

## 2019-11-14 NOTE — Patient Instructions (Signed)
Thanks for coming in today.   You had a L knee injection Call or go to the ER if you develop a large red swollen joint with extreme pain or oozing puss.   Follow-up as needed.

## 2019-11-15 DIAGNOSIS — Z9181 History of falling: Secondary | ICD-10-CM | POA: Diagnosis not present

## 2019-11-15 DIAGNOSIS — Z7901 Long term (current) use of anticoagulants: Secondary | ICD-10-CM | POA: Diagnosis not present

## 2019-11-15 DIAGNOSIS — I2782 Chronic pulmonary embolism: Secondary | ICD-10-CM | POA: Diagnosis not present

## 2019-11-15 DIAGNOSIS — H9193 Unspecified hearing loss, bilateral: Secondary | ICD-10-CM | POA: Diagnosis not present

## 2019-11-15 DIAGNOSIS — Z87891 Personal history of nicotine dependence: Secondary | ICD-10-CM | POA: Diagnosis not present

## 2019-11-15 DIAGNOSIS — E785 Hyperlipidemia, unspecified: Secondary | ICD-10-CM | POA: Diagnosis not present

## 2019-11-15 DIAGNOSIS — M1712 Unilateral primary osteoarthritis, left knee: Secondary | ICD-10-CM | POA: Diagnosis not present

## 2019-11-15 DIAGNOSIS — I10 Essential (primary) hypertension: Secondary | ICD-10-CM | POA: Diagnosis not present

## 2019-11-15 DIAGNOSIS — I69354 Hemiplegia and hemiparesis following cerebral infarction affecting left non-dominant side: Secondary | ICD-10-CM | POA: Diagnosis not present

## 2019-11-19 ENCOUNTER — Telehealth: Payer: Self-pay | Admitting: Internal Medicine

## 2019-11-19 DIAGNOSIS — Z9181 History of falling: Secondary | ICD-10-CM | POA: Diagnosis not present

## 2019-11-19 DIAGNOSIS — I2782 Chronic pulmonary embolism: Secondary | ICD-10-CM | POA: Diagnosis not present

## 2019-11-19 DIAGNOSIS — I69354 Hemiplegia and hemiparesis following cerebral infarction affecting left non-dominant side: Secondary | ICD-10-CM | POA: Diagnosis not present

## 2019-11-19 DIAGNOSIS — H9193 Unspecified hearing loss, bilateral: Secondary | ICD-10-CM | POA: Diagnosis not present

## 2019-11-19 DIAGNOSIS — E785 Hyperlipidemia, unspecified: Secondary | ICD-10-CM | POA: Diagnosis not present

## 2019-11-19 DIAGNOSIS — Z87891 Personal history of nicotine dependence: Secondary | ICD-10-CM | POA: Diagnosis not present

## 2019-11-19 DIAGNOSIS — M1712 Unilateral primary osteoarthritis, left knee: Secondary | ICD-10-CM | POA: Diagnosis not present

## 2019-11-19 DIAGNOSIS — I10 Essential (primary) hypertension: Secondary | ICD-10-CM | POA: Diagnosis not present

## 2019-11-19 DIAGNOSIS — Z7901 Long term (current) use of anticoagulants: Secondary | ICD-10-CM | POA: Diagnosis not present

## 2019-11-19 NOTE — Telephone Encounter (Signed)
    Rosanne Ashing from [facility] requesting verbal for social work consult  Please call [phone], ok to leave a message

## 2019-11-19 NOTE — Telephone Encounter (Signed)
Sent to [facility] to advise. °

## 2019-11-19 NOTE — Telephone Encounter (Signed)
Okay to give orders as requested;  

## 2019-11-20 NOTE — Telephone Encounter (Signed)
I was able to [facility] for Rosanne Ashing of the verbal ok for a Ecologist.

## 2019-11-21 DIAGNOSIS — H9193 Unspecified hearing loss, bilateral: Secondary | ICD-10-CM | POA: Diagnosis not present

## 2019-11-21 DIAGNOSIS — I639 Cerebral infarction, unspecified: Secondary | ICD-10-CM | POA: Diagnosis not present

## 2019-11-21 DIAGNOSIS — I69354 Hemiplegia and hemiparesis following cerebral infarction affecting left non-dominant side: Secondary | ICD-10-CM | POA: Diagnosis not present

## 2019-11-21 DIAGNOSIS — I2782 Chronic pulmonary embolism: Secondary | ICD-10-CM | POA: Diagnosis not present

## 2019-11-21 DIAGNOSIS — E785 Hyperlipidemia, unspecified: Secondary | ICD-10-CM | POA: Diagnosis not present

## 2019-11-21 DIAGNOSIS — Z7901 Long term (current) use of anticoagulants: Secondary | ICD-10-CM | POA: Diagnosis not present

## 2019-11-21 DIAGNOSIS — I10 Essential (primary) hypertension: Secondary | ICD-10-CM | POA: Diagnosis not present

## 2019-11-21 DIAGNOSIS — Z87891 Personal history of nicotine dependence: Secondary | ICD-10-CM | POA: Diagnosis not present

## 2019-11-21 DIAGNOSIS — M1712 Unilateral primary osteoarthritis, left knee: Secondary | ICD-10-CM | POA: Diagnosis not present

## 2019-11-21 DIAGNOSIS — Z9181 History of falling: Secondary | ICD-10-CM | POA: Diagnosis not present

## 2019-11-22 DIAGNOSIS — Z9181 History of falling: Secondary | ICD-10-CM | POA: Diagnosis not present

## 2019-11-22 DIAGNOSIS — I69354 Hemiplegia and hemiparesis following cerebral infarction affecting left non-dominant side: Secondary | ICD-10-CM | POA: Diagnosis not present

## 2019-11-22 DIAGNOSIS — Z7901 Long term (current) use of anticoagulants: Secondary | ICD-10-CM | POA: Diagnosis not present

## 2019-11-22 DIAGNOSIS — Z87891 Personal history of nicotine dependence: Secondary | ICD-10-CM | POA: Diagnosis not present

## 2019-11-22 DIAGNOSIS — M1712 Unilateral primary osteoarthritis, left knee: Secondary | ICD-10-CM | POA: Diagnosis not present

## 2019-11-22 DIAGNOSIS — H9193 Unspecified hearing loss, bilateral: Secondary | ICD-10-CM | POA: Diagnosis not present

## 2019-11-22 DIAGNOSIS — E785 Hyperlipidemia, unspecified: Secondary | ICD-10-CM | POA: Diagnosis not present

## 2019-11-22 DIAGNOSIS — I2782 Chronic pulmonary embolism: Secondary | ICD-10-CM | POA: Diagnosis not present

## 2019-11-22 DIAGNOSIS — I10 Essential (primary) hypertension: Secondary | ICD-10-CM | POA: Diagnosis not present

## 2019-11-26 DIAGNOSIS — I69354 Hemiplegia and hemiparesis following cerebral infarction affecting left non-dominant side: Secondary | ICD-10-CM | POA: Diagnosis not present

## 2019-11-26 DIAGNOSIS — Z87891 Personal history of nicotine dependence: Secondary | ICD-10-CM | POA: Diagnosis not present

## 2019-11-26 DIAGNOSIS — M1712 Unilateral primary osteoarthritis, left knee: Secondary | ICD-10-CM | POA: Diagnosis not present

## 2019-11-26 DIAGNOSIS — I10 Essential (primary) hypertension: Secondary | ICD-10-CM | POA: Diagnosis not present

## 2019-11-26 DIAGNOSIS — Z7901 Long term (current) use of anticoagulants: Secondary | ICD-10-CM | POA: Diagnosis not present

## 2019-11-26 DIAGNOSIS — H9193 Unspecified hearing loss, bilateral: Secondary | ICD-10-CM | POA: Diagnosis not present

## 2019-11-26 DIAGNOSIS — Z9181 History of falling: Secondary | ICD-10-CM | POA: Diagnosis not present

## 2019-11-26 DIAGNOSIS — I2782 Chronic pulmonary embolism: Secondary | ICD-10-CM | POA: Diagnosis not present

## 2019-11-26 DIAGNOSIS — E785 Hyperlipidemia, unspecified: Secondary | ICD-10-CM | POA: Diagnosis not present

## 2019-11-27 DIAGNOSIS — I69354 Hemiplegia and hemiparesis following cerebral infarction affecting left non-dominant side: Secondary | ICD-10-CM | POA: Diagnosis not present

## 2019-11-27 DIAGNOSIS — Z87891 Personal history of nicotine dependence: Secondary | ICD-10-CM | POA: Diagnosis not present

## 2019-11-27 DIAGNOSIS — H9193 Unspecified hearing loss, bilateral: Secondary | ICD-10-CM | POA: Diagnosis not present

## 2019-11-27 DIAGNOSIS — I2782 Chronic pulmonary embolism: Secondary | ICD-10-CM | POA: Diagnosis not present

## 2019-11-27 DIAGNOSIS — M1712 Unilateral primary osteoarthritis, left knee: Secondary | ICD-10-CM | POA: Diagnosis not present

## 2019-11-27 DIAGNOSIS — E785 Hyperlipidemia, unspecified: Secondary | ICD-10-CM | POA: Diagnosis not present

## 2019-11-27 DIAGNOSIS — Z7901 Long term (current) use of anticoagulants: Secondary | ICD-10-CM | POA: Diagnosis not present

## 2019-11-27 DIAGNOSIS — I10 Essential (primary) hypertension: Secondary | ICD-10-CM | POA: Diagnosis not present

## 2019-11-27 DIAGNOSIS — Z9181 History of falling: Secondary | ICD-10-CM | POA: Diagnosis not present

## 2019-11-28 DIAGNOSIS — I10 Essential (primary) hypertension: Secondary | ICD-10-CM | POA: Diagnosis not present

## 2019-11-28 DIAGNOSIS — H9193 Unspecified hearing loss, bilateral: Secondary | ICD-10-CM | POA: Diagnosis not present

## 2019-11-28 DIAGNOSIS — Z7901 Long term (current) use of anticoagulants: Secondary | ICD-10-CM | POA: Diagnosis not present

## 2019-11-28 DIAGNOSIS — I2782 Chronic pulmonary embolism: Secondary | ICD-10-CM | POA: Diagnosis not present

## 2019-11-28 DIAGNOSIS — Z87891 Personal history of nicotine dependence: Secondary | ICD-10-CM | POA: Diagnosis not present

## 2019-11-28 DIAGNOSIS — I69354 Hemiplegia and hemiparesis following cerebral infarction affecting left non-dominant side: Secondary | ICD-10-CM | POA: Diagnosis not present

## 2019-11-28 DIAGNOSIS — M1712 Unilateral primary osteoarthritis, left knee: Secondary | ICD-10-CM | POA: Diagnosis not present

## 2019-11-28 DIAGNOSIS — Z9181 History of falling: Secondary | ICD-10-CM | POA: Diagnosis not present

## 2019-11-28 DIAGNOSIS — E785 Hyperlipidemia, unspecified: Secondary | ICD-10-CM | POA: Diagnosis not present

## 2019-11-29 DIAGNOSIS — I69354 Hemiplegia and hemiparesis following cerebral infarction affecting left non-dominant side: Secondary | ICD-10-CM | POA: Diagnosis not present

## 2019-11-29 DIAGNOSIS — I10 Essential (primary) hypertension: Secondary | ICD-10-CM | POA: Diagnosis not present

## 2019-11-29 DIAGNOSIS — Z7901 Long term (current) use of anticoagulants: Secondary | ICD-10-CM | POA: Diagnosis not present

## 2019-11-29 DIAGNOSIS — E785 Hyperlipidemia, unspecified: Secondary | ICD-10-CM | POA: Diagnosis not present

## 2019-11-29 DIAGNOSIS — M1712 Unilateral primary osteoarthritis, left knee: Secondary | ICD-10-CM | POA: Diagnosis not present

## 2019-11-29 DIAGNOSIS — H9193 Unspecified hearing loss, bilateral: Secondary | ICD-10-CM | POA: Diagnosis not present

## 2019-11-29 DIAGNOSIS — Z87891 Personal history of nicotine dependence: Secondary | ICD-10-CM | POA: Diagnosis not present

## 2019-11-29 DIAGNOSIS — I2782 Chronic pulmonary embolism: Secondary | ICD-10-CM | POA: Diagnosis not present

## 2019-11-29 DIAGNOSIS — Z9181 History of falling: Secondary | ICD-10-CM | POA: Diagnosis not present

## 2019-11-30 ENCOUNTER — Telehealth: Payer: Self-pay | Admitting: Internal Medicine

## 2019-11-30 NOTE — Telephone Encounter (Signed)
I received a call from a friend of the patient who was in the patient's home with him. She said that he is needing help around the house and simple every day things. She said that he does have social work come out but they only give him a bath on Wednesdays and yesterday when they came they only talked to him. She said he needs someone who can take his trash out, or clean up around the house, or take him places, etc.  Is this something that we can help them with?

## 2019-11-30 NOTE — Telephone Encounter (Signed)
Typically medicare will only pay for home health services. They will not typically pay for an aide. If he has long term care insurance he should contact them. If not he will likely have to pay out of pocket. Otherwise we can coordinate with social worker if they feel he would be served better in a assisted care facility.

## 2019-12-03 DIAGNOSIS — H9193 Unspecified hearing loss, bilateral: Secondary | ICD-10-CM | POA: Diagnosis not present

## 2019-12-03 DIAGNOSIS — Z7901 Long term (current) use of anticoagulants: Secondary | ICD-10-CM | POA: Diagnosis not present

## 2019-12-03 DIAGNOSIS — Z9181 History of falling: Secondary | ICD-10-CM | POA: Diagnosis not present

## 2019-12-03 DIAGNOSIS — I69354 Hemiplegia and hemiparesis following cerebral infarction affecting left non-dominant side: Secondary | ICD-10-CM | POA: Diagnosis not present

## 2019-12-03 DIAGNOSIS — I2782 Chronic pulmonary embolism: Secondary | ICD-10-CM | POA: Diagnosis not present

## 2019-12-03 DIAGNOSIS — E785 Hyperlipidemia, unspecified: Secondary | ICD-10-CM | POA: Diagnosis not present

## 2019-12-03 DIAGNOSIS — M1712 Unilateral primary osteoarthritis, left knee: Secondary | ICD-10-CM | POA: Diagnosis not present

## 2019-12-03 DIAGNOSIS — I10 Essential (primary) hypertension: Secondary | ICD-10-CM | POA: Diagnosis not present

## 2019-12-03 DIAGNOSIS — Z87891 Personal history of nicotine dependence: Secondary | ICD-10-CM | POA: Diagnosis not present

## 2019-12-05 DIAGNOSIS — I69354 Hemiplegia and hemiparesis following cerebral infarction affecting left non-dominant side: Secondary | ICD-10-CM | POA: Diagnosis not present

## 2019-12-05 DIAGNOSIS — M1712 Unilateral primary osteoarthritis, left knee: Secondary | ICD-10-CM | POA: Diagnosis not present

## 2019-12-05 DIAGNOSIS — Z9181 History of falling: Secondary | ICD-10-CM | POA: Diagnosis not present

## 2019-12-05 DIAGNOSIS — Z87891 Personal history of nicotine dependence: Secondary | ICD-10-CM | POA: Diagnosis not present

## 2019-12-05 DIAGNOSIS — I10 Essential (primary) hypertension: Secondary | ICD-10-CM | POA: Diagnosis not present

## 2019-12-05 DIAGNOSIS — E785 Hyperlipidemia, unspecified: Secondary | ICD-10-CM | POA: Diagnosis not present

## 2019-12-05 DIAGNOSIS — I2782 Chronic pulmonary embolism: Secondary | ICD-10-CM | POA: Diagnosis not present

## 2019-12-05 DIAGNOSIS — Z7901 Long term (current) use of anticoagulants: Secondary | ICD-10-CM | POA: Diagnosis not present

## 2019-12-05 DIAGNOSIS — H9193 Unspecified hearing loss, bilateral: Secondary | ICD-10-CM | POA: Diagnosis not present

## 2019-12-10 ENCOUNTER — Other Ambulatory Visit: Payer: Self-pay | Admitting: Internal Medicine

## 2019-12-10 ENCOUNTER — Telehealth: Payer: Self-pay | Admitting: Internal Medicine

## 2019-12-10 DIAGNOSIS — I2782 Chronic pulmonary embolism: Secondary | ICD-10-CM | POA: Diagnosis not present

## 2019-12-10 DIAGNOSIS — H9193 Unspecified hearing loss, bilateral: Secondary | ICD-10-CM | POA: Diagnosis not present

## 2019-12-10 DIAGNOSIS — Z7901 Long term (current) use of anticoagulants: Secondary | ICD-10-CM | POA: Diagnosis not present

## 2019-12-10 DIAGNOSIS — E785 Hyperlipidemia, unspecified: Secondary | ICD-10-CM | POA: Diagnosis not present

## 2019-12-10 DIAGNOSIS — Z87891 Personal history of nicotine dependence: Secondary | ICD-10-CM | POA: Diagnosis not present

## 2019-12-10 DIAGNOSIS — M1712 Unilateral primary osteoarthritis, left knee: Secondary | ICD-10-CM | POA: Diagnosis not present

## 2019-12-10 DIAGNOSIS — I10 Essential (primary) hypertension: Secondary | ICD-10-CM | POA: Diagnosis not present

## 2019-12-10 DIAGNOSIS — Z9181 History of falling: Secondary | ICD-10-CM | POA: Diagnosis not present

## 2019-12-10 DIAGNOSIS — I69354 Hemiplegia and hemiparesis following cerebral infarction affecting left non-dominant side: Secondary | ICD-10-CM | POA: Diagnosis not present

## 2019-12-10 NOTE — Telephone Encounter (Signed)
   Patient wants to know if he should be taking Aspirin

## 2019-12-10 NOTE — Telephone Encounter (Signed)
1.Medication Requested: apixaban (ELIQUIS) 5 MG TABS tablet  2. Pharmacy (Name, Street, [place]):  [id number] [place], [place] - [address] Phone:  [phone]  Fax:  [phone]      3. On Med List: Y  4. Last Visit with PCP:  5.13.21  5. Next visit date with PCP: next appt   Agent: Please be advised that RX refills may take up to 3 business days. We ask that you follow-up with your pharmacy.

## 2019-12-11 MED ORDER — APIXABAN 5 MG PO TABS: 5.0000 mg | ORAL_TABLET | Freq: Two times a day (BID) | ORAL | 3 refills | Status: DC

## 2019-12-11 NOTE — Telephone Encounter (Signed)
Called patient to inform him not to take Asprin just Eliqus but no answer and voicemail was not left.

## 2019-12-11 NOTE — Telephone Encounter (Signed)
No, just the eliquis

## 2019-12-11 NOTE — Addendum Note (Signed)
Addended by: Hillard Danker A on: 12/11/2019 08:39 AM   Modules accepted: Orders

## 2019-12-12 DIAGNOSIS — E785 Hyperlipidemia, unspecified: Secondary | ICD-10-CM | POA: Diagnosis not present

## 2019-12-12 DIAGNOSIS — I69354 Hemiplegia and hemiparesis following cerebral infarction affecting left non-dominant side: Secondary | ICD-10-CM | POA: Diagnosis not present

## 2019-12-12 DIAGNOSIS — I10 Essential (primary) hypertension: Secondary | ICD-10-CM | POA: Diagnosis not present

## 2019-12-12 DIAGNOSIS — Z7901 Long term (current) use of anticoagulants: Secondary | ICD-10-CM | POA: Diagnosis not present

## 2019-12-12 DIAGNOSIS — I2782 Chronic pulmonary embolism: Secondary | ICD-10-CM | POA: Diagnosis not present

## 2019-12-12 DIAGNOSIS — H9193 Unspecified hearing loss, bilateral: Secondary | ICD-10-CM | POA: Diagnosis not present

## 2019-12-12 DIAGNOSIS — M1712 Unilateral primary osteoarthritis, left knee: Secondary | ICD-10-CM | POA: Diagnosis not present

## 2019-12-12 DIAGNOSIS — Z9181 History of falling: Secondary | ICD-10-CM | POA: Diagnosis not present

## 2019-12-12 DIAGNOSIS — Z87891 Personal history of nicotine dependence: Secondary | ICD-10-CM | POA: Diagnosis not present

## 2019-12-17 DIAGNOSIS — I10 Essential (primary) hypertension: Secondary | ICD-10-CM | POA: Diagnosis not present

## 2019-12-17 DIAGNOSIS — M1712 Unilateral primary osteoarthritis, left knee: Secondary | ICD-10-CM | POA: Diagnosis not present

## 2019-12-17 DIAGNOSIS — E785 Hyperlipidemia, unspecified: Secondary | ICD-10-CM | POA: Diagnosis not present

## 2019-12-17 DIAGNOSIS — Z9181 History of falling: Secondary | ICD-10-CM | POA: Diagnosis not present

## 2019-12-17 DIAGNOSIS — Z7901 Long term (current) use of anticoagulants: Secondary | ICD-10-CM | POA: Diagnosis not present

## 2019-12-17 DIAGNOSIS — H9193 Unspecified hearing loss, bilateral: Secondary | ICD-10-CM | POA: Diagnosis not present

## 2019-12-17 DIAGNOSIS — I69354 Hemiplegia and hemiparesis following cerebral infarction affecting left non-dominant side: Secondary | ICD-10-CM | POA: Diagnosis not present

## 2019-12-17 DIAGNOSIS — Z87891 Personal history of nicotine dependence: Secondary | ICD-10-CM | POA: Diagnosis not present

## 2019-12-17 DIAGNOSIS — I2782 Chronic pulmonary embolism: Secondary | ICD-10-CM | POA: Diagnosis not present

## 2019-12-18 DIAGNOSIS — E785 Hyperlipidemia, unspecified: Secondary | ICD-10-CM | POA: Diagnosis not present

## 2019-12-18 DIAGNOSIS — M1712 Unilateral primary osteoarthritis, left knee: Secondary | ICD-10-CM | POA: Diagnosis not present

## 2019-12-18 DIAGNOSIS — I69354 Hemiplegia and hemiparesis following cerebral infarction affecting left non-dominant side: Secondary | ICD-10-CM | POA: Diagnosis not present

## 2019-12-18 DIAGNOSIS — Z87891 Personal history of nicotine dependence: Secondary | ICD-10-CM | POA: Diagnosis not present

## 2019-12-18 DIAGNOSIS — H9193 Unspecified hearing loss, bilateral: Secondary | ICD-10-CM | POA: Diagnosis not present

## 2019-12-18 DIAGNOSIS — Z9181 History of falling: Secondary | ICD-10-CM | POA: Diagnosis not present

## 2019-12-18 DIAGNOSIS — I10 Essential (primary) hypertension: Secondary | ICD-10-CM | POA: Diagnosis not present

## 2019-12-18 DIAGNOSIS — Z7901 Long term (current) use of anticoagulants: Secondary | ICD-10-CM | POA: Diagnosis not present

## 2019-12-18 DIAGNOSIS — I2782 Chronic pulmonary embolism: Secondary | ICD-10-CM | POA: Diagnosis not present

## 2019-12-19 ENCOUNTER — Telehealth: Payer: Self-pay | Admitting: Internal Medicine

## 2019-12-19 NOTE — Telephone Encounter (Signed)
  HH ORDERS   Caller Name: [facility] Agency Name: [facility] [place] Phone #: [phone] Service Requested: PT, SOCIAL WORKER, NURSING AIDE Frequency of Visits:  PT 1W6 AIDE 1W6 SW 1

## 2019-12-20 NOTE — Telephone Encounter (Signed)
Fine

## 2019-12-20 NOTE — Telephone Encounter (Signed)
Verbal orders for below given.

## 2019-12-21 DIAGNOSIS — I639 Cerebral infarction, unspecified: Secondary | ICD-10-CM | POA: Diagnosis not present

## 2019-12-24 DIAGNOSIS — Z7901 Long term (current) use of anticoagulants: Secondary | ICD-10-CM

## 2019-12-24 DIAGNOSIS — Z87891 Personal history of nicotine dependence: Secondary | ICD-10-CM

## 2019-12-24 DIAGNOSIS — I2782 Chronic pulmonary embolism: Secondary | ICD-10-CM

## 2019-12-24 DIAGNOSIS — E785 Hyperlipidemia, unspecified: Secondary | ICD-10-CM | POA: Diagnosis not present

## 2019-12-24 DIAGNOSIS — M1712 Unilateral primary osteoarthritis, left knee: Secondary | ICD-10-CM

## 2019-12-24 DIAGNOSIS — I10 Essential (primary) hypertension: Secondary | ICD-10-CM | POA: Diagnosis not present

## 2019-12-24 DIAGNOSIS — N529 Male erectile dysfunction, unspecified: Secondary | ICD-10-CM

## 2019-12-24 DIAGNOSIS — H9193 Unspecified hearing loss, bilateral: Secondary | ICD-10-CM | POA: Diagnosis not present

## 2019-12-24 DIAGNOSIS — Z9181 History of falling: Secondary | ICD-10-CM

## 2019-12-24 DIAGNOSIS — I69354 Hemiplegia and hemiparesis following cerebral infarction affecting left non-dominant side: Secondary | ICD-10-CM | POA: Diagnosis not present

## 2019-12-25 DIAGNOSIS — I10 Essential (primary) hypertension: Secondary | ICD-10-CM | POA: Diagnosis not present

## 2019-12-25 DIAGNOSIS — Z87891 Personal history of nicotine dependence: Secondary | ICD-10-CM | POA: Diagnosis not present

## 2019-12-25 DIAGNOSIS — M1712 Unilateral primary osteoarthritis, left knee: Secondary | ICD-10-CM | POA: Diagnosis not present

## 2019-12-25 DIAGNOSIS — H9193 Unspecified hearing loss, bilateral: Secondary | ICD-10-CM | POA: Diagnosis not present

## 2019-12-25 DIAGNOSIS — Z7901 Long term (current) use of anticoagulants: Secondary | ICD-10-CM | POA: Diagnosis not present

## 2019-12-25 DIAGNOSIS — I2782 Chronic pulmonary embolism: Secondary | ICD-10-CM | POA: Diagnosis not present

## 2019-12-25 DIAGNOSIS — I69354 Hemiplegia and hemiparesis following cerebral infarction affecting left non-dominant side: Secondary | ICD-10-CM | POA: Diagnosis not present

## 2019-12-25 DIAGNOSIS — Z9181 History of falling: Secondary | ICD-10-CM | POA: Diagnosis not present

## 2019-12-25 DIAGNOSIS — E785 Hyperlipidemia, unspecified: Secondary | ICD-10-CM | POA: Diagnosis not present

## 2019-12-26 DIAGNOSIS — Z9181 History of falling: Secondary | ICD-10-CM | POA: Diagnosis not present

## 2019-12-26 DIAGNOSIS — M1712 Unilateral primary osteoarthritis, left knee: Secondary | ICD-10-CM | POA: Diagnosis not present

## 2019-12-26 DIAGNOSIS — I10 Essential (primary) hypertension: Secondary | ICD-10-CM | POA: Diagnosis not present

## 2019-12-26 DIAGNOSIS — Z7901 Long term (current) use of anticoagulants: Secondary | ICD-10-CM | POA: Diagnosis not present

## 2019-12-26 DIAGNOSIS — I69354 Hemiplegia and hemiparesis following cerebral infarction affecting left non-dominant side: Secondary | ICD-10-CM | POA: Diagnosis not present

## 2019-12-26 DIAGNOSIS — H9193 Unspecified hearing loss, bilateral: Secondary | ICD-10-CM | POA: Diagnosis not present

## 2019-12-26 DIAGNOSIS — I2782 Chronic pulmonary embolism: Secondary | ICD-10-CM | POA: Diagnosis not present

## 2019-12-26 DIAGNOSIS — E785 Hyperlipidemia, unspecified: Secondary | ICD-10-CM | POA: Diagnosis not present

## 2019-12-26 DIAGNOSIS — Z87891 Personal history of nicotine dependence: Secondary | ICD-10-CM | POA: Diagnosis not present

## 2020-01-02 ENCOUNTER — Telehealth: Payer: Self-pay | Admitting: Internal Medicine

## 2020-01-02 NOTE — Progress Notes (Signed)
  Chronic Care Management   Note  01/02/2020 Name: [name] MRN: [id number] DOB: 08/07/1929  Jeff Lamb is a 86 y.o. year old male who is a primary care patient of Jeff Dupre Austin Miles, MD. I reached out to Jeff Lamb by phone today in response to a referral sent by Jeff Lamb's PCP, Jeff Broker, MD.   Jeff Lamb was given information about Chronic Care Management services today including:  1. CCM service includes personalized support from designated clinical staff supervised by his physician, including individualized plan of care and coordination with other care providers 2. 24/7 contact phone numbers for assistance for urgent and routine care needs. 3. Service will only be billed when office clinical staff spend 20 minutes or more in a month to coordinate care. 4. Only one practitioner may furnish and bill the service in a calendar month. 5. The patient may stop CCM services at any time (effective at the end of the month) by phone call to the office staff.   Patient agreed to services and verbal consent obtained.   Follow up plan:   Jeff Lamb [facility] Scheduler

## 2020-01-03 DIAGNOSIS — I10 Essential (primary) hypertension: Secondary | ICD-10-CM | POA: Diagnosis not present

## 2020-01-03 DIAGNOSIS — H9193 Unspecified hearing loss, bilateral: Secondary | ICD-10-CM | POA: Diagnosis not present

## 2020-01-03 DIAGNOSIS — Z87891 Personal history of nicotine dependence: Secondary | ICD-10-CM | POA: Diagnosis not present

## 2020-01-03 DIAGNOSIS — I2782 Chronic pulmonary embolism: Secondary | ICD-10-CM | POA: Diagnosis not present

## 2020-01-03 DIAGNOSIS — Z9181 History of falling: Secondary | ICD-10-CM | POA: Diagnosis not present

## 2020-01-03 DIAGNOSIS — E785 Hyperlipidemia, unspecified: Secondary | ICD-10-CM | POA: Diagnosis not present

## 2020-01-03 DIAGNOSIS — I69354 Hemiplegia and hemiparesis following cerebral infarction affecting left non-dominant side: Secondary | ICD-10-CM | POA: Diagnosis not present

## 2020-01-03 DIAGNOSIS — M1712 Unilateral primary osteoarthritis, left knee: Secondary | ICD-10-CM | POA: Diagnosis not present

## 2020-01-03 DIAGNOSIS — Z7901 Long term (current) use of anticoagulants: Secondary | ICD-10-CM | POA: Diagnosis not present

## 2020-01-07 DIAGNOSIS — H9193 Unspecified hearing loss, bilateral: Secondary | ICD-10-CM | POA: Diagnosis not present

## 2020-01-07 DIAGNOSIS — I2782 Chronic pulmonary embolism: Secondary | ICD-10-CM | POA: Diagnosis not present

## 2020-01-07 DIAGNOSIS — Z7901 Long term (current) use of anticoagulants: Secondary | ICD-10-CM | POA: Diagnosis not present

## 2020-01-07 DIAGNOSIS — M1712 Unilateral primary osteoarthritis, left knee: Secondary | ICD-10-CM | POA: Diagnosis not present

## 2020-01-07 DIAGNOSIS — Z87891 Personal history of nicotine dependence: Secondary | ICD-10-CM | POA: Diagnosis not present

## 2020-01-07 DIAGNOSIS — Z9181 History of falling: Secondary | ICD-10-CM | POA: Diagnosis not present

## 2020-01-07 DIAGNOSIS — E785 Hyperlipidemia, unspecified: Secondary | ICD-10-CM | POA: Diagnosis not present

## 2020-01-07 DIAGNOSIS — I69354 Hemiplegia and hemiparesis following cerebral infarction affecting left non-dominant side: Secondary | ICD-10-CM | POA: Diagnosis not present

## 2020-01-07 DIAGNOSIS — I10 Essential (primary) hypertension: Secondary | ICD-10-CM | POA: Diagnosis not present

## 2020-01-09 DIAGNOSIS — I2782 Chronic pulmonary embolism: Secondary | ICD-10-CM | POA: Diagnosis not present

## 2020-01-09 DIAGNOSIS — I10 Essential (primary) hypertension: Secondary | ICD-10-CM | POA: Diagnosis not present

## 2020-01-09 DIAGNOSIS — M1712 Unilateral primary osteoarthritis, left knee: Secondary | ICD-10-CM | POA: Diagnosis not present

## 2020-01-09 DIAGNOSIS — H9193 Unspecified hearing loss, bilateral: Secondary | ICD-10-CM | POA: Diagnosis not present

## 2020-01-09 DIAGNOSIS — E785 Hyperlipidemia, unspecified: Secondary | ICD-10-CM | POA: Diagnosis not present

## 2020-01-09 DIAGNOSIS — Z9181 History of falling: Secondary | ICD-10-CM | POA: Diagnosis not present

## 2020-01-09 DIAGNOSIS — Z7901 Long term (current) use of anticoagulants: Secondary | ICD-10-CM | POA: Diagnosis not present

## 2020-01-09 DIAGNOSIS — I69354 Hemiplegia and hemiparesis following cerebral infarction affecting left non-dominant side: Secondary | ICD-10-CM | POA: Diagnosis not present

## 2020-01-09 DIAGNOSIS — Z87891 Personal history of nicotine dependence: Secondary | ICD-10-CM | POA: Diagnosis not present

## 2020-01-16 DIAGNOSIS — Z7901 Long term (current) use of anticoagulants: Secondary | ICD-10-CM | POA: Diagnosis not present

## 2020-01-16 DIAGNOSIS — M1712 Unilateral primary osteoarthritis, left knee: Secondary | ICD-10-CM | POA: Diagnosis not present

## 2020-01-16 DIAGNOSIS — Z9181 History of falling: Secondary | ICD-10-CM | POA: Diagnosis not present

## 2020-01-16 DIAGNOSIS — I69354 Hemiplegia and hemiparesis following cerebral infarction affecting left non-dominant side: Secondary | ICD-10-CM | POA: Diagnosis not present

## 2020-01-16 DIAGNOSIS — I10 Essential (primary) hypertension: Secondary | ICD-10-CM | POA: Diagnosis not present

## 2020-01-16 DIAGNOSIS — H9193 Unspecified hearing loss, bilateral: Secondary | ICD-10-CM | POA: Diagnosis not present

## 2020-01-16 DIAGNOSIS — E785 Hyperlipidemia, unspecified: Secondary | ICD-10-CM | POA: Diagnosis not present

## 2020-01-16 DIAGNOSIS — Z87891 Personal history of nicotine dependence: Secondary | ICD-10-CM | POA: Diagnosis not present

## 2020-01-16 DIAGNOSIS — I2782 Chronic pulmonary embolism: Secondary | ICD-10-CM | POA: Diagnosis not present

## 2020-01-21 DIAGNOSIS — I639 Cerebral infarction, unspecified: Secondary | ICD-10-CM | POA: Diagnosis not present

## 2020-01-23 DIAGNOSIS — Z87891 Personal history of nicotine dependence: Secondary | ICD-10-CM | POA: Diagnosis not present

## 2020-01-23 DIAGNOSIS — I69354 Hemiplegia and hemiparesis following cerebral infarction affecting left non-dominant side: Secondary | ICD-10-CM | POA: Diagnosis not present

## 2020-01-23 DIAGNOSIS — Z7901 Long term (current) use of anticoagulants: Secondary | ICD-10-CM | POA: Diagnosis not present

## 2020-01-23 DIAGNOSIS — M1712 Unilateral primary osteoarthritis, left knee: Secondary | ICD-10-CM | POA: Diagnosis not present

## 2020-01-23 DIAGNOSIS — H9193 Unspecified hearing loss, bilateral: Secondary | ICD-10-CM | POA: Diagnosis not present

## 2020-01-23 DIAGNOSIS — E785 Hyperlipidemia, unspecified: Secondary | ICD-10-CM | POA: Diagnosis not present

## 2020-01-23 DIAGNOSIS — I10 Essential (primary) hypertension: Secondary | ICD-10-CM | POA: Diagnosis not present

## 2020-01-23 DIAGNOSIS — I2782 Chronic pulmonary embolism: Secondary | ICD-10-CM | POA: Diagnosis not present

## 2020-01-23 DIAGNOSIS — Z9181 History of falling: Secondary | ICD-10-CM | POA: Diagnosis not present

## 2020-01-30 DIAGNOSIS — I2782 Chronic pulmonary embolism: Secondary | ICD-10-CM | POA: Diagnosis not present

## 2020-01-30 DIAGNOSIS — Z7901 Long term (current) use of anticoagulants: Secondary | ICD-10-CM | POA: Diagnosis not present

## 2020-01-30 DIAGNOSIS — Z87891 Personal history of nicotine dependence: Secondary | ICD-10-CM | POA: Diagnosis not present

## 2020-01-30 DIAGNOSIS — E785 Hyperlipidemia, unspecified: Secondary | ICD-10-CM | POA: Diagnosis not present

## 2020-01-30 DIAGNOSIS — H9193 Unspecified hearing loss, bilateral: Secondary | ICD-10-CM | POA: Diagnosis not present

## 2020-01-30 DIAGNOSIS — I10 Essential (primary) hypertension: Secondary | ICD-10-CM | POA: Diagnosis not present

## 2020-01-30 DIAGNOSIS — Z9181 History of falling: Secondary | ICD-10-CM | POA: Diagnosis not present

## 2020-01-30 DIAGNOSIS — M1712 Unilateral primary osteoarthritis, left knee: Secondary | ICD-10-CM | POA: Diagnosis not present

## 2020-01-30 DIAGNOSIS — I69354 Hemiplegia and hemiparesis following cerebral infarction affecting left non-dominant side: Secondary | ICD-10-CM | POA: Diagnosis not present

## 2020-02-21 DIAGNOSIS — I639 Cerebral infarction, unspecified: Secondary | ICD-10-CM | POA: Diagnosis not present

## 2020-02-25 ENCOUNTER — Telehealth: Payer: Self-pay | Admitting: Pharmacist

## 2020-02-25 NOTE — Progress Notes (Signed)
Chronic Care Management Pharmacy Assistant   Name: Jeff Lamb  MRN: [id number] DOB: 1929-06-22  Reason for Encounter: Initial Questions   PCP : Jeff Broker, MD  Allergies:  No Known Allergies  Medications: Outpatient Encounter Medications as of 02/25/2020  Medication Sig   amLODipine (NORVASC) 2.5 MG tablet Take 1 tablet (2.5 mg total) by mouth daily.   apixaban (ELIQUIS) 5 MG TABS tablet Take 1 tablet (5 mg total) by mouth 2 (two) times daily.   atorvastatin (LIPITOR) 20 MG tablet Take 1 tablet (20 mg total) by mouth daily at 6 PM.   gabapentin (NEURONTIN) 100 MG capsule Take 1 capsule (100 mg total) by mouth at bedtime.   hydrochlorothiazide (MICROZIDE) 12.5 MG capsule Take 1 capsule (12.5 mg total) by mouth daily. Follow-up appt is due in must see provider for future refills   Multiple Vitamin (MULTIVITAMIN WITH MINERALS) TABS tablet Take 1 tablet by mouth every morning.   Naphazoline HCl (CLEAR EYES OP) Place 1 drop into both eyes daily as needed (itching/dry eyes).   potassium chloride (KLOR-CON) 10 MEQ tablet Take 1 tablet (10 mEq total) by mouth daily.   senna-docusate (SENOKOT-S) 8.6-50 MG tablet Take 1 tablet by mouth at bedtime.   No facility-administered encounter medications on file as of 02/25/2020.    Current Diagnosis: Patient Active Problem List   Diagnosis Date Noted   Left knee pain 09/27/2019   Left arm pain 05/24/2019   Pulmonary embolism (HCC) 11/26/2018   Prediabetes 11/26/2018   Right shoulder pain 01/17/2018   Ventral hernia without obstruction or gangrene 03/14/2017   History of stroke 07/12/2013   Hyperlipidemia 07/12/2013   Erectile dysfunction 08/02/2011   Routine health maintenance 12/24/2010   Hypertension     Goals Addressed   None     Follow-Up:  Pharmacist Review    Have you seen any other providers since your last visit? Spoke with patient cousin Jeff Lamb who helps take care of patient states that he  has not any other provider recently  Any changes in your medications or health? She states that the patient has not had any changes in medication or health  Any side effects from any medications? She states that he is not having any side effects from medications  Do you have an symptoms or problems not managed by your medications? She states that the patient does not have any symptoms or problems not managed by medications  Any concerns about your health right now? She states that the patient has not complained about anything with his health  Has your provider asked that you check blood pressure, blood sugar, or follow special diet at home? She states that his blood pressure has not been taken at home and he does not have to check blood sugar  Do you get any type of exercise on a regular basis? She states that the patient will sometimes wheel hisself around in his wheelchair   Can you think of a goal you would like to reach for your health? She states that the patient does not have any health goals  Do you have any problems getting your medications? She states at one time the cost of his eliquis was over a hundred dollars but now is about 35 a month  Is there anything that you would like to discuss during the appointment? She states that there is nothing specific to discuss about the patient. She is expecting a phone on Thursday.  Please bring medications and  supplements to appointment   Jeff Lamb, Clinical Pharmacist Assistant [facility] [phone]

## 2020-02-28 ENCOUNTER — Other Ambulatory Visit: Payer: Self-pay

## 2020-02-28 ENCOUNTER — Ambulatory Visit (INDEPENDENT_AMBULATORY_CARE_PROVIDER_SITE_OTHER): Payer: Medicare Other | Admitting: Pharmacist

## 2020-02-28 ENCOUNTER — Telehealth: Payer: Self-pay | Admitting: Pharmacist

## 2020-02-28 DIAGNOSIS — Z8673 Personal history of transient ischemic attack (TIA), and cerebral infarction without residual deficits: Secondary | ICD-10-CM

## 2020-02-28 DIAGNOSIS — I10 Essential (primary) hypertension: Secondary | ICD-10-CM | POA: Diagnosis not present

## 2020-02-28 DIAGNOSIS — I2782 Chronic pulmonary embolism: Secondary | ICD-10-CM

## 2020-02-28 DIAGNOSIS — E782 Mixed hyperlipidemia: Secondary | ICD-10-CM

## 2020-02-28 NOTE — Patient Instructions (Addendum)
Visit Information  Phone number for Pharmacist: [phone]  Thank you for meeting with me to discuss your medications! I look forward to working with you to achieve your health care goals. Below is a summary of what we talked about during the visit:  Goals Addressed            This Visit's Progress   . Manage My Medicine       Timeframe:  Long-Range Goal Priority:  High Start Date:       02/28/20                      Expected End Date: 08/27/20                      Follow Up Date 06/10/20   - call for medicine refill 2 or 3 days before it runs out - call if I am sick and can't take my medicine - keep a list of all the medicines I take; vitamins and herbals too  -Utilize [facility] for medication synchronization, packaging and delivery   Why is this important?   . These steps will help you keep on track with your medicines.       Patient Care Plan: CCM Pharmacy Care Plan    Problem Identified: Hypertension and Hyperlipidemia, hx stroke, hx PE   Priority: High    Long-Range Goal: Disease management   Start Date: 02/28/2020  Expected End Date: 08/27/2020  This Visit's Progress: On track  Priority: High  Note:   Current Barriers:  . Unable to independently monitor therapeutic efficacy . Unable to self administer medications as prescribed  Pharmacist Clinical Goal(s):  [name]. Over the next 90 days, patient will achieve adherence to monitoring guidelines and medication adherence to achieve therapeutic efficacy . achieve ability to self administer medications as prescribed through use of pill packs as evidenced by patient report through collaboration with PharmD and provider.   Interventions: . 1:1 collaboration with Myrlene Brokerrawford, Elizabeth A, MD regarding development and update of comprehensive plan of care as evidenced by provider attestation and co-signature . Inter-disciplinary care team collaboration (see longitudinal plan of care) . Comprehensive medication review  performed; medication list updated in electronic medical record  Hypertension (BP goal <140/90) -controlled -Current treatment: . Amlodipine 2.5 mg daily  . HCTZ 12.5 mg daily -Denies hypotensive/hypertensive symptoms -Educated on BP goals and benefits of medications for prevention of heart attack, stroke and kidney damage; -Counseled to monitor BP at home as needed, document, and provide log at future appointments -Recommended to continue current medication  Hyperlipidemia: (LDL goal < 70) -Hx stroke [id number], 2020 -controlled; pt complains of pain/aches in L arm and leg that get worse with inactivity - pt has been on statin for years without issue, this pain has been happening for a few months; it is unlikely that these pains are statin-related -Current treatment: . Atorvastatin 10 mg daily -Educated on Cholesterol goals;  Benefits of statin for ASCVD risk reduction; -Recommended to continue current medication  Hx of PE (Goal: prevent subsequent VTE) -controlled  -[facility] 2020[facility] -Current treatment  . Eliquis 5 mg BID -Recommended to continue current medication Assessed patient finances. Eliquis is difficult to afford in donut hole, may consider PAP later in the year  Muscle pain (Goal: manage pain) -uncontrolled - pt reports sudden pain in L arm and leg, usually occurring at night, and upon questioning appears to occur due to inactivity; movement  helps with pain and he reports this was not an issue while he was doing home PT -Current treatment  . Gabapentin 100 mg HS . Tylenol 325 mg PRN -Recommended to continue current medication Recommended to increase activity/movement throughout the day; may consider PT again  Health Maintenance -Vaccine gaps: covid booster -pt is requesting care coordination assistance for help with housekeeping, transportation ([employer]), getting a new wheelchair and new walker w/ wheels/tennis balls;  -Current therapy:   . Multivitamin . Clear Eyes drops . Klor-con 10 mEq daily . Senna-docusate HS -Patient is satisfied with current therapy and denies issues -Recommended to continue current medication Recommended to get Covid booster at local pharmacy Collaborated with care guides for assistance   Patient Goals/Self-Care Activities . Over the next 90 days, patient will:  - take medications as prescribed focus on medication adherence by pill packs collaborate with provider on medication access solutions  Follow Up Plan: Telephone follow up appointment with care management team member scheduled for: 3 months      Mr. Goracke was given information about Chronic Care Management services today including:  1. CCM service includes personalized support from designated clinical staff supervised by his physician, including individualized plan of care and coordination with other care providers 2. 24/7 contact phone numbers for assistance for urgent and routine care needs. 3. Standard insurance, coinsurance, copays and deductibles apply for chronic care management only during months in which we provide at least 20 minutes of these services. Most insurances cover these services at 100%, however patients may be responsible for any copay, coinsurance and/or deductible if applicable. This service may help you avoid the need for more expensive face-to-face services. 4. Only one practitioner may furnish and bill the service in a calendar month. 5. The patient may stop CCM services at any time (effective at the end of the month) by phone call to the office staff.  Patient agreed to services and verbal consent obtained.   The patient verbalized understanding of instructions, educational materials, and care plan provided today and agreed to receive a mailed copy of patient instructions, educational materials, and care plan.  Telephone follow up appointment with pharmacy team member scheduled for: 3 months  Al Corpus, PharmD, BCACP Clinical Pharmacist [facility] [phone]  Health Maintenance After Age 44 After age 15, you are at a higher risk for certain long-term diseases and infections as well as injuries from falls. Falls are a major cause of broken bones and head injuries in people who are older than age 19. Getting regular preventive care can help to keep you healthy and well. Preventive care includes getting regular testing and making lifestyle changes as recommended by your health care provider. Talk with your health care provider about:  Which screenings and tests you should have. A screening is a test that checks for a disease when you have no symptoms.  A diet and exercise plan that is right for you. What should I know about screenings and tests to prevent falls? Screening and testing are the best ways to find a health problem early. Early diagnosis and treatment give you the best chance of managing medical conditions that are common after age 32. Certain conditions and lifestyle choices may make you more likely to have a fall. Your health care provider may recommend:  Regular vision checks. Poor vision and conditions such as cataracts can make you more likely to have a fall. If you wear glasses, make sure to get your  prescription updated if your vision changes.  Medicine review. Work with your health care provider to regularly review all of the medicines you are taking, including over-the-counter medicines. Ask your health care provider about any side effects that may make you more likely to have a fall. Tell your health care provider if any medicines that you take make you feel dizzy or sleepy.  Osteoporosis screening. Osteoporosis is a condition that causes the bones to get weaker. This can make the bones weak and cause them to break more easily.  Blood pressure screening. Blood pressure changes and medicines to control blood pressure can make you feel  dizzy.  Strength and balance checks. Your health care provider may recommend certain tests to check your strength and balance while standing, walking, or changing positions.  Foot health exam. Foot pain and numbness, as well as not wearing proper footwear, can make you more likely to have a fall.  Depression screening. You may be more likely to have a fall if you have a fear of falling, feel emotionally low, or feel unable to do activities that you used to do.  Alcohol use screening. Using too much alcohol can affect your balance and may make you more likely to have a fall. What actions can I take to lower my risk of falls? General instructions  Talk with your health care provider about your risks for falling. Tell your health care provider if: ? You fall. Be sure to tell your health care provider about all falls, even ones that seem minor. ? You feel dizzy, sleepy, or off-balance.  Take over-the-counter and prescription medicines only as told by your health care provider. These include any supplements.  Eat a healthy diet and maintain a healthy weight. A healthy diet includes low-fat dairy products, low-fat (lean) meats, and fiber from whole grains, beans, and lots of fruits and vegetables. Home safety  Remove any tripping hazards, such as rugs, cords, and clutter.  Install safety equipment such as grab bars in bathrooms and safety rails on stairs.  Keep rooms and walkways well-lit. Activity  Follow a regular exercise program to stay fit. This will help you maintain your balance. Ask your health care provider what types of exercise are appropriate for you.  If you need a cane or walker, use it as recommended by your health care provider.  Wear supportive shoes that have nonskid soles.   Lifestyle  Do not drink alcohol if your health care provider tells you not to drink.  If you drink alcohol, limit how much you have: ? 0-1 drink a day for women. ? 0-2 drinks a day for  men.  Be aware of how much alcohol is in your drink. In the [place] one drink equals one typical bottle of beer (12 oz), one-half glass of wine (5 oz), or one shot of hard liquor (1 oz).  Do not use any products that contain nicotine or tobacco, such as cigarettes and e-cigarettes. If you need help quitting, ask your health care provider. Summary  Having a healthy lifestyle and getting preventive care can help to protect your health and wellness after age 2.  Screening and testing are the best way to find a health problem early and help you avoid having a fall. Early diagnosis and treatment give you the best chance for managing medical conditions that are more common for people who are older than age 40.  Falls are a major cause of broken bones and head injuries in people who are  older than age 52. Take precautions to prevent a fall at home.  Work with your health care provider to learn what changes you can make to improve your health and wellness and to prevent falls. This information is not intended to replace advice given to you by your health care provider. Make sure you discuss any questions you have with your health care provider. Document Revised: 04/20/2018 Document Reviewed: 11/10/2016 Elsevier Patient Education  2021 [employer].

## 2020-02-28 NOTE — Telephone Encounter (Signed)
-----   Message from Kathyrn Sheriff, [facility] sent at 02/28/2020  2:52 PM EST ----- Regarding: Onboard patient Please onboard Jeff Lamb. Upload form to [facility] dispensing folder please :)

## 2020-02-28 NOTE — Progress Notes (Signed)
    Chronic Care Management Pharmacy Assistant   Name: Jeff Lamb  MRN: [id number] DOB: 1929/01/17  Reason for Encounter: On Boarding   PCP : Myrlene Broker, MD  Allergies:  No Known Allergies  Medications: Outpatient Encounter Medications as of 02/28/2020  Medication Sig  . amLODipine (NORVASC) 2.5 MG tablet Take 1 tablet (2.5 mg total) by mouth daily.  [name] apixaban (ELIQUIS) 5 MG TABS tablet Take 1 tablet (5 mg total) by mouth 2 (two) times daily.  [name] atorvastatin (LIPITOR) 20 MG tablet Take 1 tablet (20 mg total) by mouth daily at 6 PM.  . escitalopram (LEXAPRO) 10 MG tablet Take 10 mg by mouth daily.  [name] gabapentin (NEURONTIN) 100 MG capsule Take 1 capsule (100 mg total) by mouth at bedtime.  . hydrochlorothiazide (MICROZIDE) 12.5 MG capsule Take 1 capsule (12.5 mg total) by mouth daily. Follow-up appt is due in must see provider for future refills  . Multiple Vitamin (MULTIVITAMIN WITH MINERALS) TABS tablet Take 1 tablet by mouth every morning.  . Naphazoline HCl (CLEAR EYES OP) Place 1 drop into both eyes daily as needed (itching/dry eyes).  . potassium chloride (KLOR-CON) 10 MEQ tablet Take 1 tablet (10 mEq total) by mouth daily.  [name] senna-docusate (SENOKOT-S) 8.6-50 MG tablet Take 1 tablet by mouth at bedtime.   No facility-administered encounter medications on file as of 02/28/2020.    Current Diagnosis: Patient Active Problem List   Diagnosis Date Noted  . Left knee pain 09/27/2019  . Left arm pain 05/24/2019  . Pulmonary embolism (HCC) 11/26/2018  . Prediabetes 11/26/2018  . Right shoulder pain 01/17/2018  . Ventral hernia without obstruction or gangrene 03/14/2017  . History of stroke 07/12/2013  . Hyperlipidemia 07/12/2013  . Erectile dysfunction 08/02/2011  . Routine health maintenance 12/24/2010  . Hypertension     Goals Addressed   None     Follow-Up:  Pharmacist Review     The patient had an initial televisit with Clinical pharmacist Al Corpus on 02/25/2020. Upon completion of the visit the patient has agreed to try [facility] services for their dispensing and delivery of medications. Per clinical pharmacist request I completed an on-boarding form with the list of the patient medications, current pharmacy, demographics, allergies and insurance information. The form was then forward to Clinical pharmacist for review.   Horald Pollen, Clinical Pharmacist Assistant [facility] [phone]    Total time:30

## 2020-02-28 NOTE — Progress Notes (Cosign Needed)
Chronic Care Management Pharmacy Note  02/28/2020 Name:  SAAHIL HERBSTER MRN:  [id number] DOB:  20-Aug-1929  Subjective: Earle Gell is an 87 y.o. year old male who is a primary patient of Hoyt Koch, MD.  The CCM team was consulted for assistance with disease management and care coordination needs.    Engaged with patient by telephone for initial visit in response to provider referral for pharmacy case management and/or care coordination services.   Consent to Services:  The patient was given the following information about Chronic Care Management services today, agreed to services, and gave verbal consent: 1. CCM service includes personalized support from designated clinical staff supervised by the primary care provider, including individualized plan of care and coordination with other care providers 2. 24/7 contact phone numbers for assistance for urgent and routine care needs. 3. Service will only be billed when office clinical staff spend 20 minutes or more in a month to coordinate care. 4. Only one practitioner may furnish and bill the service in a calendar month. 5.The patient may stop CCM services at any time (effective at the end of the month) by phone call to the office staff. 6. The patient will be responsible for cost sharing (co-pay) of up to 20% of the service fee (after annual deductible is met). Patient agreed to services and consent obtained.  Patient Care Team: Hoyt Koch, MD as PCP - General (Internal Medicine) Charlton Haws, [facility] as Pharmacist (Pharmacist)   Patient lives at home alone. His cousin Legrand Rams helps him often, she is accompanying him in the visit today. He is able to cook for himself but it is difficult. He would like someone to help with housekeeping, especially laundry and changing the bedding.  Pt is in a wheelchair. He is able to walk but feels unsteady on his feet. He reports it is not easy to move around his home in a wheelchair  especially over thresholds. He would like to get a new wheelchair since his current one has fabric torn on the arm. He would also like a new walker with wheels /  Tennis balls.  He would like help with applying for [place].   Recent office visits: 09/27/19 Dr Jenny Reichmann OV: c/o L knee pain, referred to sports med. Added amlodipine 2.5 mg for BP. Checking w/ pharmacy to see if Xarelto is less expensive than Eliquis.  Recent consult visits: 11/14/19 Dr Georgina Snell (sports med): L knee pain d/t DJD. Plan for injection and continued home health PT.  Hospital visits: None in previous 6 months  Objective:  Lab Results  Component Value Date   CREATININE 0.98 12/01/2018   BUN 12 12/01/2018   GFR 66.91 01/13/2016   GFRNONAA >60 12/01/2018   GFRAA >60 12/01/2018   NA 142 12/01/2018   K 3.0 (L) 12/01/2018   CALCIUM 6.6 (L) 12/01/2018   CO2 20 (L) 12/01/2018    Lab Results  Component Value Date/Time   HGBA1C 6.4 (H) 11/26/2018 03:53 AM   GFR 66.91 01/13/2016 01:20 PM   GFR 66.62 03/21/2014 11:02 AM    Last diabetic Eye exam: No results found for: HMDIABEYEEXA  Last diabetic Foot exam: No results found for: HMDIABFOOTEX   Lab Results  Component Value Date   CHOL 78 11/26/2018   HDL 46 11/26/2018   LDLCALC 29 11/26/2018   TRIG 16 11/26/2018   CHOLHDL 1.7 11/26/2018    Hepatic Function Latest Ref Rng & Units 12/01/2018 11/25/2018 03/05/2018  Total Protein  6.5 - 8.1 g/dL 4.6(L) 7.9 5.9(L)  Albumin 3.5 - 5.0 g/dL 2.2(L) 4.0 3.1(L)  AST 15 - 41 U/L 21 48(H) 32  ALT 0 - 44 U/L _0 Alk Phosphatase 38 - 126 U/L 41 69 48  Total Bilirubin 0.3 - 1.2 mg/dL 1.5(H) 1.6(H) 1.4(H)    No results found for: TSH, FREET4  CBC Latest Ref Rng & Units 12/01/2018 11/28/2018 11/27/2018  WBC 4.0 - 10.5 K/uL 8.5 9.9 9.5  Hemoglobin 13.0 - 17.0 g/dL 16.4 14.3 14.0  Hematocrit 39.0 - 52.0 % 50.5 43.5 42.6  Platelets 150 - 400 K/uL 214 185 162    No results found for: VD25OH  Clinical ASCVD: Yes  The  ASCVD Risk score Mikey Bussing DC Jr., et al., 2013) failed to calculate for the following reasons:   The 2013 ASCVD risk score is only valid for ages 49 to 36   The patient has a prior MI or stroke diagnosis    Depression screen [facility] 2/9 12/21/2018 03/20/2018 03/16/2018  Decreased Interest 0 0 0  Down, Depressed, Hopeless 0 0 0  PHQ - 2 Score 0 0 0     No flowsheet data found.   Social History   Tobacco Use  Smoking Status Former Smoker  . Packs/day: 5.00  . Years: 20.00  . Pack years: 100.00  . Types: Cigars  . Quit date: 12/22/1980  . Years since quitting: 39.2  Smokeless Tobacco Never Used   BP Readings from Last 3 Encounters:  11/14/19 (!) 142/80  10/08/19 132/80  09/27/19 (!) 160/80   Pulse Readings from Last 3 Encounters:  11/14/19 62  10/08/19 82  09/27/19 60   Wt Readings from Last 3 Encounters:  11/14/19 166 lb (75.3 kg)  10/08/19 166 lb (75.3 kg)  09/27/19 166 lb (75.3 kg)    Assessment/Interventions: Review of patient past medical history, allergies, medications, health status, including review of consultants reports, laboratory and other test data, was performed as part of comprehensive evaluation and provision of chronic care management services.    SDOH:  (Social Determinants of Health) assessments and interventions performed: Yes SDOH Interventions   Flowsheet Row Most Recent Value  SDOH Interventions   Financial Strain Interventions Intervention Not Indicated      CCM Care Plan  No Known Allergies  Medications Reviewed Today    Reviewed by Charlton Haws, [facility] (Pharmacist) on 02/28/20 at [place] List Status: <None>  Medication Order Taking? Sig Documenting Provider Last Dose Status Informant  amLODipine (NORVASC) 2.5 MG tablet [id number] Yes Take 1 tablet (2.5 mg total) by mouth daily. Biagio Borg, MD Taking Active   apixaban (ELIQUIS) 5 MG TABS tablet [id number] Yes Take 1 tablet (5 mg total) by mouth 2 (two) times daily. Hoyt Koch,  MD Taking Active   atorvastatin (LIPITOR) 10 MG tablet [id number] Yes Take 10 mg by mouth daily. [provider] Taking Active   escitalopram (LEXAPRO) 10 MG tablet [id number] Yes Take 10 mg by mouth daily. [provider] Taking Active   gabapentin (NEURONTIN) 100 MG capsule [id number] Yes Take 1 capsule (100 mg total) by mouth at bedtime. Hoyt Koch, MD Taking Active   hydrochlorothiazide (MICROZIDE) 12.5 MG capsule [id number] Yes Take 1 capsule (12.5 mg total) by mouth daily. Follow-up appt is due in must see provider for future refills Hoyt Koch, MD Taking Active   Multiple Vitamin (MULTIVITAMIN WITH MINERALS) TABS tablet [id number] Yes Take 1 tablet by mouth every  morning. [provider] Taking Active Self  Naphazoline HCl (CLEAR EYES OP) [id number] Yes Place 1 drop into both eyes daily as needed (itching/dry eyes). [provider] Taking Active Self  potassium chloride (KLOR-CON) 10 MEQ tablet [id number] Yes Take 1 tablet (10 mEq total) by mouth daily. Hoyt Koch, MD Taking Active   senna-docusate (SENOKOT-S) 8.6-50 MG tablet [id number] Yes Take 1 tablet by mouth at bedtime. Hoyt Koch, MD Taking Active           Patient Active Problem List   Diagnosis Date Noted  . Left knee pain 09/27/2019  . Left arm pain 05/24/2019  . Pulmonary embolism ([place]) 11/26/2018  . Prediabetes 11/26/2018  . Right shoulder pain 01/17/2018  . Ventral hernia without obstruction or gangrene 03/14/2017  . History of stroke 07/12/2013  . Hyperlipidemia 07/12/2013  . Erectile dysfunction 08/02/2011  . Routine health maintenance 12/24/2010  . Hypertension     Immunization History  Administered Date(s) Administered  . DTaP 01/12/2008  . Influenza Split 10/12/2010  . Influenza, High Dose Seasonal PF 03/14/2017, 01/17/2018, 10/29/2018  . Influenza,inj,Quad PF,6+ Mos 10/16/2013, 10/13/2015  . Moderna Sars-Covid-2 Vaccination 01/08/2019,  02/05/2019  . Pneumococcal Conjugate-13 02/12/2008  . Pneumococcal Polysaccharide-23 03/21/2014  . Tdap 09/24/2016    Conditions to be addressed/monitored:  Hypertension and Hyperlipidemia, hx stroke, hx PE  Care Plan : [place]  Updates made by Charlton Haws, [place] since 02/28/2020 12:00 AM    Problem: Hypertension and Hyperlipidemia, hx stroke, hx PE   Priority: High    Long-Range Goal: Disease management   Start Date: 02/28/2020  Expected End Date: 08/27/2020  This Visit's Progress: On track  Priority: High  Note:   Current Barriers:  . Unable to independently monitor therapeutic efficacy . Unable to self administer medications as prescribed  Pharmacist Clinical Goal(s):  [name] Over the next 90 days, patient will achieve adherence to monitoring guidelines and medication adherence to achieve therapeutic efficacy . achieve ability to self administer medications as prescribed through use of pill packs as evidenced by patient report through collaboration with PharmD and provider.   Interventions: . 1:1 collaboration with Hoyt Koch, MD regarding development and update of comprehensive plan of care as evidenced by provider attestation and co-signature . Inter-disciplinary care team collaboration (see longitudinal plan of care) . Comprehensive medication review performed; medication list updated in electronic medical record  Hypertension (BP goal <140/90) -controlled -Current treatment: . Amlodipine 2.5 mg daily  . HCTZ 12.5 mg daily -Denies hypotensive/hypertensive symptoms -Educated on BP goals and benefits of medications for prevention of heart attack, stroke and kidney damage; -Counseled to monitor BP at home as needed, document, and provide log at future appointments -Recommended to continue current medication  Hyperlipidemia: (LDL goal < 70) -Hx stroke 2015, 2020 -controlled; pt complains of pain/aches in L arm and leg that get worse with  inactivity - pt has been on statin for years without issue, this pain has been happening for a few months; it is unlikely that these pains are statin-related -Current treatment: . Atorvastatin 10 mg daily -Educated on Cholesterol goals;  Benefits of statin for ASCVD risk reduction; -Recommended to continue current medication  Hx of PE (Goal: prevent subsequent VTE) -controlled  -PE/stroke Nov 2020, [facility] from [facility]. -Current treatment  . Eliquis 5 mg BID -Recommended to continue current medication Assessed patient finances. Eliquis is difficult to afford in [place], may consider PAP later in the year  Muscle pain (  Goal: manage pain) -uncontrolled - pt reports sudden pain in L arm and leg, usually occurring at night, and upon questioning appears to occur due to inactivity; movement helps with pain and he reports this was not an issue while he was doing home PT -Current treatment  . Gabapentin 100 mg HS . Tylenol 325 mg PRN -Recommended to continue current medication Recommended to increase activity/movement throughout the day; may consider PT again  Health Maintenance -Vaccine gaps: covid booster -pt is requesting care coordination assistance for help with housekeeping, transportation ([place]), getting a new wheelchair and new walker w/ wheels/tennis balls;  -Current therapy:  . Multivitamin . Clear Eyes drops . Klor-con 10 mEq daily . Senna-docusate HS -Patient is satisfied with current therapy and denies issues -Recommended to continue current medication Recommended to get Covid booster at local pharmacy Collaborated with care guides for assistance   Patient Goals/Self-Care Activities . Over the next 90 days, patient will:  - take medications as prescribed focus on medication adherence by pill packs collaborate with provider on medication access solutions  Follow Up Plan: Telephone follow up appointment with care management team member scheduled for: 3 months       Medication Assistance: None required.  Patient affirms current coverage meets needs.  Patient's preferred pharmacy is:  [facility] - [place], [place] - [address] [place] [address] Phone: [phone] Fax: [phone]  Uses pill box? Yes Pt endorses 80% compliance  We discussed: Verbal consent obtained for [facility] enhanced pharmacy services (medication synchronization, adherence packaging, delivery coordination). A medication sync plan was created to allow patient to get all medications delivered once every 30 to 90 days per patient preference. Patient understands they have freedom to choose pharmacy and clinical pharmacist will coordinate care between all prescribers and [facility].  Patient decided to: Utilize [facility] for medication synchronization, packaging and delivery  Care Plan and Follow Up Patient Decision:  Patient agrees to Care Plan and Follow-up.  Plan: Telephone follow up appointment with care management team member scheduled for:  3 months  Charlene Brooke, PharmD, [facility] Clinical Pharmacist [facility] [phone]

## 2020-02-29 NOTE — Progress Notes (Signed)
I was out of office on this date please route to another provider for sign off

## 2020-03-03 ENCOUNTER — Other Ambulatory Visit: Payer: Self-pay | Admitting: *Deleted

## 2020-03-03 DIAGNOSIS — I10 Essential (primary) hypertension: Secondary | ICD-10-CM

## 2020-03-03 DIAGNOSIS — Z0000 Encounter for general adult medical examination without abnormal findings: Secondary | ICD-10-CM

## 2020-03-07 ENCOUNTER — Telehealth: Payer: Self-pay | Admitting: Internal Medicine

## 2020-03-07 NOTE — Telephone Encounter (Signed)
   Telephone encounter was:  Unsuccessful.  03/07/2020 Name: Jeff Lamb MRN: [id number] DOB: 1929-12-22  Unsuccessful outbound call made today to assist with:  needs medical equipment  Outreach Attempt:  1st Attempt  A HIPAA compliant voice message was left requesting a return call.  Instructed patient to call back at [phone].  [name] Care Guide, Embedded Care Coordination [facility], Care Management Phone: [phone] Email: [email or web address]

## 2020-03-17 ENCOUNTER — Telehealth: Payer: Self-pay | Admitting: Internal Medicine

## 2020-03-17 NOTE — Telephone Encounter (Signed)
   Telephone encounter was:  Unsuccessful.  03/17/2020 Name: Jeff Lamb MRN: [id number] DOB: Jun 06, 1929  Unsuccessful outbound call made today to assist with:  Transportation Needs  and housekeeping and medical equipment.   Outreach Attempt:  2nd Attempt  A HIPAA compliant voice message was left requesting a return call.  Instructed patient to call back at [phone].  [name] Care Guide, Embedded Care Coordination [facility], Care Management Phone: [phone] Email: [email or web address]

## 2020-03-20 DIAGNOSIS — I639 Cerebral infarction, unspecified: Secondary | ICD-10-CM | POA: Diagnosis not present

## 2020-03-24 ENCOUNTER — Telehealth: Payer: Self-pay | Admitting: Pharmacist

## 2020-03-24 ENCOUNTER — Telehealth: Payer: Self-pay | Admitting: Internal Medicine

## 2020-03-24 NOTE — Telephone Encounter (Signed)
   Telephone encounter was:  Successful.  03/24/2020 Name: Jeff Lamb MRN: [id number] DOB: Oct 05, 1929  [name] is a 87 y.o. year old male who is a primary care patient of Okey Dupre Austin Miles, MD . 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   Care guide performed the following interventions: Patient provided with information about care guide support team and interviewed to confirm resource needs Discussed resources to assist with in home care, [name] benefits and new equipment like a wheelchair. I spoke to his cousin because patient is hard of hearing on the phone. I am going to email her a list of transportation options, home health agencies (they like to switch from [facility] since they keep dropping him, currently he is not in home health), patient is currently on MOW.  Obtained verbal consent to place patient referral to [facility]. I am waiting for pt to call me back so that I can find out a little more around his time of service so that I can make a good referral. .  Follow Up Plan:  Care guide will follow up with patient by phone over the next week.  and Client will his cousin will text pt and give him my number. When he calls I can get the other information that I need to complete the referral to [facility].   [name] Care Guide, Embedded Care Coordination [facility], Care Management Phone: [phone] Email: [email or web address]

## 2020-03-25 ENCOUNTER — Telehealth: Payer: Self-pay | Admitting: Internal Medicine

## 2020-03-25 NOTE — Telephone Encounter (Signed)
   Telephone encounter was:  Successful.  03/25/2020 Name: Jeff Lamb MRN: [id number] DOB: 1929-08-23  Jeff Lamb is a 87 y.o. year old male who is a primary care patient of Okey Dupre Austin Miles, MD . The community resource team was consulted for assistance with getting him VA benefits to help make his life easier.   Care guide performed the following interventions: Patient provided with information about care guide support team and interviewed to confirm resource needs Obtained verbal consent to place patient referral to [facility] Placed referral to [facility] via email. Patient gave me verbal permission to put his cousin Jeff Lamb on the referral form because he said that he can't hear very well. .  Follow Up Plan:  Care guide will follow up with patient by phone over the next week and No further follow up planned at this time. The patient has been provided with needed resources.  [name] Care Guide, Embedded Care Coordination [facility], Care Management Phone: [phone] Email: [email or web address]

## 2020-03-31 ENCOUNTER — Ambulatory Visit (INDEPENDENT_AMBULATORY_CARE_PROVIDER_SITE_OTHER): Payer: Medicare Other | Admitting: Internal Medicine

## 2020-03-31 ENCOUNTER — Other Ambulatory Visit: Payer: Self-pay

## 2020-03-31 ENCOUNTER — Encounter: Payer: Self-pay | Admitting: Internal Medicine

## 2020-03-31 VITALS — BP 132/80 | HR 87 | Temp 98.4°F | Resp 18 | Ht 70.0 in | Wt 160.4 lb

## 2020-03-31 DIAGNOSIS — H9193 Unspecified hearing loss, bilateral: Secondary | ICD-10-CM

## 2020-03-31 DIAGNOSIS — I10 Essential (primary) hypertension: Secondary | ICD-10-CM | POA: Diagnosis not present

## 2020-03-31 DIAGNOSIS — R7303 Prediabetes: Secondary | ICD-10-CM | POA: Diagnosis not present

## 2020-03-31 DIAGNOSIS — E782 Mixed hyperlipidemia: Secondary | ICD-10-CM | POA: Diagnosis not present

## 2020-03-31 DIAGNOSIS — Z8673 Personal history of transient ischemic attack (TIA), and cerebral infarction without residual deficits: Secondary | ICD-10-CM

## 2020-03-31 LAB — LIPID PANEL
Cholesterol: 110 mg/dL (ref 0–200)
HDL: 65.1 mg/dL (ref >39.00)
LDL Cholesterol: 36 mg/dL (ref 0–99)
NonHDL: 44.4
Total CHOL/HDL Ratio: 2
Triglycerides: 40 mg/dL (ref 0.0–149.0)
VLDL: 8 mg/dL (ref 0.0–40.0)

## 2020-03-31 LAB — HEMOGLOBIN A1C: Hgb A1c MFr Bld: 6.1 % (ref 4.6–6.5)

## 2020-03-31 LAB — COMPREHENSIVE METABOLIC PANEL
ALT: 21 U/L (ref 0–53)
AST: 21 U/L (ref 0–37)
Albumin: 4.1 g/dL (ref 3.5–5.2)
Alkaline Phosphatase: 48 U/L (ref 39–117)
BUN: 18 mg/dL (ref 6–23)
CO2: 34 mEq/L — ABNORMAL HIGH (ref 19–32)
Calcium: 9.6 mg/dL (ref 8.4–10.5)
Chloride: 100 mEq/L (ref 96–112)
Creatinine, Ser: 1.16 mg/dL (ref 0.40–1.50)
GFR: 56.04 mL/min — ABNORMAL LOW (ref >60.00)
Glucose, Bld: 79 mg/dL (ref 70–99)
Potassium: 4.1 mEq/L (ref 3.5–5.1)
Sodium: 139 mEq/L (ref 135–145)
Total Bilirubin: 1 mg/dL (ref 0.2–1.2)
Total Protein: 7 g/dL (ref 6.0–8.3)

## 2020-03-31 LAB — CBC
HCT: 42.5 % (ref 39.0–52.0)
Hemoglobin: 13.9 g/dL (ref 13.0–17.0)
MCHC: 32.7 g/dL (ref 30.0–36.0)
MCV: 95.8 fl (ref 78.0–100.0)
Platelets: 180 10*3/uL (ref 150.0–400.0)
RBC: 4.43 Mil/uL (ref 4.22–5.81)
RDW: 12.9 % (ref 11.5–15.5)
WBC: 5.8 10*3/uL (ref 4.0–10.5)

## 2020-03-31 NOTE — Progress Notes (Unsigned)
   Subjective:   Patient ID: Jeff Lamb, male    DOB: Jul 29, 1929, 87 y.o.   MRN: [id number]  HPI The patient is an 87 YO man coming in for concerns of worsening weakness. Prior stroke and he is worried about falling. He is going through [place] to help with someone to help with housework. He does feel that he had good benefit with PT at home in the past and would like to try that again. Family helps him but does not live with him currently. Uses wheelchair at home and can transfer, sometimes walk short distances, has walker and cane as well  Review of Systems  Constitutional: Positive for activity change and fatigue. Negative for appetite change and unexpected weight change.  HENT: Negative.   Eyes: Negative.   Respiratory: Negative for cough, chest tightness and shortness of breath.   Cardiovascular: Negative for chest pain, palpitations and leg swelling.  Gastrointestinal: Negative for abdominal distention, abdominal pain, constipation, diarrhea, nausea and vomiting.  Musculoskeletal: Positive for arthralgias.  Skin: Negative.   Neurological: Positive for weakness.  Psychiatric/Behavioral: Negative.     Objective:  Physical Exam Constitutional:      Appearance: He is well-developed.  HENT:     Head: Normocephalic and atraumatic.  Cardiovascular:     Rate and Rhythm: Normal rate and regular rhythm.  Pulmonary:     Effort: Pulmonary effort is normal. No respiratory distress.     Breath sounds: Normal breath sounds. No wheezing or rales.  Abdominal:     General: Bowel sounds are normal. There is no distension.     Palpations: Abdomen is soft.     Tenderness: There is no abdominal tenderness. There is no rebound.  Musculoskeletal:     Cervical back: Normal range of motion.  Skin:    General: Skin is warm and dry.  Neurological:     Mental Status: He is alert and oriented to person, place, and time. Mental status is at baseline.     Motor: Weakness present.     Coordination:  Coordination abnormal.     Comments: 3/4 strength bilateral LE, in wheelchair for visit     Vitals:   03/31/20 1421  BP: 132/80  Pulse: 87  Resp: 18  Temp: 98.4 F (36.9 C)  TempSrc: Oral  SpO2: 97%  Weight: 160 lb 6.4 oz (72.8 kg)  Height: 5\' 10"  (1.778 m)    This visit occurred during the SARS-CoV-2 public health emergency.  Safety protocols were in place, including screening questions prior to the visit, additional usage of staff PPE, and extensive cleaning of exam room while observing appropriate contact time as indicated for disinfecting solutions.   Assessment & Plan:  Visit time 25 minutes in face to face communication with patient and coordination of care, additional 10 minutes spent in record review, coordination or care, ordering tests, communicating/referring to other healthcare professionals, documenting in medical records all on the same day of the visit for total time 35 minutes spent on the visit.

## 2020-03-31 NOTE — Patient Instructions (Signed)

## 2020-04-02 NOTE — Assessment & Plan Note (Signed)
BP at goal checking CMP and adjust hctz and amlodipine and potassium as needed.

## 2020-04-02 NOTE — Assessment & Plan Note (Signed)
Checking lipid panel and adjust lipitor 10 mg daily as needed.  

## 2020-04-02 NOTE — Assessment & Plan Note (Signed)
Checking HgA1c and adjust as needed.  

## 2020-04-02 NOTE — Assessment & Plan Note (Signed)
Would benefit from repeated PT/OT through home health at this time to help keep him in his own home. They are working through [place] benefits also at this time to get more long term support.

## 2020-04-07 DIAGNOSIS — D509 Iron deficiency anemia, unspecified: Secondary | ICD-10-CM | POA: Diagnosis not present

## 2020-04-07 DIAGNOSIS — I69354 Hemiplegia and hemiparesis following cerebral infarction affecting left non-dominant side: Secondary | ICD-10-CM | POA: Diagnosis not present

## 2020-04-07 DIAGNOSIS — M170 Bilateral primary osteoarthritis of knee: Secondary | ICD-10-CM | POA: Diagnosis not present

## 2020-04-07 DIAGNOSIS — I10 Essential (primary) hypertension: Secondary | ICD-10-CM | POA: Diagnosis not present

## 2020-04-09 DIAGNOSIS — M170 Bilateral primary osteoarthritis of knee: Secondary | ICD-10-CM | POA: Diagnosis not present

## 2020-04-09 DIAGNOSIS — I69354 Hemiplegia and hemiparesis following cerebral infarction affecting left non-dominant side: Secondary | ICD-10-CM | POA: Diagnosis not present

## 2020-04-09 DIAGNOSIS — I10 Essential (primary) hypertension: Secondary | ICD-10-CM | POA: Diagnosis not present

## 2020-04-09 DIAGNOSIS — D509 Iron deficiency anemia, unspecified: Secondary | ICD-10-CM | POA: Diagnosis not present

## 2020-04-10 ENCOUNTER — Telehealth: Payer: Self-pay | Admitting: Internal Medicine

## 2020-04-10 DIAGNOSIS — D509 Iron deficiency anemia, unspecified: Secondary | ICD-10-CM | POA: Diagnosis not present

## 2020-04-10 DIAGNOSIS — I69354 Hemiplegia and hemiparesis following cerebral infarction affecting left non-dominant side: Secondary | ICD-10-CM | POA: Diagnosis not present

## 2020-04-10 DIAGNOSIS — I10 Essential (primary) hypertension: Secondary | ICD-10-CM | POA: Diagnosis not present

## 2020-04-10 DIAGNOSIS — M170 Bilateral primary osteoarthritis of knee: Secondary | ICD-10-CM | POA: Diagnosis not present

## 2020-04-10 NOTE — Progress Notes (Signed)
error 

## 2020-04-10 NOTE — Telephone Encounter (Signed)
Jeff Lamb from [place] occupational therapist   PT was seen 3.30.22 and he is requesting verbal orders for 2 more visits for training and ADL (bathing and transfer) and exercise   [phone]- Roe Coombs, okay to leave a message

## 2020-04-11 DIAGNOSIS — D509 Iron deficiency anemia, unspecified: Secondary | ICD-10-CM | POA: Diagnosis not present

## 2020-04-11 DIAGNOSIS — M170 Bilateral primary osteoarthritis of knee: Secondary | ICD-10-CM | POA: Diagnosis not present

## 2020-04-11 DIAGNOSIS — I10 Essential (primary) hypertension: Secondary | ICD-10-CM | POA: Diagnosis not present

## 2020-04-11 DIAGNOSIS — I69354 Hemiplegia and hemiparesis following cerebral infarction affecting left non-dominant side: Secondary | ICD-10-CM | POA: Diagnosis not present

## 2020-04-11 NOTE — Telephone Encounter (Signed)
Spoke with Jeff Lamb and gave him the verbal ok per Dr. Okey Dupre. No other questions or concerns at this time.

## 2020-04-11 NOTE — Telephone Encounter (Signed)
Fine

## 2020-04-11 NOTE — Telephone Encounter (Signed)
See below

## 2020-04-14 DIAGNOSIS — M170 Bilateral primary osteoarthritis of knee: Secondary | ICD-10-CM | POA: Diagnosis not present

## 2020-04-14 DIAGNOSIS — I10 Essential (primary) hypertension: Secondary | ICD-10-CM | POA: Diagnosis not present

## 2020-04-14 DIAGNOSIS — I69354 Hemiplegia and hemiparesis following cerebral infarction affecting left non-dominant side: Secondary | ICD-10-CM | POA: Diagnosis not present

## 2020-04-14 DIAGNOSIS — D509 Iron deficiency anemia, unspecified: Secondary | ICD-10-CM | POA: Diagnosis not present

## 2020-04-16 DIAGNOSIS — I69354 Hemiplegia and hemiparesis following cerebral infarction affecting left non-dominant side: Secondary | ICD-10-CM | POA: Diagnosis not present

## 2020-04-16 DIAGNOSIS — I10 Essential (primary) hypertension: Secondary | ICD-10-CM | POA: Diagnosis not present

## 2020-04-16 DIAGNOSIS — D509 Iron deficiency anemia, unspecified: Secondary | ICD-10-CM | POA: Diagnosis not present

## 2020-04-16 DIAGNOSIS — M170 Bilateral primary osteoarthritis of knee: Secondary | ICD-10-CM | POA: Diagnosis not present

## 2020-04-17 NOTE — Telephone Encounter (Addendum)
   Family not happy with Roe Coombs from [place], advised to contact [place] with complaints. The patients cousin [name] reached out to [place] and was told to ask for new order for bathing and transfer assistance.

## 2020-04-20 DIAGNOSIS — I639 Cerebral infarction, unspecified: Secondary | ICD-10-CM | POA: Diagnosis not present

## 2020-04-21 ENCOUNTER — Telehealth: Payer: Self-pay | Admitting: Internal Medicine

## 2020-04-21 DIAGNOSIS — M170 Bilateral primary osteoarthritis of knee: Secondary | ICD-10-CM | POA: Diagnosis not present

## 2020-04-21 DIAGNOSIS — I10 Essential (primary) hypertension: Secondary | ICD-10-CM | POA: Diagnosis not present

## 2020-04-21 DIAGNOSIS — D509 Iron deficiency anemia, unspecified: Secondary | ICD-10-CM | POA: Diagnosis not present

## 2020-04-21 DIAGNOSIS — I69354 Hemiplegia and hemiparesis following cerebral infarction affecting left non-dominant side: Secondary | ICD-10-CM | POA: Diagnosis not present

## 2020-04-21 NOTE — Telephone Encounter (Signed)
See below

## 2020-04-21 NOTE — Telephone Encounter (Signed)
Bahvin w/ [facility] called and wanted to inform PCP that the patients BP was 97/49. He said that the patient rolled his wheelchair to the mail box and back. He also said that the patient has not eaten. He advised him to go to the ED. Please advise    Phone: [phone]

## 2020-04-21 NOTE — Telephone Encounter (Signed)
Did he have any symptoms with low BP such as dizziness or feeling like he might pass out? If so go to urgent care. If not can be seen in office within a few days.

## 2020-04-22 ENCOUNTER — Ambulatory Visit (INDEPENDENT_AMBULATORY_CARE_PROVIDER_SITE_OTHER): Payer: Medicare Other | Admitting: Otolaryngology

## 2020-04-22 ENCOUNTER — Other Ambulatory Visit: Payer: Self-pay

## 2020-04-22 VITALS — Temp 97.2°F

## 2020-04-22 DIAGNOSIS — H903 Sensorineural hearing loss, bilateral: Secondary | ICD-10-CM | POA: Diagnosis not present

## 2020-04-22 DIAGNOSIS — H6123 Impacted cerumen, bilateral: Secondary | ICD-10-CM | POA: Diagnosis not present

## 2020-04-22 NOTE — Progress Notes (Signed)
HPI: Jeff Lamb is a 87 y.o. male who presents is referred by his PCP Dr. Okey Dupre for evaluation of hearing loss.  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  He is also had history of wax buildup in his ears.  He has had previous hearing aids but has not had his hearing checked in several years.  He is not wearing his hearing aids today..  Past Medical History:  Diagnosis Date  . Biceps tendonitis 12/24/2010  . 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 Hypertension    Past Surgical History:  Procedure Laterality Date  . APPENDECTOMY     31  . CATARACT EXTRACTION W/ INTRAOCULAR LENS IMPLANT  October '12   left.   Social History   Socioeconomic History  . Marital status: Single    Spouse name: Not on file  . Number of children: 1  . Years of education: 50  . Highest education level: Not on file  Occupational History  . 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 Smoking status: Former Smoker    Packs/day: 5.00    Years: 20.00    Pack years: 100.00    Types: Cigars    Quit date: 12/22/1980    Years since quitting: 39.3  . Smokeless tobacco: Never Used  Vaping Use  . Vaping Use: Never used  Substance and Sexual Activity  . Alcohol use: Yes    Comment: ,moderate use  . Drug use: No  . Sexual activity: Not on file  Other Topics Concern  . 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 Married ''24 - 1 year /divorced. 1 son '62. 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 Lives alone. Has cousins in the area. Son is principle contact: [name] (c) [phone].               Social Determinants of Health   Financial Resource Strain: Low Risk   . Difficulty of Paying Living Expenses: Not very hard  Food Insecurity: Not on file  Transportation Needs: Not on file  Physical  Activity: Not on file  Stress: Not on file  Social Connections: Not on file   Family History  Problem Relation Age of Onset  . Stroke Mother   . Hypertension Mother   . Cancer Maternal Uncle        throat cancer  . Cancer Maternal Uncle        throat cancer  . Diabetes Neg Hx   . Heart disease Neg Hx    No Known Allergies Prior to Admission medications   Medication Sig Start Date End Date Taking? Authorizing Provider  amLODipine (NORVASC) 2.5 MG tablet Take 1 tablet (2.5 mg total) by mouth daily. 09/27/19   Corwin Levins, MD  apixaban (ELIQUIS) 5 MG TABS tablet Take 1 tablet (5 mg total) by mouth 2 (two) times daily. 12/11/19   Myrlene Broker, MD  atorvastatin (LIPITOR) 10 MG tablet Take 10 mg by mouth daily.    [provider]  escitalopram (LEXAPRO) 10 MG tablet Take 10 mg by mouth daily.    [provider]  gabapentin (NEURONTIN) 100 MG capsule Take 1 capsule (100 mg total) by mouth at bedtime. 05/24/19   Myrlene Broker, MD  hydrochlorothiazide (MICROZIDE) 12.5  MG capsule Take 1 capsule (12.5 mg total) by mouth daily. Follow-up appt is due in must see provider for future refills 05/24/19   Myrlene Broker, MD  Multiple Vitamin (MULTIVITAMIN WITH MINERALS) TABS tablet Take 1 tablet by mouth every morning.    [provider]  Naphazoline HCl (CLEAR EYES OP) Place 1 drop into both eyes daily as needed (itching/dry eyes).    [provider]  potassium chloride (KLOR-CON) 10 MEQ tablet Take 1 tablet (10 mEq total) by mouth daily. 05/28/19   Myrlene Broker, MD  potassium chloride (KLOR-CON) 10 MEQ tablet Take 10 mEq by mouth daily. 03/28/20   [provider]  senna-docusate (SENOKOT-S) 8.6-50 MG tablet Take 1 tablet by mouth at bedtime. 05/24/19   Myrlene Broker, MD     Positive ROS: Otherwise negative  All other systems have been reviewed and were otherwise negative with the exception of those mentioned in the  HPI and as above.  Physical Exam: Constitutional: Alert, well-appearing, no acute distress Ears: External ears without lesions or tenderness.  Both ear canals have a large amount of wax in them that was cleaned with curette and forceps.  TMs were clear bilaterally. Nasal: External nose without lesions. Clear nasal passages Oral: Lips and gums without lesions. Tongue and palate mucosa without lesions. Posterior oropharynx clear. Neck: No palpable adenopathy or masses Respiratory: Breathing comfortably  Skin: No facial/neck lesions or rash noted.  Audiologic testing in the office today demonstrated moderate severe bilateral sensorineural hearing loss slightly worse on the left side with SRT's of 60 DB on the right and 65 DB on the left.  He had type A tympanograms bilaterally.  Cerumen impaction removal  Date/Time: 04/22/2020 5:14 PM Performed by: Drema Halon, MD Authorized by: Drema Halon, MD   Consent:    Consent obtained:  Verbal   Consent given by:  Patient   Risks discussed:  Pain and bleeding Procedure details:    Location:  L ear and R ear   Procedure type: curette and forceps   Post-procedure details:    Inspection:  TM intact and canal normal   Hearing quality:  Improved   Patient tolerance of procedure:  Tolerated well, no immediate complications Comments:     After cleaning the ear canals both TMs were clear.    Assessment: Cerumen buildup bilaterally Moderate to severe bilateral sensorineural hearing loss  Plan: Referred him to our audiologist to obtain new hearing aids.   Narda Bonds, MD   CC:

## 2020-04-23 DIAGNOSIS — I10 Essential (primary) hypertension: Secondary | ICD-10-CM | POA: Diagnosis not present

## 2020-04-23 DIAGNOSIS — D509 Iron deficiency anemia, unspecified: Secondary | ICD-10-CM | POA: Diagnosis not present

## 2020-04-23 DIAGNOSIS — I69354 Hemiplegia and hemiparesis following cerebral infarction affecting left non-dominant side: Secondary | ICD-10-CM | POA: Diagnosis not present

## 2020-04-23 DIAGNOSIS — M170 Bilateral primary osteoarthritis of knee: Secondary | ICD-10-CM | POA: Diagnosis not present

## 2020-04-23 NOTE — Telephone Encounter (Signed)
Spoke with the patient and he denies having any dizziness or the feeling of passing out. He has declined in a office visit.

## 2020-04-25 ENCOUNTER — Encounter (INDEPENDENT_AMBULATORY_CARE_PROVIDER_SITE_OTHER): Payer: Self-pay

## 2020-04-28 DIAGNOSIS — I69354 Hemiplegia and hemiparesis following cerebral infarction affecting left non-dominant side: Secondary | ICD-10-CM | POA: Diagnosis not present

## 2020-04-28 DIAGNOSIS — I10 Essential (primary) hypertension: Secondary | ICD-10-CM | POA: Diagnosis not present

## 2020-04-28 DIAGNOSIS — D509 Iron deficiency anemia, unspecified: Secondary | ICD-10-CM | POA: Diagnosis not present

## 2020-04-28 DIAGNOSIS — M170 Bilateral primary osteoarthritis of knee: Secondary | ICD-10-CM | POA: Diagnosis not present

## 2020-04-30 DIAGNOSIS — D509 Iron deficiency anemia, unspecified: Secondary | ICD-10-CM | POA: Diagnosis not present

## 2020-04-30 DIAGNOSIS — M170 Bilateral primary osteoarthritis of knee: Secondary | ICD-10-CM | POA: Diagnosis not present

## 2020-04-30 DIAGNOSIS — I69354 Hemiplegia and hemiparesis following cerebral infarction affecting left non-dominant side: Secondary | ICD-10-CM | POA: Diagnosis not present

## 2020-04-30 DIAGNOSIS — I10 Essential (primary) hypertension: Secondary | ICD-10-CM | POA: Diagnosis not present

## 2020-05-13 ENCOUNTER — Telehealth: Payer: Self-pay | Admitting: Pharmacist

## 2020-05-13 NOTE — Progress Notes (Addendum)
    Chronic Care Management Pharmacy Assistant   Name: Jeff Lamb  MRN: [id number] DOB: Aug 27, 1929   Reason for Encounter: Medication Coordination Call    Recent office visits:  03/31/20 Dr. Okey Dupre  Recent consult visits:  04/22/20 Dr. Malva Limes visits:  None in previous 6 months  Medications: Outpatient Encounter Medications as of 05/13/2020  Medication Sig   amLODipine (NORVASC) 2.5 MG tablet Take 1 tablet (2.5 mg total) by mouth daily.   apixaban (ELIQUIS) 5 MG TABS tablet Take 1 tablet (5 mg total) by mouth 2 (two) times daily.   atorvastatin (LIPITOR) 10 MG tablet Take 10 mg by mouth daily.   escitalopram (LEXAPRO) 10 MG tablet Take 10 mg by mouth daily.   gabapentin (NEURONTIN) 100 MG capsule Take 1 capsule (100 mg total) by mouth at bedtime.   hydrochlorothiazide (MICROZIDE) 12.5 MG capsule Take 1 capsule (12.5 mg total) by mouth daily. Follow-up appt is due in must see provider for future refills   Multiple Vitamin (MULTIVITAMIN WITH MINERALS) TABS tablet Take 1 tablet by mouth every morning.   Naphazoline HCl (CLEAR EYES OP) Place 1 drop into both eyes daily as needed (itching/dry eyes).   potassium chloride (KLOR-CON) 10 MEQ tablet Take 1 tablet (10 mEq total) by mouth daily.   potassium chloride (KLOR-CON) 10 MEQ tablet Take 10 mEq by mouth daily.   senna-docusate (SENOKOT-S) 8.6-50 MG tablet Take 1 tablet by mouth at bedtime.   No facility-administered encounter medications on file as of 05/13/2020.    Reviewed chart for medication changes ahead of medication coordination call.  No OVs, Consults, or hospital visits since last care coordination call/Pharmacist visit. (If appropriate, list visit date, provider name)  No medication changes indicated OR if recent visit, treatment plan here.  BP Readings from Last 3 Encounters:  03/31/20 132/80  11/14/19 (!) 142/80  10/08/19 132/80    Lab Results  Component Value Date   HGBA1C 6.1 03/31/2020      Patient obtains medications through Adherence Packaging  30 Days   Last adherence delivery included: Klor-Con 10 meq 1 tab daily breakfast Atorvastatin 10 mg 1 tab daily bedtime Amlodipine 2.5 mg 1 tab daily breakfast Gabapentin 100 mg 1 cap daily breakfast eliquis 5 mg 1 tab breakfast and bedtime Hydrochlorothiazide 12.5 mg 1 cap breakfast   Patient is due for next adherence delivery on: 05/14/20. Called patient and reviewed medications and coordinated delivery.  This delivery to include: Klor-Con 10 meq 1 tab daily breakfast Atorvastatin 10 mg 1 tab daily bedtime Amlodipine 2.5 mg 1 tab daily breakfast Gabapentin 100 mg 1 cap daily breakfast eliquis 5 mg 1 tab breakfast and bedtime Hydrochlorothiazide 12.5 mg 1 cap breakfast   Confirmed delivery date of 05/14/20, advised patient that pharmacy will contact them the morning of delivery.   [name] Clinical Pharmacist Assistant [phone]

## 2020-05-20 DIAGNOSIS — I639 Cerebral infarction, unspecified: Secondary | ICD-10-CM | POA: Diagnosis not present

## 2020-05-26 ENCOUNTER — Other Ambulatory Visit: Payer: Self-pay

## 2020-05-26 ENCOUNTER — Ambulatory Visit (INDEPENDENT_AMBULATORY_CARE_PROVIDER_SITE_OTHER): Payer: Medicare Other | Admitting: Pharmacist

## 2020-05-26 DIAGNOSIS — Z0000 Encounter for general adult medical examination without abnormal findings: Secondary | ICD-10-CM

## 2020-05-26 DIAGNOSIS — I10 Essential (primary) hypertension: Secondary | ICD-10-CM | POA: Diagnosis not present

## 2020-05-26 DIAGNOSIS — E782 Mixed hyperlipidemia: Secondary | ICD-10-CM | POA: Diagnosis not present

## 2020-05-26 DIAGNOSIS — Z8673 Personal history of transient ischemic attack (TIA), and cerebral infarction without residual deficits: Secondary | ICD-10-CM

## 2020-05-26 DIAGNOSIS — I2782 Chronic pulmonary embolism: Secondary | ICD-10-CM

## 2020-05-26 NOTE — Patient Instructions (Signed)
Visit Information  Phone number for Pharmacist: [phone]  Goals Addressed            This Visit's Progress   . Manage My Medicine       Timeframe:  Long-Range Goal Priority:  High Start Date:       02/28/20                      Expected End Date: 05/25/21                   Follow Up Date 12/10/20   - call for medicine refill 2 or 3 days before it runs out - call if I am sick and can't take my medicine - keep a list of all the medicines I take; vitamins and herbals too  -Utilize [facility] for medication synchronization, packaging and delivery -Contact PCP office if more home health is needed   Why is this important?   . These steps will help you keep on track with your medicines.       The patient verbalized understanding of instructions, educational materials, and care plan provided today and declined offer to receive copy of patient instructions, educational materials, and care plan.  Telephone follow up appointment with pharmacy team member scheduled for: 6 months  Al Corpus, PharmD, [place], CPP Clinical Pharmacist [facility] [phone]

## 2020-05-26 NOTE — Progress Notes (Signed)
Chronic Care Management Pharmacy Note  05/26/2020 Name:  Jeff Lamb MRN:  [id number] DOB:  04-02-1929  Subjective: Jeff Lamb is an 87 y.o. year old male who is a primary patient of Hoyt Koch, MD.  The CCM team was consulted for assistance with disease management and care coordination needs.    Engaged with patient by telephone for follow up visit in response to provider referral for pharmacy case management and/or care coordination services.   Consent to Services:  The patient was given information about Chronic Care Management services, agreed to services, and gave verbal consent prior to initiation of services.  Please see initial visit note for detailed documentation.   Patient Care Team: Hoyt Koch, MD as PCP - General (Internal Medicine) Charlton Haws, [facility] as Pharmacist (Pharmacist)   Patient lives at home alone. His cousin Legrand Rams helps him often, she is accompanying him in the visit today. He is able to cook for himself but it is difficult. He would like someone to help with housekeeping, especially laundry and changing the bedding.  Pt is in a wheelchair. He is able to walk but feels unsteady on his feet. He reports it is not easy to move around his home in a wheelchair especially over thresholds. He would like to get a new wheelchair since his current one has fabric torn on the arm. He would also like a new walker with wheels /  Tennis balls.  He would like help with applying for SCAT.   Recent office visits: 03/31/20 Dr Sharlet Salina OV: c/o weakness. Referred for Home Health/PT.  09/27/19 Dr Jenny Reichmann OV: c/o L knee pain, referred to sports med. Added amlodipine 2.5 mg for BP. Checking w/ pharmacy to see if Xarelto is less expensive than Eliquis.  Recent consult visits: 04/22/20 Dr Lucia Gaskins (ENT): cerumen impaction removal. 11/14/19 Dr Georgina Snell (sports med): L knee pain d/t DJD. Plan for injection and continued home health PT.  Hospital visits: None in  previous 6 months  Objective:  Lab Results  Component Value Date   CREATININE 1.16 03/31/2020   BUN 18 03/31/2020   GFR 56.04 (L) 03/31/2020   GFRNONAA >60 12/01/2018   GFRAA >60 12/01/2018   NA 139 03/31/2020   K 4.1 03/31/2020   CALCIUM 9.6 03/31/2020   CO2 34 (H) 03/31/2020    Lab Results  Component Value Date/Time   HGBA1C 6.1 03/31/2020 02:56 PM   HGBA1C 6.4 (H) 11/26/2018 03:53 AM   GFR 56.04 (L) 03/31/2020 02:56 PM   GFR 66.91 01/13/2016 01:20 PM    Last diabetic Eye exam: No results found for: HMDIABEYEEXA  Last diabetic Foot exam: No results found for: HMDIABFOOTEX   Lab Results  Component Value Date   CHOL 110 03/31/2020   HDL 65.10 03/31/2020   LDLCALC 36 03/31/2020   TRIG 40.0 03/31/2020   CHOLHDL 2 03/31/2020    Hepatic Function Latest Ref Rng & Units 03/31/2020 12/01/2018 11/25/2018  Total Protein 6.0 - 8.3 g/dL 7.0 4.6(L) 7.9  Albumin 3.5 - 5.2 g/dL 4.1 2.2(L) 4.0  AST 0 - 37 U/L 21 21 48(H)  ALT 0 - 53 U/L $Remo'21 20 26  'tXdxb$ Alk Phosphatase 39 - 117 U/L 48 41 69  Total Bilirubin 0.2 - 1.2 mg/dL 1.0 1.5(H) 1.6(H)    No results found for: TSH, FREET4  CBC Latest Ref Rng & Units 03/31/2020 12/01/2018 11/28/2018  WBC 4.0 - 10.5 K/uL 5.8 8.5 9.9  Hemoglobin 13.0 - 17.0 g/dL 13.9 16.4  14.3  Hematocrit 39.0 - 52.0 % 42.5 50.5 43.5  Platelets 150.0 - 400.0 K/uL 180.0 214 185    No results found for: VD25OH  Clinical ASCVD: Yes  The ASCVD Risk score Mikey Bussing DC Jr., et al., 2013) failed to calculate for the following reasons:   The 2013 ASCVD risk score is only valid for ages 68 to 76   The patient has a prior MI or stroke diagnosis    Depression screen [facility] 2/9 03/31/2020 12/21/2018 03/20/2018  Decreased Interest 0 0 0  Down, Depressed, Hopeless 0 0 0  PHQ - 2 Score 0 0 0     No flowsheet data found.   Social History   Tobacco Use  Smoking Status Former Smoker  . Packs/day: 5.00  . Years: 20.00  . Pack years: 100.00  . Types: Cigars  . Quit date:  12/22/1980  . Years since quitting: 39.4  Smokeless Tobacco Never Used   BP Readings from Last 3 Encounters:  03/31/20 132/80  11/14/19 (!) 142/80  10/08/19 132/80   Pulse Readings from Last 3 Encounters:  03/31/20 87  11/14/19 62  10/08/19 82   Wt Readings from Last 3 Encounters:  03/31/20 160 lb 6.4 oz (72.8 kg)  11/14/19 166 lb (75.3 kg)  10/08/19 166 lb (75.3 kg)    Assessment/Interventions: Review of patient past medical history, allergies, medications, health status, including review of consultants reports, laboratory and other test data, was performed as part of comprehensive evaluation and provision of chronic care management services.    SDOH:  (Social Determinants of Health) assessments and interventions performed: Yes   CCM Care Plan  No Known Allergies  Medications Reviewed Today    Reviewed by Charlton Haws, [facility] (Pharmacist) on 05/26/20 at 1546  Med List Status: <None>  Medication Order Taking? Sig Documenting Provider Last Dose Status Informant  amLODipine (NORVASC) 2.5 MG tablet [id number] Yes Take 1 tablet (2.5 mg total) by mouth daily. Biagio Borg, MD Taking Active   apixaban (ELIQUIS) 5 MG TABS tablet [id number] Yes Take 1 tablet (5 mg total) by mouth 2 (two) times daily. Hoyt Koch, MD Taking Active   atorvastatin (LIPITOR) 10 MG tablet [id number] Yes Take 10 mg by mouth daily. [provider] Taking Active   escitalopram (LEXAPRO) 10 MG tablet [id number] Yes Take 10 mg by mouth daily. [provider] Taking Active   gabapentin (NEURONTIN) 100 MG capsule [id number] Yes Take 1 capsule (100 mg total) by mouth at bedtime. Hoyt Koch, MD Taking Active   hydrochlorothiazide (MICROZIDE) 12.5 MG capsule [id number] Yes Take 1 capsule (12.5 mg total) by mouth daily. Follow-up appt is due in must see provider for future refills Hoyt Koch, MD Taking Active   Multiple Vitamin (MULTIVITAMIN WITH MINERALS) TABS tablet  [id number] Yes Take 1 tablet by mouth every morning. [provider] Taking Active Self  Naphazoline HCl (CLEAR EYES OP) [id number] Yes Place 1 drop into both eyes daily as needed (itching/dry eyes). [provider] Taking Active Self  potassium chloride (KLOR-CON) 10 MEQ tablet [id number] Yes Take 10 mEq by mouth daily. [provider] Taking Active   senna-docusate (SENOKOT-S) 8.6-50 MG tablet [id number] Yes Take 1 tablet by mouth at bedtime. Hoyt Koch, MD Taking Active           Patient Active Problem List   Diagnosis Date Noted  . Left knee pain 09/27/2019  . Left arm pain 05/24/2019  . Pulmonary embolism ([place]) 11/26/2018  .  Prediabetes 11/26/2018  . Right shoulder pain 01/17/2018  . Ventral hernia without obstruction or gangrene 03/14/2017  . History of stroke 07/12/2013  . Hyperlipidemia 07/12/2013  . Erectile dysfunction 08/02/2011  . Routine health maintenance 12/24/2010  . Hypertension     Immunization History  Administered Date(s) Administered  . DTaP 01/12/2008  . Influenza Split 10/12/2010  . Influenza, High Dose Seasonal PF 03/14/2017, 01/17/2018, 10/29/2018  . Influenza,inj,Quad PF,6+ Mos 10/16/2013, 10/13/2015  . Moderna Sars-Covid-2 Vaccination 01/08/2019, 02/05/2019  . Pneumococcal Conjugate-13 02/12/2008  . Pneumococcal Polysaccharide-23 03/21/2014  . Tdap 09/24/2016    Conditions to be addressed/monitored:  Hypertension and Hyperlipidemia, hx stroke, hx PE  Patient Care Plan: CCM Pharmacy Care Plan    Problem Identified: Hypertension and Hyperlipidemia, hx stroke, hx PE   Priority: High    Long-Range Goal: Disease management   Start Date: 02/28/2020  Expected End Date: 08/27/2020  This Visit's Progress: On track  Recent Progress: On track  Priority: High  Note:   Current Barriers:  . Unable to independently monitor therapeutic efficacy  Pharmacist Clinical Goal(s):  [name] Patient will achieve adherence to  monitoring guidelines and medication adherence to achieve therapeutic efficacy . achieve ability to self administer medications as prescribed through use of pill packs as evidenced by patient report through collaboration with PharmD and provider.   Interventions: . 1:1 collaboration with Hoyt Koch, MD regarding development and update of comprehensive plan of care as evidenced by provider attestation and co-signature . Inter-disciplinary care team collaboration (see longitudinal plan of care) . Comprehensive medication review performed; medication list updated in electronic medical record  Hypertension (BP goal <140/90) -Controlled - pt's caregiver reports BP at home is at goal; home health reported 1 episode of low BP 90s/60s in April but patient was asymptomatic -Current treatment: . Amlodipine 2.5 mg daily  . HCTZ 12.5 mg daily -Denies hypotensive/hypertensive symptoms -Educated on BP goals and benefits of medications for prevention of heart attack, stroke and kidney damage; -Counseled to monitor BP at home as needed, document, and provide log at future appointments -Recommended to continue current medication  Hyperlipidemia: (LDL goal < 70) -Hx stroke 2015, 2020 -Controlled; pt complains of pain/aches in L arm and leg that get worse with inactivity - pt has been on statin for years without issue, this pain has been happening for a few months; it is unlikely that these pains are statin-related -Current treatment: . Atorvastatin 10 mg daily -Educated on Cholesterol goals;  Benefits of statin for ASCVD risk reduction; -Recommended to continue current medication  Hx of PE (Goal: prevent subsequent VTE) -Controlled - pt reports compliance with Eliqius; does not endorse bleeding issues -PE/stroke Nov 2020, [facility]. -Current treatment  . Eliquis 5 mg BID -Assessed patient finances. Eliquis is difficult to afford in donut hole, may consider PAP later in the  year -Recommended to continue current medication  Muscle pain (Goal: manage pain) -Improving - pain improves with physical therapy/activity; pt completed round of PT a few weeks ago -Current treatment  . Gabapentin 100 mg HS . Tylenol 325 mg PRN -Recommended to continue current medication -Recommended to increase activity/movement throughout the day; may consider PT again  Health Maintenance -Vaccine gaps: covid booster -Current therapy:  . Multivitamin . Clear Eyes drops . Klor-con 10 mEq daily . Senna-docusate HS -Patient is satisfied with current therapy and denies issues -Pt is now receiving medications via [facility] pill packs and reports this is much easier than dealing with bottles -Recommended  to continue current medication  Patient Goals/Self-Care Activities . Patient will:  - take medications as prescribed -focus on medication adherence by pill packs -collaborate with provider on medication access solutions (Eliquis in donut hole) -Contact PCP office if more home health assistance is needed  Follow Up Plan: Telephone follow up appointment with care management team member scheduled for: 6 months     Medication Assistance: None required.  Patient affirms current coverage meets needs.  Patient's preferred pharmacy is:  [facility] - [place], [place] - [address] [place] [address] Phone: [phone] Fax: [phone]  Uses pill box? Yes Pt endorses 80% compliance  We discussed: Reviewed patient's [facility] medication and Epic medication profile assuring there are no discrepancies or gaps in therapy. Confirmed all fill dates appropriate and verified with patient that there is a sufficient quantity of all prescribed medications at home. Informed patient to call me any time if needing medications before scheduled deliveries.   Patient decided to: Utilize [facility] pharmacy for medication synchronization,  packaging and delivery  Care Plan and Follow Up Patient Decision:  Patient agrees to Care Plan and Follow-up.  Plan: Telephone follow up appointment with care management team member scheduled for:  6 months  Charlene Brooke, PharmD, [place], CPP Clinical Pharmacist [facility] at [facility] [phone]

## 2020-06-03 ENCOUNTER — Other Ambulatory Visit: Payer: Self-pay | Admitting: Internal Medicine

## 2020-06-05 ENCOUNTER — Telehealth: Payer: Self-pay | Admitting: Pharmacist

## 2020-06-05 NOTE — Progress Notes (Signed)
    Chronic Care Management Pharmacy Assistant   Name: Jeff Lamb  MRN: [id number] DOB: August 21, 1929  Reason for Encounter: Medication Coordination Call   Medications: Outpatient Encounter Medications as of 06/05/2020  Medication Sig  . amLODipine (NORVASC) 2.5 MG tablet Take 1 tablet (2.5 mg total) by mouth daily.  [name] atorvastatin (LIPITOR) 10 MG tablet Take 10 mg by mouth daily.  [name] ELIQUIS 5 MG TABS tablet TAKE ONE TABLET BY MOUTH EVERY MORNING and TAKE ONE TABLET BY MOUTH EVERYDAY AT BEDTIME  . escitalopram (LEXAPRO) 10 MG tablet Take 10 mg by mouth daily.  [name] gabapentin (NEURONTIN) 100 MG capsule TAKE ONE CAPSULE BY MOUTH EVERYDAY AT BEDTIME  . hydrochlorothiazide (MICROZIDE) 12.5 MG capsule TAKE ONE TABLET BY MOUTH EVERY MORNING  . Multiple Vitamin (MULTIVITAMIN WITH MINERALS) TABS tablet Take 1 tablet by mouth every morning.  . Naphazoline HCl (CLEAR EYES OP) Place 1 drop into both eyes daily as needed (itching/dry eyes).  . potassium chloride (KLOR-CON) 10 MEQ tablet Take 10 mEq by mouth daily.  [name] senna-docusate (SENOKOT-S) 8.6-50 MG tablet Take 1 tablet by mouth at bedtime.   No facility-administered encounter medications on file as of 06/05/2020.    Reviewed chart for medication changes ahead of medication coordination call.  No OVs, Consults, or hospital visits since last care coordination call/Pharmacist visit. (If appropriate, list visit date, provider name)  No medication changes indicated OR if recent visit, treatment plan here.  BP Readings from Last 3 Encounters:  03/31/20 132/80  11/14/19 (!) 142/80  10/08/19 132/80    Lab Results  Component Value Date   HGBA1C 6.1 03/31/2020     Patient obtains medications through Adherence Packaging  30 Days   Last adherence delivery included: (medication name and frequency) Klor-Con 10 meq 1 tab daily breakfast Atorvastatin 10 mg 1 tab daily bedtime Amlodipine 2.5 mg 1 tab daily breakfast Gabapentin 100 mg 1 cap daily  breakfast eliquis 5 mg 1 tab breakfast and bedtime Hydrochlorothiazide 12.5 mg 1 cap breakfast  Patient is due for next adherence delivery on: 06/11/20. Called patient and reviewed medications and coordinated delivery.  This delivery to include: Klor-Con 10 meq 1 tab daily breakfast Atorvastatin 10 mg 1 tab daily bedtime Amlodipine 2.5 mg 1 tab daily breakfast Gabapentin 100 mg 1 cap daily breakfast eliquis 5 mg 1 tab breakfast and bedtime Hydrochlorothiazide 12.5 mg 1 cap breakfast  Confirmed delivery date of 06/11/20, advised patient that pharmacy will contact them the morning of delivery.  [facility] Atorvastatin - last fill 05/14/20 30D  Benedict Needy, RMA Clinical Pharmacists Assistant [phone]  Time Spent: 30

## 2020-06-10 NOTE — Progress Notes (Signed)
    Chronic Care Management Pharmacy Assistant   Name: Jeff Lamb  MRN: [id number] DOB: 1929/10/05   Medications: Outpatient Encounter Medications as of 06/05/2020  Medication Sig  . amLODipine (NORVASC) 2.5 MG tablet Take 1 tablet (2.5 mg total) by mouth daily.  [name] atorvastatin (LIPITOR) 10 MG tablet Take 10 mg by mouth daily.  [name] ELIQUIS 5 MG TABS tablet TAKE ONE TABLET BY MOUTH EVERY MORNING and TAKE ONE TABLET BY MOUTH EVERYDAY AT BEDTIME  . escitalopram (LEXAPRO) 10 MG tablet Take 10 mg by mouth daily.  [name] gabapentin (NEURONTIN) 100 MG capsule TAKE ONE CAPSULE BY MOUTH EVERYDAY AT BEDTIME  . hydrochlorothiazide (MICROZIDE) 12.5 MG capsule TAKE ONE TABLET BY MOUTH EVERY MORNING  . Multiple Vitamin (MULTIVITAMIN WITH MINERALS) TABS tablet Take 1 tablet by mouth every morning.  . Naphazoline HCl (CLEAR EYES OP) Place 1 drop into both eyes daily as needed (itching/dry eyes).  . potassium chloride (KLOR-CON) 10 MEQ tablet Take 10 mEq by mouth daily.  [name] senna-docusate (SENOKOT-S) 8.6-50 MG tablet Take 1 tablet by mouth at bedtime.   No facility-administered encounter medications on file as of 06/05/2020.    Pharmacist Review  Reviewed chart for medication changes and adherence.  No OVs, Consults, or hospital visits since last care coordination call / Pharmacist visit. No medication changes indicated  No gaps in adherence identified. Patient has follow up scheduled with pharmacy team. No further action required.  [name] Clinical Pharmacist Assistant [phone]  Time spent:5

## 2020-06-30 ENCOUNTER — Other Ambulatory Visit: Payer: Self-pay | Admitting: Internal Medicine

## 2020-07-02 ENCOUNTER — Telehealth: Payer: Self-pay | Admitting: Pharmacist

## 2020-07-02 NOTE — Progress Notes (Signed)
    Chronic Care Management Pharmacy Assistant   Name: Jeff Lamb  MRN: [id number] DOB: Oct 02, 1929   Reason for Encounter: Medication Review    Recent office visits:  None ID  Recent consult visits:  None ID  Hospital visits:  None in previous 6 months  Medications: Outpatient Encounter Medications as of 07/02/2020  Medication Sig   amLODipine (NORVASC) 2.5 MG tablet TAKE ONE TABLET BY MOUTH EVERY MORNING   atorvastatin (LIPITOR) 10 MG tablet Take 10 mg by mouth daily.   ELIQUIS 5 MG TABS tablet TAKE ONE TABLET BY MOUTH EVERY MORNING and TAKE ONE TABLET BY MOUTH EVERYDAY AT BEDTIME   escitalopram (LEXAPRO) 10 MG tablet Take 10 mg by mouth daily.   gabapentin (NEURONTIN) 100 MG capsule TAKE ONE CAPSULE BY MOUTH EVERYDAY AT BEDTIME   hydrochlorothiazide (MICROZIDE) 12.5 MG capsule TAKE ONE TABLET BY MOUTH EVERY MORNING   Multiple Vitamin (MULTIVITAMIN WITH MINERALS) TABS tablet Take 1 tablet by mouth every morning.   Naphazoline HCl (CLEAR EYES OP) Place 1 drop into both eyes daily as needed (itching/dry eyes).   potassium chloride (KLOR-CON) 10 MEQ tablet Take 10 mEq by mouth daily.   senna-docusate (SENOKOT-S) 8.6-50 MG tablet Take 1 tablet by mouth at bedtime.   No facility-administered encounter medications on file as of 07/02/2020.    Pharmacist Review  Reviewed chart for medication changes ahead of medication coordination call.  No OVs, Consults, or hospital visits since last care coordination call/Pharmacist visit. (If appropriate, list visit date, provider name)  No medication changes indicated OR if recent visit, treatment plan here.  BP Readings from Last 3 Encounters:  03/31/20 132/80  11/14/19 (!) 142/80  10/08/19 132/80    Lab Results  Component Value Date   HGBA1C 6.1 03/31/2020     Patient obtains medications through Adherence Packaging  30 Days   Last adherence delivery included:  Klor-Con 10 meq 1 tab daily breakfast Atorvastatin 10 mg 1 tab  daily bedtime Amlodipine 2.5 mg 1 tab daily breakfast Gabapentin 100 mg 1 cap daily breakfast eliquis 5 mg 1 tab breakfast and bedtime Hydrochlorothiazide 12.5 mg 1 cap breakfast    Patient is due for next adherence delivery on: 07/10/20. Called patient and reviewed medications and coordinated delivery.  This delivery to include: Klor-Con 10 meq 1 tab daily breakfast Atorvastatin 10 mg 1 tab daily bedtime Amlodipine 2.5 mg 1 tab daily breakfast Gabapentin 100 mg 1 cap daily breakfast eliquis 5 mg 1 tab breakfast and bedtime Hydrochlorothiazide 12.5 mg 1 cap breakfast    Patient needs refills for None ID.  Confirmed delivery date of 07/10/20, advised patient that pharmacy will contact them the morning of delivery.   [name] Clinical Pharmacist Assistant [phone]   Time spent:20

## 2020-07-31 ENCOUNTER — Telehealth: Payer: Self-pay | Admitting: Pharmacist

## 2020-07-31 DIAGNOSIS — F419 Anxiety disorder, unspecified: Secondary | ICD-10-CM

## 2020-07-31 NOTE — Chronic Care Management (AMB) (Signed)
    Chronic Care Management Pharmacy Assistant   Name: DEARIS DANIS  MRN: [id number] DOB: October 22, 1929   Reason for Encounter: Medication Adherence Review   Hospital visits:  None in previous 6 months  Medications: Outpatient Encounter Medications as of 07/31/2020  Medication Sig   amLODipine (NORVASC) 2.5 MG tablet TAKE ONE TABLET BY MOUTH EVERY MORNING   atorvastatin (LIPITOR) 10 MG tablet Take 10 mg by mouth daily.   ELIQUIS 5 MG TABS tablet TAKE ONE TABLET BY MOUTH EVERY MORNING and TAKE ONE TABLET BY MOUTH EVERYDAY AT BEDTIME   escitalopram (LEXAPRO) 10 MG tablet Take 10 mg by mouth daily.   gabapentin (NEURONTIN) 100 MG capsule TAKE ONE CAPSULE BY MOUTH EVERYDAY AT BEDTIME   hydrochlorothiazide (MICROZIDE) 12.5 MG capsule TAKE ONE TABLET BY MOUTH EVERY MORNING   Multiple Vitamin (MULTIVITAMIN WITH MINERALS) TABS tablet Take 1 tablet by mouth every morning.   Naphazoline HCl (CLEAR EYES OP) Place 1 drop into both eyes daily as needed (itching/dry eyes).   potassium chloride (KLOR-CON) 10 MEQ tablet Take 10 mEq by mouth daily.   senna-docusate (SENOKOT-S) 8.6-50 MG tablet Take 1 tablet by mouth at bedtime.   No facility-administered encounter medications on file as of 07/31/2020.    Reviewed chart for medication changes ahead of medication coordination call.  No OVs, Consults, or hospital visits since last care coordination call/Pharmacist visit. (If appropriate, list visit date, provider name)  No medication changes indicated OR if recent visit, treatment plan here.  BP Readings from Last 3 Encounters:  03/31/20 132/80  11/14/19 (!) 142/80  10/08/19 132/80    Lab Results  Component Value Date   HGBA1C 6.1 03/31/2020    Patient obtains medications through Adherence Packaging  30 Days    Last adherence delivery included:  Klor-Con 10 meq 1 tab daily breakfast Atorvastatin 10 mg 1 tab daily bedtime Amlodipine 2.5 mg 1 tab daily breakfast Gabapentin 100 mg 1 cap daily  breakfast eliquis 5 mg 1 tab breakfast and bedtime Hydrochlorothiazide 12.5 mg 1 cap breakfast   Patient is due for next adherence delivery on:08/11/20 Called patient and reviewed medications and coordinated delivery.  This delivery to include: Klor-Con 10 meq 1 tab daily breakfast Atorvastatin 10 mg 1 tab daily bedtime Amlodipine 2.5 mg 1 tab daily breakfast Gabapentin 100 mg 1 cap daily breakfast eliquis 5 mg 1 tab breakfast and bedtime Hydrochlorothiazide 12.5 mg 1 cap breakfast   Medications amLODipine (NORVASC) 2.5 MG tablet - last filled 07/07/20 30 days  atorvastatin (LIPITOR) 10 MG tablet - last filled 07/07/20 30 days  ELIQUIS 5 MG TABS tablet - last filled 07/07/20 30 days  escitalopram (LEXAPRO) 10 MG tablet - last filled 04/11/20 90 days gabapentin (NEURONTIN) 100 MG capsule - last filled 07/07/20 30 days  hydrochlorothiazide (MICROZIDE) 12.5 MG capsule - last filled 05/22/18 90 days  Multiple Vitamin (MULTIVITAMIN WITH MINERALS) TABS tablet Naphazoline HCl (CLEAR EYES OP) potassium chloride (KLOR-CON) 10 MEQ tablet - last filled 07/07/20 30 days  Senna-docusate (SENOKOT-S) 8.6-50 MG tablet   [facility]: atorvastatin (LIPITOR) 10 MG tablet - last filled 07/07/20 30 days   [name] Clinical Pharmacist Assistant [phone]

## 2020-08-01 NOTE — Telephone Encounter (Signed)
Patient has been on escitalopram 10 mg for over a year. Pt is not sure who prescribed it for him. [facility] transferred the Rx from [facility] who had prescriber as Jeff Lamb. When rx ran out, new prescription was requested from this prescriber however it was denied, stating the patient is unknown to the prescriber.  Will see if PCP is willing to take over prescribing of escitalopram 10 mg.

## 2020-08-04 MED ORDER — ESCITALOPRAM OXALATE 10 MG PO TABS: 10.0000 mg | ORAL_TABLET | Freq: Every day | ORAL | 0 refills | Status: DC

## 2020-08-04 NOTE — Addendum Note (Signed)
Addended by: Kathyrn Sheriff on: 08/04/2020 03:35 PM   Modules accepted: Orders

## 2020-08-04 NOTE — Telephone Encounter (Signed)
Fine for me to prescribe lexapro 10 mg daily.

## 2020-08-18 ENCOUNTER — Telehealth: Payer: Self-pay | Admitting: Internal Medicine

## 2020-08-18 NOTE — Telephone Encounter (Addendum)
   What type of DME ([employer]) would you like your provider to order? WHEELCHAIR  Who would like to get the DME from? NO PREFERENCE  Last visit with PCP (>3 months requires and appointment for insurance to cover the cost = please schedule patient for visit to discuss medical necessity for DME)? 03/31/20  Spouse requesting order as soon as possible. Scheduler made appointment for 8/11. Spouse feels appointment not needed.   Please call

## 2020-08-18 NOTE — Telephone Encounter (Signed)
See below

## 2020-08-19 NOTE — Telephone Encounter (Signed)
Are they asking for manual or power wheelchair? If manual likely visit is needed. If power we would need PT evaluation prior to an office visit so that will need to be arranged first.

## 2020-08-20 NOTE — Telephone Encounter (Signed)
LVM asking them to return my call to discuss. Office number was provided.

## 2020-08-21 ENCOUNTER — Encounter: Payer: Self-pay | Admitting: Internal Medicine

## 2020-08-21 ENCOUNTER — Ambulatory Visit (INDEPENDENT_AMBULATORY_CARE_PROVIDER_SITE_OTHER): Payer: Medicare Other | Admitting: Internal Medicine

## 2020-08-21 ENCOUNTER — Other Ambulatory Visit: Payer: Self-pay

## 2020-08-21 VITALS — BP 128/82 | HR 67 | Temp 98.8°F | Resp 18 | Ht 70.0 in

## 2020-08-21 DIAGNOSIS — Z8673 Personal history of transient ischemic attack (TIA), and cerebral infarction without residual deficits: Secondary | ICD-10-CM | POA: Diagnosis not present

## 2020-08-21 NOTE — Patient Instructions (Signed)
We will get you the physical therapy assessment and then have a visit afterwards. Pick the home health company you want and we will work with them.

## 2020-08-21 NOTE — Progress Notes (Signed)
   Subjective:   Patient ID: Jeff Lamb, male    DOB: 1929/07/05, 87 y.o.   MRN: [id number]  HPI The patient is an 87 YO man coming in for concerns about mobility and needs electric wheelchair. Past stroke and unable to use left side and this is limiting ADLs.   Review of Systems  Constitutional: Negative.   HENT: Negative.    Eyes: Negative.   Respiratory:  Negative for cough, chest tightness and shortness of breath.   Cardiovascular:  Negative for chest pain, palpitations and leg swelling.  Gastrointestinal:  Negative for abdominal distention, abdominal pain, constipation, diarrhea, nausea and vomiting.  Musculoskeletal:  Positive for arthralgias.  Skin: Negative.   Neurological:  Positive for weakness and numbness.  Psychiatric/Behavioral: Negative.     Objective:  Physical Exam Constitutional:      Appearance: He is well-developed.  HENT:     Head: Normocephalic and atraumatic.  Cardiovascular:     Rate and Rhythm: Normal rate and regular rhythm.  Pulmonary:     Effort: Pulmonary effort is normal. No respiratory distress.     Breath sounds: Normal breath sounds. No wheezing or rales.  Abdominal:     General: Bowel sounds are normal. There is no distension.     Palpations: Abdomen is soft.     Tenderness: There is no abdominal tenderness. There is no rebound.  Musculoskeletal:     Cervical back: Normal range of motion.  Skin:    General: Skin is warm and dry.  Neurological:     Mental Status: He is alert and oriented to person, place, and time.     Cranial Nerves: Cranial nerve deficit present.     Sensory: Sensory deficit present.     Coordination: Coordination abnormal.     Comments: Left side with weakness and inability to use s/p stroke    Vitals:   08/21/20 1420  BP: 128/82  Pulse: 67  Resp: 18  Temp: 98.8 F (37.1 C)  TempSrc: Oral  SpO2: 97%  Height: 5\' 10"  (1.778 m)    This visit occurred during the SARS-CoV-2 public health emergency.  Safety  protocols were in place, including screening questions prior to the visit, additional usage of staff PPE, and extensive cleaning of exam room while observing appropriate contact time as indicated for disinfecting solutions.   Assessment & Plan:

## 2020-08-22 NOTE — Assessment & Plan Note (Signed)
Refer to PT to assess if he qualifies and can use power wheelchair. Needs visit afterwards to review and fulfill insurance requirements.

## 2020-08-25 ENCOUNTER — Encounter (HOSPITAL_COMMUNITY): Payer: Self-pay | Admitting: *Deleted

## 2020-08-25 ENCOUNTER — Emergency Department (HOSPITAL_COMMUNITY)
Admission: EM | Admit: 2020-08-25 | Discharge: 2020-08-26 | Disposition: A | Discharge status: Home / Self Care | Payer: Medicare Other | Attending: Emergency Medicine | Admitting: Emergency Medicine

## 2020-08-25 ENCOUNTER — Other Ambulatory Visit: Payer: Self-pay

## 2020-08-25 ENCOUNTER — Emergency Department (HOSPITAL_COMMUNITY): Payer: Medicare Other

## 2020-08-25 DIAGNOSIS — Z743 Need for continuous supervision: Secondary | ICD-10-CM | POA: Diagnosis not present

## 2020-08-25 DIAGNOSIS — I6381 Other cerebral infarction due to occlusion or stenosis of small artery: Secondary | ICD-10-CM | POA: Diagnosis not present

## 2020-08-25 DIAGNOSIS — Z8673 Personal history of transient ischemic attack (TIA), and cerebral infarction without residual deficits: Secondary | ICD-10-CM | POA: Diagnosis not present

## 2020-08-25 DIAGNOSIS — Z7901 Long term (current) use of anticoagulants: Secondary | ICD-10-CM | POA: Diagnosis not present

## 2020-08-25 DIAGNOSIS — R5383 Other fatigue: Secondary | ICD-10-CM | POA: Insufficient documentation

## 2020-08-25 DIAGNOSIS — R6889 Other general symptoms and signs: Secondary | ICD-10-CM | POA: Diagnosis not present

## 2020-08-25 DIAGNOSIS — Z79899 Other long term (current) drug therapy: Secondary | ICD-10-CM | POA: Insufficient documentation

## 2020-08-25 DIAGNOSIS — Z87891 Personal history of nicotine dependence: Secondary | ICD-10-CM | POA: Insufficient documentation

## 2020-08-25 DIAGNOSIS — I10 Essential (primary) hypertension: Secondary | ICD-10-CM | POA: Diagnosis not present

## 2020-08-25 DIAGNOSIS — I6782 Cerebral ischemia: Secondary | ICD-10-CM | POA: Diagnosis not present

## 2020-08-25 DIAGNOSIS — M533 Sacrococcygeal disorders, not elsewhere classified: Secondary | ICD-10-CM | POA: Diagnosis not present

## 2020-08-25 DIAGNOSIS — R001 Bradycardia, unspecified: Secondary | ICD-10-CM | POA: Insufficient documentation

## 2020-08-25 DIAGNOSIS — R251 Tremor, unspecified: Secondary | ICD-10-CM | POA: Diagnosis not present

## 2020-08-25 DIAGNOSIS — G9389 Other specified disorders of brain: Secondary | ICD-10-CM | POA: Diagnosis not present

## 2020-08-25 LAB — URINALYSIS, ROUTINE W REFLEX MICROSCOPIC
Bacteria, UA: NONE SEEN
Bilirubin Urine: NEGATIVE
Glucose, UA: NEGATIVE mg/dL
Ketones, ur: 5 mg/dL — AB
Leukocytes,Ua: NEGATIVE
Nitrite: NEGATIVE
Protein, ur: NEGATIVE mg/dL
Specific Gravity, Urine: 1.012 (ref 1.005–1.030)
pH: 6 (ref 5.0–8.0)

## 2020-08-25 LAB — CBC WITH DIFFERENTIAL/PLATELET
Abs Immature Granulocytes: 0.01 10*3/uL (ref 0.00–0.07)
Basophils Absolute: 0 10*3/uL (ref 0.0–0.1)
Basophils Relative: 1 %
Eosinophils Absolute: 0.1 10*3/uL (ref 0.0–0.5)
Eosinophils Relative: 2 %
HCT: 45.6 % (ref 39.0–52.0)
Hemoglobin: 14.4 g/dL (ref 13.0–17.0)
Immature Granulocytes: 0 %
Lymphocytes Relative: 25 %
Lymphs Abs: 1.5 10*3/uL (ref 0.7–4.0)
MCH: 31.3 pg (ref 26.0–34.0)
MCHC: 31.6 g/dL (ref 30.0–36.0)
MCV: 99.1 fL (ref 80.0–100.0)
Monocytes Absolute: 0.5 10*3/uL (ref 0.1–1.0)
Monocytes Relative: 9 %
Neutro Abs: 3.7 10*3/uL (ref 1.7–7.7)
Neutrophils Relative %: 63 %
Platelets: 180 10*3/uL (ref 150–400)
RBC: 4.6 MIL/uL (ref 4.22–5.81)
RDW: 11.9 % (ref 11.5–15.5)
WBC: 5.9 10*3/uL (ref 4.0–10.5)
nRBC: 0 % (ref 0.0–0.2)

## 2020-08-25 LAB — COMPREHENSIVE METABOLIC PANEL
ALT: 20 U/L (ref 0–44)
AST: 26 U/L (ref 15–41)
Albumin: 3.5 g/dL (ref 3.5–5.0)
Alkaline Phosphatase: 55 U/L (ref 38–126)
Anion gap: 11 (ref 5–15)
BUN: 13 mg/dL (ref 8–23)
CO2: 28 mmol/L (ref 22–32)
Calcium: 9.3 mg/dL (ref 8.9–10.3)
Chloride: 99 mmol/L (ref 98–111)
Creatinine, Ser: 1.04 mg/dL (ref 0.61–1.24)
GFR, Estimated: >60 mL/min (ref >60)
Glucose, Bld: 89 mg/dL (ref 70–99)
Potassium: 4.7 mmol/L (ref 3.5–5.1)
Sodium: 138 mmol/L (ref 135–145)
Total Bilirubin: 1.6 mg/dL — ABNORMAL HIGH (ref 0.3–1.2)
Total Protein: 6.5 g/dL (ref 6.5–8.1)

## 2020-08-25 LAB — MAGNESIUM: Magnesium: 2 mg/dL (ref 1.7–2.4)

## 2020-08-25 NOTE — ED Notes (Signed)
Called no answer X2 

## 2020-08-25 NOTE — ED Provider Notes (Signed)
Emergency Medicine Provider Triage Evaluation Note  Jeff Lamb , a 87 y.o. male  was evaluated in triage.  Pt complains of fatigue and "pain all over" with EMS. Pt with hx of stroke and left sided deficits, negative stroke screen today. Pt states his pain is mostly in his sacrum at this time. Reports he fell off of a chair today. He denies head injury or LOC.  Review of Systems  Positive: + sacral pain Negative: - head injury, LOC  Physical Exam  BP (!) 149/88 (BP Location: Right Arm)   Pulse (!) 49   Temp 97.8 F (36.6 C)   Resp 16   SpO2 97%  Gen:   Awake, no distress   Resp:  Normal effort  MSK:   Moves extremities without difficulty  Other:    Medical Decision Making  Medically screening exam initiated at 8:55 AM.  Appropriate orders placed.  [name] was informed that the remainder of the evaluation will be completed by another provider, this initial triage assessment does not replace that evaluation, and the importance of remaining in the ED until their evaluation is complete.     Tanda Rockers, PA-C 08/25/20 [id number]    Benjiman Core, MD 08/25/20 1517

## 2020-08-25 NOTE — Discharge Instructions (Addendum)
Your work-up here was reassuring.  The CT scan was negative for any head bleed or stroke.  Your blood work was all within acceptable limits and your urine was not infected.  Please follow-up with your primary care doctor.  Please not hesitate to return to the emergency department if your symptoms significantly change or worsen.  Please continue taking your statin and blood thinner as you have been and follow-up with your primary care doctor.

## 2020-08-25 NOTE — ED Provider Notes (Signed)
[facility] EMERGENCY DEPARTMENT Provider Note   CSN: [id number] Arrival date & time: 08/25/20  0815     History Chief Complaint  Patient presents with   Fatigue    Jeff Lamb is a 87 y.o. male.  HPI  87 year old male with past medical history of hypertension, previous stroke with residual left-sided deficits presenting to the emergency department with fatigue.  Patient reports that he slipped out of his wheelchair at home and fell onto his bottom, with his wheelchair flipping over on top of him.  He was doing well since that happened 2 days ago.  This morning, he woke up slightly fatigued and then "everything went haywire".  Patient states that he felt gradually more tired today and he felt his body "shaking".  He states that his shaking was worse in his bilateral upper extremities.  He states that he got to the point where he was concerned about all of his tremors and he called his cousin.  He states that when he was speaking to his cousin, he felt like his speech was slurred and so he called 911.  He states that by the time the ambulance arrived, his speech was back to normal.  He denies any headache, chest pain, nausea, vomiting, or diarrhea.  Patient states that he takes Eliquis for atrial fibrillation.  He states that 2 days ago, he did not fall or hit his head.  He denies any dysuria.  Patient states that he lives at home alone, but feels safe at home.  He uses a wheelchair to get around and is currently working with his primary care doctor to get a powered wheelchair.  Past Medical History:  Diagnosis Date   Biceps tendonitis 12/24/2010   Hearing loss of both ears    bilateral due to [place] damage [place]   Hypertension     Patient Active Problem List   Diagnosis Date Noted   Left knee pain 09/27/2019   Left arm pain 05/24/2019   Pulmonary embolism (HCC) 11/26/2018   Prediabetes 11/26/2018   Right shoulder pain 01/17/2018   Ventral hernia without  obstruction or gangrene 03/14/2017   History of stroke 07/12/2013   Hyperlipidemia 07/12/2013   Erectile dysfunction 08/02/2011   Routine health maintenance 12/24/2010   Hypertension     Past Surgical History:  Procedure Laterality Date   APPENDECTOMY     31   CATARACT EXTRACTION W/ INTRAOCULAR LENS IMPLANT  October '12   left.       Family History  Problem Relation Age of Onset   Stroke Mother    Hypertension Mother    Cancer Maternal Uncle        throat cancer   Cancer Maternal Uncle        throat cancer   Diabetes Neg Hx    Heart disease Neg Hx     Social History   Tobacco Use   Smoking status: Former    Packs/day: 5.00    Years: 20.00    Pack years: 100.00    Types: Cigars, Cigarettes    Quit date: 12/22/1980    Years since quitting: 39.7   Smokeless tobacco: Never  Vaping Use   Vaping Use: Never used  Substance Use Topics   Alcohol use: Yes    Comment: ,moderate use   Drug use: No    Home Medications Prior to Admission medications   Medication Sig Start Date End Date Taking? Authorizing Provider  amLODipine (NORVASC) 2.5 MG tablet TAKE  ONE TABLET BY MOUTH EVERY MORNING 06/30/20   Myrlene Brokerrawford, Elizabeth A, MD  atorvastatin (LIPITOR) 10 MG tablet Take 10 mg by mouth daily.    [provider]  ELIQUIS 5 MG TABS tablet TAKE ONE TABLET BY MOUTH EVERY MORNING and TAKE ONE TABLET BY MOUTH EVERYDAY AT BEDTIME 06/04/20   Myrlene Brokerrawford, Elizabeth A, MD  escitalopram (LEXAPRO) 10 MG tablet Take 1 tablet (10 mg total) by mouth daily. 08/04/20   Myrlene Brokerrawford, Elizabeth A, MD  gabapentin (NEURONTIN) 100 MG capsule TAKE ONE CAPSULE BY MOUTH EVERYDAY AT BEDTIME 06/04/20   Myrlene Brokerrawford, Elizabeth A, MD  hydrochlorothiazide (MICROZIDE) 12.5 MG capsule TAKE ONE TABLET BY MOUTH EVERY MORNING 06/04/20   Myrlene Brokerrawford, Elizabeth A, MD  Multiple Vitamin (MULTIVITAMIN WITH MINERALS) TABS tablet Take 1 tablet by mouth every morning.    [provider]  Naphazoline HCl (CLEAR EYES OP)  Place 1 drop into both eyes daily as needed (itching/dry eyes).    [provider]  potassium chloride (KLOR-CON) 10 MEQ tablet Take 10 mEq by mouth daily. 03/28/20   [provider]  senna-docusate (SENOKOT-S) 8.6-50 MG tablet Take 1 tablet by mouth at bedtime. 05/24/19   Myrlene Brokerrawford, Elizabeth A, MD    Allergies    Patient has no known allergies.  Review of Systems   Review of Systems  Constitutional:  Negative for chills and fever.  HENT:  Negative for ear pain and sore throat.   Eyes:  Negative for pain and visual disturbance.  Respiratory:  Negative for cough and shortness of breath.   Cardiovascular:  Negative for chest pain and palpitations.  Gastrointestinal:  Negative for abdominal pain and vomiting.  Genitourinary:  Negative for dysuria and hematuria.  Musculoskeletal:  Negative for arthralgias and back pain.  Skin:  Negative for color change and rash.  Neurological:  Positive for tremors and speech difficulty. Negative for seizures, syncope, weakness, numbness and headaches.  All other systems reviewed and are negative.  Physical Exam Updated Vital Signs BP 137/85 (BP Location: Right Arm)   Pulse 64   Temp 98 F (36.7 C) (Oral)   Resp 18   SpO2 99%   Physical Exam Vitals and nursing note reviewed.  Constitutional:      General: He is not in acute distress.    Appearance: Normal appearance. He is well-developed and normal weight. He is not ill-appearing or toxic-appearing.  HENT:     Head: Normocephalic and atraumatic.  Eyes:     Conjunctiva/sclera: Conjunctivae normal.  Cardiovascular:     Rate and Rhythm: Normal rate and regular rhythm.     Heart sounds: No murmur heard. Pulmonary:     Effort: Pulmonary effort is normal. No respiratory distress.     Breath sounds: Normal breath sounds.  Abdominal:     Palpations: Abdomen is soft.     Tenderness: There is no abdominal tenderness.  Musculoskeletal:     Cervical back: Neck supple.  Skin:     General: Skin is warm and dry.     Capillary Refill: Capillary refill takes less than 2 seconds.  Neurological:     Mental Status: He is alert. Mental status is at baseline.     Comments: 4-5 strength in left upper and left lower extremity, at baseline per patient.  5 out of 5 strength in the right upper and right lower extremity.  No dysdiadochokinesia.  No past-pointing.  Speech is not slurred.  No aphasia.  Cranial nerves II through XII grossly intact.  ED Results / Procedures / Treatments   Labs (all labs ordered are listed, but only abnormal results are displayed) Labs Reviewed  COMPREHENSIVE METABOLIC PANEL - Abnormal; Notable for the following components:      Result Value   Total Bilirubin 1.6 (*)    All other components within normal limits  URINALYSIS, ROUTINE W REFLEX MICROSCOPIC - Abnormal; Notable for the following components:   Hgb urine dipstick SMALL (*)    Ketones, ur 5 (*)    All other components within normal limits  CBC WITH DIFFERENTIAL/PLATELET  MAGNESIUM    EKG EKG Interpretation  Date/Time:  Monday August 25 2020 08:47:51 EDT Ventricular Rate:  49 PR Interval:  188 QRS Duration: 92 QT Interval:  470 QTC Calculation: 424 R Axis:   -58 Text Interpretation: Sinus bradycardia Left anterior fascicular block Nonspecific T wave abnormality Abnormal ECG Confirmed by Norman Clay ([id number]) on 08/25/2020 4:49:44 PM  Radiology DG Sacrum/Coccyx  Result Date: 08/25/2020 CLINICAL DATA:  Fall, sacral pain, initial encounter. EXAM: SACRUM AND COCCYX - 2+ VIEW COMPARISON:  None. FINDINGS: No acute osseous abnormality. Degenerative changes in the visualized spine. IMPRESSION: No acute findings. Electronically Signed   By: Leanna Battles M.D.   On: 08/25/2020 10:10   CT HEAD WO CONTRAST ( )  Result Date: 08/25/2020 CLINICAL DATA:  Fall from chair today. Fatigue. History of CVA and left-sided deficits. Negative stroke screen today. Evaluate for bleed. EXAM: CT HEAD  WITHOUT CONTRAST TECHNIQUE: Contiguous axial images were obtained from the base of the skull through the vertex without intravenous contrast. COMPARISON:  12/01/2018 head CT. FINDINGS: Brain: Bilateral chronic cerebellar hemisphere lacunar infarcts. Right occipital lobe encephalomalacia with ex vacuo dilatation of the right occipital horn. Nonspecific moderate subcortical and periventricular white matter hypodensity, most in keeping with chronic small vessel ischemic change. No evidence of parenchymal hemorrhage or extra-axial fluid collection. No mass lesion, mass effect, or midline shift. No CT evidence of acute infarction. Generalized cerebral volume loss. Vascular: No acute abnormality. Skull: No evidence of calvarial fracture. Sinuses/Orbits: The visualized paranasal sinuses are essentially clear. Other:  The mastoid air cells are unopacified. IMPRESSION: 1. No evidence of acute intracranial abnormality. No evidence of calvarial fracture. 2. Bilateral chronic cerebellar hemisphere lacunar infarcts. 3. Right occipital lobe encephalomalacia with ex vacuo dilatation of the right occipital horn. 4. Generalized cerebral volume loss and moderate chronic small vessel ischemic changes in the cerebral white matter. Electronically Signed   By: Delbert Phenix M.D.   On: 08/25/2020 17:24    Procedures Procedures   Medications Ordered in ED Medications - No data to display  ED Course  I have reviewed the triage vital signs and the nursing notes.  Pertinent labs & imaging results that were available during my care of the patient were reviewed by me and considered in my medical decision making (see chart for details).    MDM Rules/Calculators/A&P                           87 year old male with above past medical history presenting with fatigue today in the setting of a recent fall from his wheelchair 2 days ago.  Vital signs reviewed on arrival, within acceptable limits.  Physical exam is notable for an  elderly male with no focal neurologic deficits other than his baseline left-sided weakness.  Differential includes acidosis, electrolyte derangement, infection, CVA, TIA.  Patient had a previous stroke work-up including echocardiogram and transcranial Dopplers, no carotid  artery disease seen on CTA 2 years ago.  The patient is already fully anticoagulated.  He does not endorse true word finding difficulty or expressive aphasia that I would expect if this was a true TIA, he mostly reports a vague sense of his speech being "off".  We will obtain a CBC, BMP, urine studies to evaluate for signs of infection or metabolic derangement.  Also obtain a CT head to evaluate for possible chronic subdural that could be causing his symptoms, as he had a minor mechanism fall 2 days ago.  The patient has no new focal neurologic deficits, I do not believe that MRI imaging is indicated.  CT head shows multiple chronic vascular changes, but no acute abnormalities.  No leukocytosis.  No significant anemia.  Electrolytes within acceptable limits.  Urine is not infected.  After a nearly 13-hour observation in the emergency department on my reassessment the patient is resting comfortably.  He is alert and interactive and at his neurologic baseline.  I believe that he is stable for discharge from the emergency department with close follow-up with his primary care physician.  The patient is comfortable with this plan and wishes to be discharged.  He states that he feels safe going home tonight.  Strict return precautions provided and verbal and written format.  Final Clinical Impression(s) / ED Diagnoses Final diagnoses:  Other fatigue    Rx / DC Orders ED Discharge Orders     None        Lenard Lance, MD 08/25/20 2112    Cheryll Cockayne, MD 08/25/20 2117

## 2020-08-25 NOTE — ED Notes (Addendum)
Discharge instructions reviewed with patient, pt has no questions. Pt states he is wheelchair bound, phone is dead, and cannot remember the number for a ride. Pt states that numbers in chart [place] and will not know the number to pick him up.

## 2020-08-25 NOTE — ED Notes (Signed)
Pt is hard of hearing  

## 2020-08-25 NOTE — ED Notes (Signed)
Called pt x3 for vitals, no response. 

## 2020-08-25 NOTE — ED Notes (Signed)
Pt eating at bedside, denies any further needs

## 2020-08-25 NOTE — ED Triage Notes (Signed)
Pt arrived by [facility] from home. Pt reported pain all over and not feeling well. Hx of stroke with left side deficits.

## 2020-08-25 NOTE — ED Notes (Signed)
[facility] called pt is number 7 on the list 

## 2020-08-25 NOTE — ED Notes (Signed)
[facility] to be called for patient  

## 2020-08-26 DIAGNOSIS — R5383 Other fatigue: Secondary | ICD-10-CM | POA: Diagnosis not present

## 2020-08-26 DIAGNOSIS — Z7401 Bed confinement status: Secondary | ICD-10-CM | POA: Diagnosis not present

## 2020-08-26 NOTE — ED Notes (Signed)
[facility] called for pick up at 9:30a

## 2020-08-26 NOTE — ED Notes (Signed)
Pt updated on plan of care. Pt given urinal and cup of coffee.

## 2020-08-26 NOTE — ED Notes (Signed)
Patient verbalizes understanding of discharge instructions. Opportunity for questioning and answers were provided. Armband removed by staff, pt discharged from ED via [facility].  

## 2020-08-26 NOTE — ED Notes (Signed)
Pt returned back to ED by [facility]. Per [facility] unable to get into pt apartment, [facility] tried to call pt apartment maintenance, maintenance refused to come unlock the door stating that they do not come after hours.

## 2020-08-26 NOTE — ED Notes (Signed)
Adah Salvage, pt cousin contacted - number is [phone] - Per Ivor Messier she does not have pt keys - she will have to contact the apartment in the morning at 9am and have them open the door, she asks that [facility] have pt picked up at 9:30 in the morning.

## 2020-08-27 ENCOUNTER — Ambulatory Visit: Payer: Medicare Other

## 2020-08-29 ENCOUNTER — Telehealth: Payer: Self-pay | Admitting: Pharmacist

## 2020-08-29 NOTE — Progress Notes (Addendum)
Opened in error

## 2020-09-02 ENCOUNTER — Telehealth: Payer: Self-pay | Admitting: Pharmacist

## 2020-09-02 NOTE — Progress Notes (Signed)
    Chronic Care Management Pharmacy Assistant   Name: ERIBERTO FELCH  MRN: [id number] DOB: 1929-01-25   Reason for Encounter: Medication Review    Medications: Outpatient Encounter Medications as of 09/02/2020  Medication Sig   amLODipine (NORVASC) 2.5 MG tablet TAKE ONE TABLET BY MOUTH EVERY MORNING   atorvastatin (LIPITOR) 10 MG tablet Take 10 mg by mouth daily.   ELIQUIS 5 MG TABS tablet TAKE ONE TABLET BY MOUTH EVERY MORNING and TAKE ONE TABLET BY MOUTH EVERYDAY AT BEDTIME   escitalopram (LEXAPRO) 10 MG tablet Take 1 tablet (10 mg total) by mouth daily.   gabapentin (NEURONTIN) 100 MG capsule TAKE ONE CAPSULE BY MOUTH EVERYDAY AT BEDTIME   hydrochlorothiazide (MICROZIDE) 12.5 MG capsule TAKE ONE TABLET BY MOUTH EVERY MORNING   Multiple Vitamin (MULTIVITAMIN WITH MINERALS) TABS tablet Take 1 tablet by mouth every morning.   Naphazoline HCl (CLEAR EYES OP) Place 1 drop into both eyes daily as needed (itching/dry eyes).   potassium chloride (KLOR-CON) 10 MEQ tablet Take 10 mEq by mouth daily.   senna-docusate (SENOKOT-S) 8.6-50 MG tablet Take 1 tablet by mouth at bedtime.   No facility-administered encounter medications on file as of 09/02/2020.   Reviewed chart for medication changes ahead of medication coordination call.  No OVs, Consults, or hospital visits since last care coordination call/Pharmacist visit. (If appropriate, list visit date, provider name)  No medication changes indicated OR if recent visit, treatment plan here.  BP Readings from Last 3 Encounters:  08/26/20 135/72  08/21/20 128/82  03/31/20 132/80    Lab Results  Component Value Date   HGBA1C 6.1 03/31/2020     Patient obtains medications through Adherence Packaging  30 Days   Last adherence delivery included:  Klor-Con 10 meq 1 tab daily breakfast Atorvastatin 10 mg 1 tab daily bedtime Amlodipine 2.5 mg 1 tab daily breakfast Gabapentin 100 mg 1 cap daily breakfast eliquis 5 mg 1 tab breakfast and  bedtime Hydrochlorothiazide 12.5 mg 1 cap breakfast   Patient is due for next adherence delivery on: 09/09/20.  Called patient and reviewed medications and coordinated delivery. This delivery to include: Atorvastatin 10 mg 1 tab daily bedtime Amlodipine 2.5 mg 1 tab daily breakfast Gabapentin 100 mg 1 cap daily breakfast eliquis 5 mg 1 tab breakfast and bedtime Hydrochlorothiazide 12.5 mg 1 cap breakfast Escitalopram 10 mg 1 dab daily  Patient needs refills for none noted.  Confirmed delivery date of 09/09/20, advised patient that pharmacy will contact them the morning of delivery.   [name] Clinical Pharmacist Assistant [phone]   Time spent:20

## 2020-09-10 ENCOUNTER — Ambulatory Visit (INDEPENDENT_AMBULATORY_CARE_PROVIDER_SITE_OTHER): Payer: Medicare Other

## 2020-09-10 DIAGNOSIS — Z0000 Encounter for general adult medical examination without abnormal findings: Secondary | ICD-10-CM | POA: Diagnosis not present

## 2020-09-10 NOTE — Patient Instructions (Signed)
Jeff Lamb , Thank you for taking time to come for your Medicare Wellness Visit. I appreciate your ongoing commitment to your health goals. Please review the following plan we discussed and let me know if I can assist you in the future.   Screening recommendations/referrals: Colonoscopy: not a candidate for screening due to age Recommended yearly ophthalmology/optometry visit for glaucoma screening and checkup Recommended yearly dental visit for hygiene and checkup  Vaccinations: Influenza vaccine: declined Pneumococcal vaccine: 02/12/2008, 03/21/2014 Tdap vaccine: 09/24/2016; due every 10 years Shingles vaccine: never done; can check with local pharmacy ([facility])   Covid-19: 01/08/2019, 02/05/2019  Advanced directives: Please bring a copy of your health care power of attorney and living will to the office at your convenience.  Conditions/risks identified: Yes; Client understands the importance of follow-up with providers by attending scheduled visits and discussed goals to eat healthier, increase physical activity, exercise the brain, socialize more, get enough sleep and make time for laughter.  Next appointment: Please schedule your next Medicare Wellness Visit with your Nurse Health Advisor in 1 year by calling [phone].  Preventive Care 85 Years and Older, Male Preventive care refers to lifestyle choices and visits with your health care provider that can promote health and wellness. What does preventive care include? A yearly physical exam. This is also called an annual well check. Dental exams once or twice a year. Routine eye exams. Ask your health care provider how often you should have your eyes checked. Personal lifestyle choices, including: Daily care of your teeth and gums. Regular physical activity. Eating a healthy diet. Avoiding tobacco and drug use. Limiting alcohol use. Practicing safe sex. Taking low doses of aspirin every day. Taking vitamin and mineral supplements as  recommended by your health care provider. What happens during an annual well check? The services and screenings done by your health care provider during your annual well check will depend on your age, overall health, lifestyle risk factors, and family history of disease. Counseling  Your health care provider may ask you questions about your: Alcohol use. Tobacco use. Drug use. Emotional well-being. Home and relationship well-being. Sexual activity. Eating habits. History of falls. Memory and ability to understand (cognition). Work and work Astronomer. Screening  You may have the following tests or measurements: Height, weight, and BMI. Blood pressure. Lipid and cholesterol levels. These may be checked every 5 years, or more frequently if you are over 62 years old. Skin check. Lung cancer screening. You may have this screening every year starting at age 85 if you have a 30-pack-year history of smoking and currently smoke or have quit within the past 15 years. Fecal occult blood test (FOBT) of the stool. You may have this test every year starting at age 85. Flexible sigmoidoscopy or colonoscopy. You may have a sigmoidoscopy every 5 years or a colonoscopy every 10 years starting at age 85. Prostate cancer screening. Recommendations will vary depending on your family history and other risks. Hepatitis C blood test. Hepatitis B blood test. Sexually transmitted disease (STD) testing. Diabetes screening. This is done by checking your blood sugar (glucose) after you have not eaten for a while (fasting). You may have this done every 1-3 years. Abdominal aortic aneurysm (AAA) screening. You may need this if you are a current or former smoker. Osteoporosis. You may be screened starting at age 85 if you are at high risk. Talk with your health care provider about your test results, treatment options, and if necessary, the need for more tests.  Vaccines  Your health care provider may recommend  certain vaccines, such as: Influenza vaccine. This is recommended every year. Tetanus, diphtheria, and acellular pertussis (Tdap, Td) vaccine. You may need a Td booster every 10 years. Zoster vaccine. You may need this after age 85. Pneumococcal 13-valent conjugate (PCV13) vaccine. One dose is recommended after age 85. Pneumococcal polysaccharide (PPSV23) vaccine. One dose is recommended after age 85. Talk to your health care provider about which screenings and vaccines you need and how often you need them. This information is not intended to replace advice given to you by your health care provider. Make sure you discuss any questions you have with your health care provider. Document Released: 01/24/2015 Document Revised: 09/17/2015 Document Reviewed: 10/29/2014 Elsevier Interactive Patient Education  2017 [employer] Prevention in the Home Falls can cause injuries. They can happen to people of all ages. There are many things you can do to make your home safe and to help prevent falls. What can I do on the outside of my home? Regularly fix the edges of walkways and driveways and fix any cracks. Remove anything that might make you trip as you walk through a door, such as a raised step or threshold. Trim any bushes or trees on the path to your home. Use bright outdoor lighting. Clear any walking paths of anything that might make someone trip, such as rocks or tools. Regularly check to see if handrails are loose or broken. Make sure that both sides of any steps have handrails. Any raised decks and porches should have guardrails on the edges. Have any leaves, snow, or ice cleared regularly. Use sand or salt on walking paths during winter. Clean up any spills in your garage right away. This includes oil or grease spills. What can I do in the bathroom? Use night lights. Install grab bars by the toilet and in the tub and shower. Do not use towel bars as grab bars. Use non-skid mats or  decals in the tub or shower. If you need to sit down in the shower, use a plastic, non-slip stool. Keep the floor dry. Clean up any water that spills on the floor as soon as it happens. Remove soap buildup in the tub or shower regularly. Attach bath mats securely with double-sided non-slip rug tape. Do not have throw rugs and other things on the floor that can make you trip. What can I do in the bedroom? Use night lights. Make sure that you have a light by your bed that is easy to reach. Do not use any sheets or blankets that are too big for your bed. They should not hang down onto the floor. Have a firm chair that has side arms. You can use this for support while you get dressed. Do not have throw rugs and other things on the floor that can make you trip. What can I do in the kitchen? Clean up any spills right away. Avoid walking on wet floors. Keep items that you use a lot in easy-to-reach places. If you need to reach something above you, use a strong step stool that has a grab bar. Keep electrical cords out of the way. Do not use floor polish or wax that makes floors slippery. If you must use wax, use non-skid floor wax. Do not have throw rugs and other things on the floor that can make you trip. What can I do with my stairs? Do not leave any items on the stairs. Make sure that  there are handrails on both sides of the stairs and use them. Fix handrails that are broken or loose. Make sure that handrails are as long as the stairways. Check any carpeting to make sure that it is firmly attached to the stairs. Fix any carpet that is loose or worn. Avoid having throw rugs at the top or bottom of the stairs. If you do have throw rugs, attach them to the floor with carpet tape. Make sure that you have a light switch at the top of the stairs and the bottom of the stairs. If you do not have them, ask someone to add them for you. What else can I do to help prevent falls? Wear shoes that: Do not  have high heels. Have rubber bottoms. Are comfortable and fit you well. Are closed at the toe. Do not wear sandals. If you use a stepladder: Make sure that it is fully opened. Do not climb a closed stepladder. Make sure that both sides of the stepladder are locked into place. Ask someone to hold it for you, if possible. Clearly mark and make sure that you can see: Any grab bars or handrails. First and last steps. Where the edge of each step is. Use tools that help you move around (mobility aids) if they are needed. These include: Canes. Walkers. Scooters. Crutches. Turn on the lights when you go into a dark area. Replace any light bulbs as soon as they burn out. Set up your furniture so you have a clear path. Avoid moving your furniture around. If any of your floors are uneven, fix them. If there are any pets around you, be aware of where they are. Review your medicines with your doctor. Some medicines can make you feel dizzy. This can increase your chance of falling. Ask your doctor what other things that you can do to help prevent falls. This information is not intended to replace advice given to you by your health care provider. Make sure you discuss any questions you have with your health care provider. Document Released: 10/24/2008 Document Revised: 06/05/2015 Document Reviewed: 02/01/2014 Elsevier Interactive Patient Education  2017 [employer].

## 2020-09-10 NOTE — Progress Notes (Signed)
I connected with Jeff Lamb today by telephone and verified that I am speaking with the correct person using two identifiers. Location patient: home Location provider: work Persons participating in the virtual visit: patient, provider.   I discussed the limitations, risks, security and privacy concerns of performing an evaluation and management service by telephone and the availability of in person appointments. I also discussed with the patient that there may be a patient responsible charge related to this service. The patient expressed understanding and verbally consented to this telephonic visit.    Interactive audio and video telecommunications were attempted between this provider and patient, however failed, due to patient having technical difficulties OR patient did not have access to video capability.  We continued and completed visit with audio only.  Some vital signs may be absent or patient reported.   Time Spent with patient on telephone encounter: 30 minutes  Subjective:   Jeff Lamb is a 87 y.o. male who presents for Medicare Annual/Subsequent preventive examination.  Review of Systems     Cardiac Risk Factors include: advanced age (>26men, >36 women);dyslipidemia;family history of premature cardiovascular disease;hypertension;male gender     Objective:    Today's Vitals   09/10/20 1424  PainSc: 0-No pain   There is no height or weight on file to calculate BMI.  Advanced Directives 09/10/2020 12/01/2018 11/26/2018 03/20/2018 09/24/2016  Does Patient Have a Medical Advance Directive? Yes No No No No  Type of Advance Directive Living will;Healthcare Power of Attorney - - - -  Copy of Healthcare Power of Attorney in Chart? No - copy requested - - - -  Would patient like information on creating a medical advance directive? - - No - Patient declined No - Patient declined No - Patient declined    Current Medications (verified) Outpatient Encounter Medications as of  09/10/2020  Medication Sig   amLODipine (NORVASC) 2.5 MG tablet TAKE ONE TABLET BY MOUTH EVERY MORNING   atorvastatin (LIPITOR) 10 MG tablet Take 10 mg by mouth daily.   ELIQUIS 5 MG TABS tablet TAKE ONE TABLET BY MOUTH EVERY MORNING and TAKE ONE TABLET BY MOUTH EVERYDAY AT BEDTIME   escitalopram (LEXAPRO) 10 MG tablet Take 1 tablet (10 mg total) by mouth daily.   gabapentin (NEURONTIN) 100 MG capsule TAKE ONE CAPSULE BY MOUTH EVERYDAY AT BEDTIME   hydrochlorothiazide (MICROZIDE) 12.5 MG capsule TAKE ONE TABLET BY MOUTH EVERY MORNING   Multiple Vitamin (MULTIVITAMIN WITH MINERALS) TABS tablet Take 1 tablet by mouth every morning.   Naphazoline HCl (CLEAR EYES OP) Place 1 drop into both eyes daily as needed (itching/dry eyes).   potassium chloride (KLOR-CON) 10 MEQ tablet Take 10 mEq by mouth daily.   senna-docusate (SENOKOT-S) 8.6-50 MG tablet Take 1 tablet by mouth at bedtime.   No facility-administered encounter medications on file as of 09/10/2020.    Allergies (verified) Patient has no known allergies.   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 Married '57 -20/divorced. Married ''[id number] - 1 year /divorced. 1 son '62. 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 Lives alone. Has cousins in the area. Son is principle contact: Jeff CoddingJohn Q. Malter (c) [phone].               Social Determinants of Health   Financial Resource Strain: Low Risk    Difficulty of Paying Living Expenses: Not hard at all  Food Insecurity: No Food Insecurity   Worried About Programme researcher, broadcasting/film/videounning Out of Food in the Last Year: Never true   Ran Out of Food in the Last Year: Never true  Transportation Needs: No Transportation Needs   Lack of Transportation (Medical): No   Lack of Transportation (Non-Medical): No  Physical Activity: Sufficiently Active   Days of Exercise per Week: 5 days   Minutes of Exercise per Session: 30 min  Stress: No Stress Concern Present   Feeling of Stress : Not at all  Social Connections: Moderately Integrated   Frequency of Communication with Friends and Family: More than three times a week   Frequency of Social Gatherings with Friends and Family: Twice a week   Attends Religious Services: 1 to 4 times per year   Active Member of [employer] or Organizations: Yes   Attends BankerClub or Organization Meetings: 1 to 4 times per year   Marital Status: Divorced    Tobacco Counseling Counseling given: Not Answered   Clinical Intake:  Pre-visit preparation completed: Yes  Pain : No/denies pain Pain Score: 0-No pain     Nutritional Risks: None Diabetes: No  How often do you need to have someone help you when you read instructions,  pamphlets, or other written materials from your doctor or pharmacy?: 1 - Never What is the last grade level you completed in school?: High School Graduate  Diabetic? no  Interpreter Needed?: No  Information entered by :: Jeff CassetteShenika Hersh Minney, LPN   Activities of Daily Living In your present state of health, do you have any difficulty performing the following activities: 09/10/2020 03/31/2020  Hearing? Y N  Vision? Y N  Difficulty concentrating or making decisions? Y N  Walking or climbing stairs? Y N  Dressing or bathing? N N  Doing errands, shopping? Y N  Preparing Food and eating ? N -  Using the Toilet? N -  In the past six months, have you accidently leaked urine? N -  Do you have problems with loss of bowel control? N -  Managing your Finances? N -  Housekeeping or managing your Housekeeping? N -  Some recent data might be hidden    Patient Care Team: Jeff Broker, MD as PCP - General (Internal Medicine) Jeff Lamb, [facility] as Pharmacist (Pharmacist) Jeff Lamb as Consulting Physician (Optometry)  Indicate any recent Medical Services you may have received from other than [facility] providers in the past year (date may be approximate).     Assessment:   This is a routine wellness examination for Jeff Lamb.  Hearing/Vision screen Hearing Screening - Comments:: Patient wears hearing aids due to hearing loss. Vision Screening - Comments:: Patient wears glasses for small print.  Eye exam done by Jeff Lamb.  Dietary issues and exercise activities discussed: Current Exercise Habits: Home exercise routine, Type of exercise: stretching;walking, Time (Minutes): 30, Frequency (Times/Week): 5, Weekly Exercise (Minutes/Week): 150, Intensity: Mild, Exercise limited by: cardiac condition(s);orthopedic condition(s)   Goals Addressed               This Visit's Progress     Patient Stated (pt-stated)        My goal is to continue to remain independent and socially  active.      Depression Screen PHQ 2/9 Scores 09/10/2020 03/31/2020 12/21/2018 03/20/2018 03/16/2018 03/14/2017 10/13/2015  PHQ - 2 Score 0 0 0 0 0 0 0    Fall Risk Fall Risk  09/10/2020 03/31/2020 03/16/2018 03/14/2017 10/13/2015  Falls in the past year? 1 0 0 No No  Number falls in past yr: 1 0 - - -  Injury with Fall? 0 0 - - -    FALL RISK PREVENTION PERTAINING TO THE HOME:  Any stairs in or around the home? No  If so, are there any without handrails? No  Home free of loose throw rugs in walkways, pet beds, electrical cords, etc? Yes  Adequate lighting in your home to reduce risk of falls? Yes   ASSISTIVE DEVICES UTILIZED TO PREVENT FALLS:  Life alert? No  Use of a cane, walker or w/c? Yes  Grab bars in the bathroom? No  Shower chair or bench in shower? No  Elevated toilet seat or a handicapped toilet? No   TIMED UP AND GO:  Was the test performed? No .  Length of time to ambulate 10 feet: n/a sec.   Appearance of gait: patient stated that he sits in a wheelchair.  Cognitive Function: No flowsheet data found.         Immunizations Immunization History  Administered Date(s) Administered   DTaP 01/12/2008   Influenza Split 10/12/2010   Influenza, High Dose Seasonal PF 03/14/2017, 01/17/2018, 10/29/2018   Influenza,inj,Quad PF,6+ Mos 10/16/2013, 10/13/2015   Moderna Sars-Covid-2 Vaccination 01/08/2019, 02/05/2019   Pneumococcal Conjugate-13 02/12/2008   Pneumococcal Polysaccharide-23 03/21/2014   Tdap 09/24/2016    TDAP status: Up to date  Flu Vaccine status: Declined, Education has been provided regarding the importance of this vaccine but patient still declined. Advised may receive this vaccine at local pharmacy or Health Dept. Aware to provide a copy of the vaccination record if obtained from local pharmacy or Health Dept. Verbalized acceptance and understanding.  Pneumococcal vaccine status: Up to date  Covid-19 vaccine status: Completed vaccines  Qualifies for  Shingles Vaccine? Yes   Zostavax completed No   Shingrix Completed?: No.    Education has been provided regarding the importance of this vaccine. Patient has been advised to  call insurance company to determine out of pocket expense if they have not yet received this vaccine. Advised may also receive vaccine at local pharmacy or Health Dept. Verbalized acceptance and understanding.  Screening Tests Health Maintenance  Topic Date Due   Zoster Vaccines- Shingrix (1 of 2) Never done   COVID-19 Vaccine (3 - Booster for Moderna series) 07/06/2019   INFLUENZA VACCINE  08/11/2020   TETANUS/TDAP  09/25/2026   PNA vac Low Risk Adult  Completed   HPV VACCINES  Aged Out    Health Maintenance  Health Maintenance Due  Topic Date Due   Zoster Vaccines- Shingrix (1 of 2) Never done   COVID-19 Vaccine (3 - Booster for Moderna series) 07/06/2019   INFLUENZA VACCINE  08/11/2020    Colorectal cancer screening: No longer required.   Lung Cancer Screening: (Low Dose CT Chest recommended if Age 33-80 years, 30 pack-year currently smoking OR have quit w/in 15years.) does not qualify.   Lung Cancer Screening Referral: no  Additional Screening:  Hepatitis C Screening: does not qualify; Completed no  Vision Screening: Recommended annual ophthalmology exams for early detection of glaucoma and other disorders of the eye. Is the patient up to date with their annual eye exam?  Yes  Who is the provider or what is the name of the office in which the patient attends annual eye exams? Jeff Lamb, OD. If pt is not established with a provider, would they like to be referred to a provider to establish care? No .   Dental Screening: Recommended annual dental exams for proper oral hygiene  Community Resource Referral / Chronic Care Management: CRR required this visit?  No   CCM required this visit?  No      Plan:     I have personally reviewed and noted the following in the patient's chart:   Medical  and social history Use of alcohol, tobacco or illicit drugs  Current medications and supplements including opioid prescriptions. Patient is not currently taking opioid prescriptions. Functional ability and status Nutritional status Physical activity Advanced directives List of other physicians Hospitalizations, surgeries, and ER visits in previous 12 months Vitals Screenings to include cognitive, depression, and falls Referrals and appointments  In addition, I have reviewed and discussed with patient certain preventive protocols, quality metrics, and best practice recommendations. A written personalized care plan for preventive services as well as general preventive health recommendations were provided to patient.     Jeff Needy, LPN   [id number]   Nurse Notes:  Patient is cogitatively intact. There were no vitals filed for this visit. There is no height or weight on file to calculate BMI. Hearing Screening - Comments:: Patient wears hearing aids due to hearing loss. Vision Screening - Comments:: Patient wears glasses for small print.  Eye exam done by Jeff Lamb.

## 2020-10-01 ENCOUNTER — Telehealth: Payer: Self-pay | Admitting: Pharmacist

## 2020-10-01 NOTE — Progress Notes (Signed)
    Chronic Care Management Pharmacy Assistant   Name: Jeff Lamb  MRN: [id number] DOB: 1929/07/11   Reason for Encounter: Medication Review   Recent office visits:  None ID  Recent consult visits:  None ID  Hospital visits:  08/25/20 [facility] for fatigue, no meds changes  Medications: Outpatient Encounter Medications as of 10/01/2020  Medication Sig   amLODipine (NORVASC) 2.5 MG tablet TAKE ONE TABLET BY MOUTH EVERY MORNING   atorvastatin (LIPITOR) 10 MG tablet Take 10 mg by mouth daily.   ELIQUIS 5 MG TABS tablet TAKE ONE TABLET BY MOUTH EVERY MORNING and TAKE ONE TABLET BY MOUTH EVERYDAY AT BEDTIME   escitalopram (LEXAPRO) 10 MG tablet Take 1 tablet (10 mg total) by mouth daily.   gabapentin (NEURONTIN) 100 MG capsule TAKE ONE CAPSULE BY MOUTH EVERYDAY AT BEDTIME   hydrochlorothiazide (MICROZIDE) 12.5 MG capsule TAKE ONE TABLET BY MOUTH EVERY MORNING   Multiple Vitamin (MULTIVITAMIN WITH MINERALS) TABS tablet Take 1 tablet by mouth every morning.   Naphazoline HCl (CLEAR EYES OP) Place 1 drop into both eyes daily as needed (itching/dry eyes).   potassium chloride (KLOR-CON) 10 MEQ tablet Take 10 mEq by mouth daily.   senna-docusate (SENOKOT-S) 8.6-50 MG tablet Take 1 tablet by mouth at bedtime.   No facility-administered encounter medications on file as of 10/01/2020.   Reviewed chart for medication changes ahead of medication coordination call.  No OVs, Consults, or hospital visits since last care coordination call/Pharmacist visit. (If appropriate, list visit date, provider name)  No medication changes indicated OR if recent visit, treatment plan here.  BP Readings from Last 3 Encounters:  08/26/20 135/72  08/21/20 128/82  03/31/20 132/80    Lab Results  Component Value Date   HGBA1C 6.1 03/31/2020     Patient obtains medications through Adherence Packaging  30 Days   Last adherence delivery included:  Atorvastatin 10 mg 1 tab daily bedtime Amlodipine 2.5  mg 1 tab daily breakfast Gabapentin 100 mg 1 cap daily breakfast eliquis 5 mg 1 tab breakfast and bedtime Hydrochlorothiazide 12.5 mg 1 cap breakfast Escitalopram 10 mg 1 dab daily   Patient is due for next adherence delivery on: 10/09/20.  Called patient and reviewed medications and coordinated delivery. This delivery to include: Atorvastatin 10 mg 1 tab daily bedtime Amlodipine 2.5 mg 1 tab daily breakfast Gabapentin 100 mg 1 cap daily breakfast eliquis 5 mg 1 tab breakfast and bedtime Hydrochlorothiazide 12.5 mg 1 cap breakfast Escitalopram 10 mg 1 dab daily Mens multivitamin 1 tab daily  Patient needs refills for none noted.  Confirmed delivery date of 10/09/20, advised patient that pharmacy will contact them the morning of delivery.   [name] Clinical Pharmacist Assistant [phone]   Time spent:23

## 2020-10-07 ENCOUNTER — Telehealth: Payer: Self-pay | Admitting: Internal Medicine

## 2020-10-07 DIAGNOSIS — Z8673 Personal history of transient ischemic attack (TIA), and cerebral infarction without residual deficits: Secondary | ICD-10-CM

## 2020-10-07 NOTE — Telephone Encounter (Signed)
Patient last seen provider 08/21/20   Says they spoke w/ provider in regards to getting patient a new wheel chair but they have not heard anything since  Calling to check status  Please follow up

## 2020-10-08 NOTE — Telephone Encounter (Signed)
   Patients cousin Fanny Dance calling with patient  for status of wheelchair order Patient not currently doing PT  Advised caller to have patient complete updated DPR

## 2020-10-13 NOTE — Telephone Encounter (Signed)
Called patient. LVM asking him to give our office a call back to discuss his wheelchair. Office number was provided.

## 2020-10-14 ENCOUNTER — Telehealth: Payer: Self-pay | Admitting: Pharmacist

## 2020-10-14 NOTE — Telephone Encounter (Signed)
See below

## 2020-10-14 NOTE — Progress Notes (Signed)
    Chronic Care Management Pharmacy Assistant   Name: Jeff Lamb  MRN: [id number] DOB: [birth year]  Spoke with patient daughter who wanted to make sure we had patient correct address.[address], I told  her that we do have the correct address.  [name] Clinical Pharmacist Assistant [phone]

## 2020-10-14 NOTE — Telephone Encounter (Signed)
Please advise as the pt is inquiring about an electric wheelchair as discussed in prior office visit.  **Please advise as pt cousin [name] can be reached at [phone] to further discuss any questions or concerns Pertaining to pts wheelchair order.

## 2020-10-14 NOTE — Telephone Encounter (Signed)
I had ordered PT to do power wheelchair assessment at last visit in August. Did they ever schedule/do this? That is the first step, then a dedicated office visit with me is needed after I receive the assessment.

## 2020-10-15 NOTE — Telephone Encounter (Signed)
Spoke with the patient 's cousin and she stated that they would rather have a manual wheelchair. PT assessment was never done. Please advise.

## 2020-10-16 NOTE — Addendum Note (Signed)
Addended by: Hillard Danker A on: 10/16/2020 11:32 AM   Modules accepted: Orders

## 2020-10-16 NOTE — Telephone Encounter (Signed)
Order for manual done can you let [facility] know?

## 2020-10-22 ENCOUNTER — Telehealth: Payer: Self-pay | Admitting: Pharmacist

## 2020-10-22 NOTE — Progress Notes (Signed)
    Chronic Care Management Pharmacy Assistant   Name: Jeff Lamb  MRN: [id number] DOB: Sep 07, 1929  Spoke with patient and rescheduled his appointment with clinical pharmacist Al Corpus from 11/28/20 to 12/02/20 @ 2:45 pm.  [name] Clinical Pharmacist Assistant [phone]

## 2020-10-27 ENCOUNTER — Other Ambulatory Visit: Payer: Self-pay | Admitting: Internal Medicine

## 2020-10-27 DIAGNOSIS — F419 Anxiety disorder, unspecified: Secondary | ICD-10-CM

## 2020-10-28 ENCOUNTER — Telehealth: Payer: Self-pay | Admitting: Pharmacist

## 2020-10-28 NOTE — Progress Notes (Signed)
    Chronic Care Management Pharmacy Assistant   Name: BECKEM TOMBERLIN  MRN: [id number] DOB: December 20, 1929   Reason for Encounter: Medication Review    Recent office visits:  None ID  Recent consult visits:  None ID  Hospital visits:  None in previous 6 months  Medications: Outpatient Encounter Medications as of 10/28/2020  Medication Sig   amLODipine (NORVASC) 2.5 MG tablet TAKE ONE TABLET BY MOUTH EVERY MORNING   atorvastatin (LIPITOR) 10 MG tablet Take 10 mg by mouth daily.   ELIQUIS 5 MG TABS tablet TAKE ONE TABLET BY MOUTH EVERY MORNING and TAKE ONE TABLET BY MOUTH EVERYDAY AT BEDTIME   escitalopram (LEXAPRO) 10 MG tablet Take 1 tablet (10 mg total) by mouth daily.   gabapentin (NEURONTIN) 100 MG capsule TAKE ONE CAPSULE BY MOUTH EVERYDAY AT BEDTIME   hydrochlorothiazide (MICROZIDE) 12.5 MG capsule TAKE ONE TABLET BY MOUTH EVERY MORNING   Multiple Vitamin (MULTIVITAMIN WITH MINERALS) TABS tablet Take 1 tablet by mouth every morning.   Naphazoline HCl (CLEAR EYES OP) Place 1 drop into both eyes daily as needed (itching/dry eyes).   potassium chloride (KLOR-CON) 10 MEQ tablet Take 10 mEq by mouth daily.   senna-docusate (SENOKOT-S) 8.6-50 MG tablet Take 1 tablet by mouth at bedtime.   No facility-administered encounter medications on file as of 10/28/2020.    BP Readings from Last 3 Encounters:  08/26/20 135/72  08/21/20 128/82  03/31/20 132/80    Lab Results  Component Value Date   HGBA1C 6.1 03/31/2020      Last adherence delivery date:10/06/20      Patient is due for next adherence delivery on: 11/07/20  Spoke with patient on 10/28/20 reviewed medications and coordinated delivery.  This delivery to include: Adherence Packaging  30 Days  Atorvastatin 10 mg 1 tab daily bedtime Amlodipine 2.5 mg 1 tab daily breakfast Gabapentin 100 mg 1 cap daily breakfast eliquis 5 mg 1 tab breakfast and bedtime Hydrochlorothiazide 12.5 mg 1 cap breakfast Escitalopram 10 mg 1  tab breakfast Mens multivitamin 1 tab Breakfast Klor-Con 10 meq 1 tab at breakfast   Patient declined the following medications this month:   Any concerns about your medications? No  How often do you forget or accidentally miss a dose? Rarely  Do you use a pillbox? Yes  Is patient in packaging Yes  If yes  What is the date on your next pill pack?  Any concerns or issues with your packaging?none   Refills requested from providers include:escitalopram-cpp to request  Confirmed delivery date of 11/07/20, advised patient that pharmacy will contact them the morning of delivery.  Recent blood pressure readings are as follows:NA    Annual wellness visit in last year? No Most Recent BP reading:NA  Star Rating Drugs: Atorvastatin 10 mg las fill 10/06/20   [name] Clinical Pharmacist Assistant [phone]

## 2020-11-17 NOTE — Telephone Encounter (Signed)
Patient relative Fanny Dance calling in  Says no one has contacted them regarding patient manual wheelchair since 10/05  Wants to know what is the current status  Please call [phone]

## 2020-11-19 NOTE — Telephone Encounter (Signed)
Contacted [facility] and they should be reaching out to the patient.   LVM letting the pt's cousin know about the update. Office number was provided.

## 2020-11-26 ENCOUNTER — Other Ambulatory Visit: Payer: Self-pay | Admitting: Internal Medicine

## 2020-11-28 ENCOUNTER — Telehealth: Payer: Self-pay | Admitting: Pharmacist

## 2020-11-28 ENCOUNTER — Telehealth: Payer: Medicare Other

## 2020-11-28 NOTE — Progress Notes (Signed)
    Chronic Care Management Pharmacy Assistant   Name: Jeff Lamb  MRN: [id number] DOB: 1929/10/01  Reason for Encounter: Medication Review    Recent office visits:  None ID  Recent consult visits:  None ID  Hospital visits:  None in previous 6 months  Medications: Outpatient Encounter Medications as of 11/28/2020  Medication Sig   amLODipine (NORVASC) 2.5 MG tablet TAKE ONE TABLET BY MOUTH EVERY MORNING   atorvastatin (LIPITOR) 10 MG tablet Take 10 mg by mouth daily.   ELIQUIS 5 MG TABS tablet TAKE ONE TABLET BY MOUTH EVERY MORNING and TAKE ONE TABLET BY MOUTH EVERYDAY AT BEDTIME   escitalopram (LEXAPRO) 10 MG tablet TAKE ONE TABLET BY MOUTH ONCE DAILY   gabapentin (NEURONTIN) 100 MG capsule TAKE ONE CAPSULE BY MOUTH EVERYDAY AT BEDTIME   hydrochlorothiazide (MICROZIDE) 12.5 MG capsule TAKE ONE TABLET BY MOUTH EVERY MORNING   Multiple Vitamin (MULTIVITAMIN WITH MINERALS) TABS tablet Take 1 tablet by mouth every morning.   Naphazoline HCl (CLEAR EYES OP) Place 1 drop into both eyes daily as needed (itching/dry eyes).   potassium chloride (KLOR-CON) 10 MEQ tablet Take 10 mEq by mouth daily.   senna-docusate (SENOKOT-S) 8.6-50 MG tablet Take 1 tablet by mouth at bedtime.   No facility-administered encounter medications on file as of 11/28/2020.   Reviewed chart for medication changes ahead of medication coordination call.  No OVs, Consults, or hospital visits since last care coordination call/Pharmacist visit. (If appropriate, list visit date, provider name)  No medication changes indicated OR if recent visit, treatment plan here.  BP Readings from Last 3 Encounters:  08/26/20 135/72  08/21/20 128/82  03/31/20 132/80    Lab Results  Component Value Date   HGBA1C 6.1 03/31/2020     Patient obtains medications through Adherence Packaging  30 Days   Last adherence delivery included:  Atorvastatin 10 mg 1 tab daily bedtime Amlodipine 2.5 mg 1 tab daily  breakfast Gabapentin 100 mg 1 cap daily breakfast eliquis 5 mg 1 tab breakfast and bedtime Hydrochlorothiazide 12.5 mg 1 cap breakfast Escitalopram 10 mg 1 tab breakfast Mens multivitamin 1 tab Breakfast Klor-Con 10 meq 1 tab at breakfast   Patient is due for next adherence delivery on: 12/09/20 . Called patient and reviewed medications and coordinated delivery. This delivery to include: Atorvastatin 10 mg 1 tab daily bedtime Amlodipine 2.5 mg 1 tab daily breakfast Gabapentin 100 mg 1 cap daily breakfast eliquis 5 mg 1 tab breakfast and bedtime Hydrochlorothiazide 12.5 mg 1 cap breakfast Escitalopram 10 mg 1 tab breakfast Mens multivitamin 1 tab Breakfast Klor-Con 10 meq 1 tab at breakfast  Patient needs refills for hydrochlorothiazide, gabapentin and atorvastatin.  Confirmed delivery date of 12/09/20, advised patient that pharmacy will contact them the morning of delivery.    Star Rating Drugs: Atorvastatin 10 mg-last fill 10/31/20  [name] Clinical Pharmacist Assistant [phone]

## 2020-12-02 ENCOUNTER — Telehealth: Payer: Medicare Other

## 2020-12-08 ENCOUNTER — Telehealth: Payer: Self-pay | Admitting: Internal Medicine

## 2020-12-08 NOTE — Telephone Encounter (Signed)
Ok to refill? Medication was not prescribed by you

## 2020-12-08 NOTE — Telephone Encounter (Signed)
Pharmacy requesting a new rx for atorvastatin (LIPITOR) 10 MG tablet   Pharmacy states they do not have rx on file for patient

## 2020-12-09 MED ORDER — ATORVASTATIN CALCIUM 10 MG PO TABS: 10.0000 mg | ORAL_TABLET | Freq: Every day | ORAL | 3 refills | Status: DC

## 2020-12-09 NOTE — Telephone Encounter (Signed)
Refill has been sent to the patient's pharmacy.  

## 2020-12-09 NOTE — Telephone Encounter (Signed)
Ok to fill atorvastatin 10 mg daily #90 with 3 refills please.

## 2020-12-10 NOTE — Telephone Encounter (Signed)
Patient's relative Jeff Lamb calling to check staus of wheelchair  Informed caller a vm was left on 11-9, *see below* caller states she did not receive a vm and is requesting a call back to discuss update on wheelchair request  Please call [phone]

## 2020-12-12 NOTE — Telephone Encounter (Signed)
Please contact the patient's niece, Fanny Dance and let her know if her orders for a manual wheelchair Patient has been waiting for two months, and no one has followed up. Order showing entered on 10.6.22, but not sure if [facility] received it, patient has not received a call  His current wheelchair is broken  Please advise.

## 2020-12-12 NOTE — Telephone Encounter (Signed)
Spoke with Jeff Lamb to let her know that I have re-faxed everything from 10.6.2022 when the order was first placed including office notes. No other questions or concerns at this time.

## 2020-12-12 NOTE — Telephone Encounter (Signed)
Jeff Lamb called re: Jeff Lamb She spoke to April at [facility] [phone]) [facility] has been waiting on the F2F Narrative and office notes  Please follow-up with the patient and [facility]

## 2020-12-17 ENCOUNTER — Other Ambulatory Visit: Payer: Self-pay

## 2020-12-17 ENCOUNTER — Ambulatory Visit: Payer: Medicare Other

## 2020-12-17 DIAGNOSIS — Z8673 Personal history of transient ischemic attack (TIA), and cerebral infarction without residual deficits: Secondary | ICD-10-CM

## 2020-12-17 DIAGNOSIS — I10 Essential (primary) hypertension: Secondary | ICD-10-CM

## 2020-12-17 NOTE — Progress Notes (Signed)
Chronic Care Management Pharmacy Note  12/17/2020 Name:  Jeff Lamb MRN:  [id number] DOB:  07-25-1929   Summary: -Patient denies any recent medication changes, notes that he is still dealing with leg pain / right shoulder pain - has not yet established with physical therapy - questioning if home PT is an option as it is difficult for him to leave his home as he does not drive  -Denies any issues or concerns with any of his medications, has not been able to check blood pressure as he no longer has any BP cuffs in his home, but he denies any feelings of hypotension / hypertension  Recommendations/Changes made from today's visit: -Recommending no changes to medications at this time, will reach out to find out about possible home PT referral to help with leg/ shoulder pains   Plan: -F/u in 6 months    Subjective: Jeff Lamb is an 87 y.o. year old male who is a primary patient of Hoyt Koch, MD.  The CCM team was consulted for assistance with disease management and care coordination needs.    Engaged with patient by telephone for follow up visit in response to provider referral for pharmacy case management and/or care coordination services.   Consent to Services:  The patient was given information about Chronic Care Management services, agreed to services, and gave verbal consent prior to initiation of services.  Please see initial visit note for detailed documentation.   Patient Care Team: Hoyt Koch, MD as PCP - General (Internal Medicine) Charlton Haws, [facility] as Pharmacist (Pharmacist) Juluis Rainier as Consulting Physician (Optometry)   Patient lives at home alone. His cousin Jeff Lamb helps him often, she is accompanying him in the visit today. He is able to cook for himself but it is difficult. He would like someone to help with housekeeping, especially laundry and changing the bedding.  Pt is in a wheelchair. He is able to walk but feels unsteady on his  feet. He reports it is not easy to move around his home in a wheelchair especially over thresholds. He would like to get a new wheelchair since his current one has fabric torn on the arm. He would also like a new walker with wheels /  Tennis balls.  He would like help with applying for SCAT.   Recent office visits: 08/21/2020 - Dr. Sharlet Salina - referred to PT due to left sided weakness from previous stroke   Recent consult visits: None since last visit   Hospital visits: To ED 08/25/2020 discharged 08/26/2020 - for fall out of his wheelchair / slurred speech - CT completed - no acute abnormalities - final diagnosis = fatigue - no changes to medications   Objective:  Lab Results  Component Value Date   CREATININE 1.04 08/25/2020   BUN 13 08/25/2020   GFR 56.04 (L) 03/31/2020   GFRNONAA >60 08/25/2020   GFRAA >60 12/01/2018   NA 138 08/25/2020   K 4.7 08/25/2020   CALCIUM 9.3 08/25/2020   CO2 28 08/25/2020    Lab Results  Component Value Date/Time   HGBA1C 6.1 03/31/2020 02:56 PM   HGBA1C 6.4 (H) 11/26/2018 03:53 AM   GFR 56.04 (L) 03/31/2020 02:56 PM   GFR 66.91 01/13/2016 01:20 PM    Last diabetic Eye exam: No results found for: HMDIABEYEEXA  Last diabetic Foot exam: No results found for: HMDIABFOOTEX   Lab Results  Component Value Date   CHOL 110 03/31/2020   HDL 65.10  03/31/2020   LDLCALC 36 03/31/2020   TRIG 40.0 03/31/2020   CHOLHDL 2 03/31/2020    Hepatic Function Latest Ref Rng & Units 08/25/2020 03/31/2020 12/01/2018  Total Protein 6.5 - 8.1 g/dL 6.5 7.0 4.6(L)  Albumin 3.5 - 5.0 g/dL 3.5 4.1 2.2(L)  AST 15 - 41 U/L $Remo'26 21 21  'BiZIq$ ALT 0 - 44 U/L $Remo'20 21 20  'mirts$ Alk Phosphatase 38 - 126 U/L 55 48 41  Total Bilirubin 0.3 - 1.2 mg/dL 1.6(H) 1.0 1.5(H)    No results found for: TSH, FREET4  CBC Latest Ref Rng & Units 08/25/2020 03/31/2020 12/01/2018  WBC 4.0 - 10.5 K/uL 5.9 5.8 8.5  Hemoglobin 13.0 - 17.0 g/dL 14.4 13.9 16.4  Hematocrit 39.0 - 52.0 % 45.6 42.5 50.5   Platelets 150 - 400 K/uL 180 180.0 214    No results found for: VD25OH  Clinical ASCVD: Yes  The ASCVD Risk score (Arnett DK, et al., 2019) failed to calculate for the following reasons:   The 2019 ASCVD risk score is only valid for ages 58 to 55   The patient has a prior MI or stroke diagnosis    Depression screen [facility] 2/9 09/10/2020 03/31/2020 12/21/2018  Decreased Interest 0 0 0  Down, Depressed, Hopeless 0 0 0  PHQ - 2 Score 0 0 0     No flowsheet data found.   Social History   Tobacco Use  Smoking Status Former   Packs/day: 5.00   Years: 20.00   Pack years: 100.00   Types: Cigars, Cigarettes   Quit date: 12/22/1980   Years since quitting: 40.0  Smokeless Tobacco Never   BP Readings from Last 3 Encounters:  08/26/20 135/72  08/21/20 128/82  03/31/20 132/80   Pulse Readings from Last 3 Encounters:  08/26/20 85  08/21/20 67  03/31/20 87   Wt Readings from Last 3 Encounters:  03/31/20 160 lb 6.4 oz (72.8 kg)  11/14/19 166 lb (75.3 kg)  10/08/19 166 lb (75.3 kg)    Assessment/Interventions: Review of patient past medical history, allergies, medications, health status, including review of consultants reports, laboratory and other test data, was performed as part of comprehensive evaluation and provision of chronic care management services.    SDOH:  (Social Determinants of Health) assessments and interventions performed: Yes   CCM Care Plan  No Known Allergies  Medications Reviewed Today     Reviewed by Tomasa Blase, [facility] (Pharmacist) on 12/17/20 at 1339  Med List Status: <None>   Medication Order Taking? Sig Documenting Provider Last Dose Status Informant  acetaminophen (TYLENOL) 325 MG tablet [id number] Yes Take 325-650 mg by mouth every 6 (six) hours as needed. [provider]  Active   amLODipine (NORVASC) 2.5 MG tablet [id number] Yes TAKE ONE TABLET BY MOUTH EVERY MORNING Hoyt Koch, MD Taking Active   atorvastatin (LIPITOR) 10 MG  tablet [id number] Yes Take 1 tablet (10 mg total) by mouth daily. Hoyt Koch, MD Taking Active   ELIQUIS 5 MG TABS tablet [id number] Yes TAKE ONE TABLET BY MOUTH EVERY MORNING and TAKE ONE TABLET BY MOUTH EVERYDAY AT BEDTIME Hoyt Koch, MD Taking Active   escitalopram (LEXAPRO) 10 MG tablet [id number] Yes TAKE ONE TABLET BY MOUTH ONCE DAILY Hoyt Koch, MD Taking Active   gabapentin (NEURONTIN) 100 MG capsule [id number] Yes TAKE ONE CAPSULE BY MOUTH EVERYDAY AT BEDTIME Hoyt Koch, MD Taking Active   hydrochlorothiazide (MICROZIDE) 12.5 MG capsule [id number] Yes TAKE ONE TABLET BY  MOUTH EVERY MORNING Hoyt Koch, MD Taking Active   Multiple Vitamin (MULTIVITAMIN WITH MINERALS) TABS tablet [id number] Yes Take 1 tablet by mouth every morning. [provider] Taking Active Self  Naphazoline HCl (CLEAR EYES OP) [id number] Yes Place 1 drop into both eyes daily as needed (itching/dry eyes). [provider] Taking Active Self  potassium chloride (KLOR-CON) 10 MEQ tablet [id number] Yes Take 10 mEq by mouth daily. [provider] Taking Active   senna-docusate (SENOKOT-S) 8.6-50 MG tablet [id number]  Take 1 tablet by mouth at bedtime. Hoyt Koch, MD  Active             Patient Active Problem List   Diagnosis Date Noted   Left knee pain 09/27/2019   Left arm pain 05/24/2019   Pulmonary embolism ([place]) 11/26/2018   Prediabetes 11/26/2018   Right shoulder pain 01/17/2018   Ventral hernia without obstruction or gangrene 03/14/2017   History of stroke 07/12/2013   Hyperlipidemia 07/12/2013   Erectile dysfunction 08/02/2011   Routine health maintenance 12/24/2010   Hypertension     Immunization History  Administered Date(s) Administered   DTaP 01/12/2008   Influenza Split 10/12/2010   Influenza, High Dose Seasonal PF 03/14/2017, 01/17/2018, 10/29/2018   Influenza,inj,Quad PF,6+ Mos 10/16/2013, 10/13/2015   Moderna  Sars-Covid-2 Vaccination 01/08/2019, 02/05/2019   Pneumococcal Conjugate-13 02/12/2008   Pneumococcal Polysaccharide-23 03/21/2014   Tdap 09/24/2016    Conditions to be addressed/monitored:  Hypertension and Hyperlipidemia, hx stroke, hx PE  Patient Care Plan: CCM Pharmacy Care Plan     Problem Identified: Hypertension and Hyperlipidemia, hx stroke, hx PE   Priority: High     Long-Range Goal: Disease management   Start Date: 02/28/2020  Expected End Date: 08/27/2020  Recent Progress: On track  Priority: High  Note:   Current Barriers:  Unable to independently monitor therapeutic efficacy  Pharmacist Clinical Goal(s):  Patient will achieve adherence to monitoring guidelines and medication adherence to achieve therapeutic efficacy achieve ability to self administer medications as prescribed through use of pill packs as evidenced by patient report through collaboration with PharmD and provider.   Interventions: 1:1 collaboration with Hoyt Koch, MD regarding development and update of comprehensive plan of care as evidenced by provider attestation and co-signature Inter-disciplinary care team collaboration (see longitudinal plan of care) Comprehensive medication review performed; medication list updated in electronic medical record  Hypertension (BP goal <140/90) -Controlled - pt's caregiver reports BP at home is at goal; home health reported 1 episode of low BP 90s/60s in April but patient was asymptomatic BP Readings from Last 3 Encounters:  08/26/20 135/72  08/21/20 128/82  03/31/20 132/80  -Current treatment: Amlodipine 2.5 mg daily  HCTZ 12.5 mg daily -Denies hypotensive/hypertensive symptoms -Educated on BP goals and benefits of medications for prevention of heart attack, stroke and kidney damage; -Recommended to continue current medication  Hyperlipidemia: (LDL goal < 70) -Hx stroke 2015, 2020 Lab Results  Component Value Date   [place] 36 03/31/2020   -Controlled; pt complains of pain/aches in R arm and leg that get have gotten worse with inactivity - pt has been on statin for years without issue, this pain has been happening for a few months; it is unlikely that these pains are statin-related -Current treatment: Atorvastatin 10 mg daily -Educated on Cholesterol goals;  Benefits of statin for ASCVD risk reduction; -Recommended to continue current medication  Hx of PE (Goal: prevent subsequent VTE) -Controlled - pt reports compliance with Eliqius; does not endorse bleeding  issues -PE/stroke Nov 2020, [facility] from [facility]. -Current treatment  Eliquis 5 mg BID -Assessed patient finances. Eliquis is difficult to afford in [place], may consider PAP later in the year -Recommended to continue current medication  Muscle pain (Goal: manage pain) -Improving - pain improves with physical therapy/activity; pt completed round of PT a few weeks ago -Current treatment  Gabapentin 100 mg HS Tylenol 325 mg PRN -Recommended to continue current medication -Recommended to increase activity/movement throughout the day; may consider PT again  Health Maintenance -Vaccine gaps: covid booster -Current therapy:  Multivitamin Clear Eyes drops Klor-con 10 mEq daily Senna-docusate HS -Patient is satisfied with current therapy and denies issues -Pt is now receiving medications via [facility] pill packs and reports this is much easier than dealing with bottles -Recommended to continue current medication  Patient Goals/Self-Care Activities Patient will:  - take medications as prescribed -focus on medication adherence by pill packs -collaborate with provider on medication access solutions (Eliquis in [place]ut [place]) -Contact PCP office if more home health assistance is needed  Follow Up Plan: Telephone follow up appointment with care management team member scheduled for: 6 months     Medication Assistance: None required.  Patient affirms current  coverage meets needs.  Patient's preferred pharmacy is:  [facility] - [place], [place] - [address] [place] [address] Phone: [phone] Fax: [phone]  Uses pill box? Yes Pt endorses 80% compliance  We discussed: Reviewed patient's [facility] medication and Epic medication profile assuring there are no discrepancies or gaps in therapy. Confirmed all fill dates appropriate and verified with patient that there is a sufficient quantity of all prescribed medications at home. Informed patient to call me any time if needing medications before scheduled deliveries.   Patient decided to: Utilize [facility] pharmacy for medication synchronization, packaging and delivery  Care Plan and Follow Up Patient Decision:  Patient agrees to Care Plan and Follow-up.  Plan: Telephone follow up appointment with care management team member scheduled for:  6 months  Tomasa Blase, PharmD Clinical Pharmacist, [place]

## 2020-12-17 NOTE — Patient Instructions (Signed)
Visit Information  Following are the goals we discussed today:   Manage My Medications   imeframe:  Long-Range Goal Priority:  High Start Date:       02/28/20                      Expected End Date: 12/25/21                   Follow Up Date 06/17/2021   - call for medicine refill 2 or 3 days before it runs out - call if I am sick and can't take my medicine - keep a list of all the medicines I take; vitamins and herbals too  -Utilize [facility] pharmacy for medication synchronization, packaging and delivery -Contact PCP office if more home health is needed   Why is this important?   These steps will help you keep on track with your medicines.  Plan: Telephone follow up appointment with care management team member scheduled for:  6 months  The patient has been provided with contact information for the care management team and has been advised to call with any health related questions or concerns.   Ellin Saba, PharmD Clinical Pharmacist, [facility]   Please call the care guide team at [phone] if you need to cancel or reschedule your appointment.   The patient verbalized understanding of instructions, educational materials, and care plan provided today and declined offer to receive copy of patient instructions, educational materials, and care plan.

## 2020-12-24 DIAGNOSIS — I639 Cerebral infarction, unspecified: Secondary | ICD-10-CM | POA: Diagnosis not present

## 2020-12-24 DIAGNOSIS — G8190 Hemiplegia, unspecified affecting unspecified side: Secondary | ICD-10-CM | POA: Diagnosis not present

## 2021-01-02 ENCOUNTER — Other Ambulatory Visit: Payer: Self-pay | Admitting: Internal Medicine

## 2021-01-24 DIAGNOSIS — G8190 Hemiplegia, unspecified affecting unspecified side: Secondary | ICD-10-CM | POA: Diagnosis not present

## 2021-01-24 DIAGNOSIS — I639 Cerebral infarction, unspecified: Secondary | ICD-10-CM | POA: Diagnosis not present

## 2021-01-25 ENCOUNTER — Other Ambulatory Visit: Payer: Self-pay | Admitting: Internal Medicine

## 2021-02-03 ENCOUNTER — Other Ambulatory Visit: Payer: Self-pay | Admitting: Internal Medicine

## 2021-02-03 DIAGNOSIS — F419 Anxiety disorder, unspecified: Secondary | ICD-10-CM

## 2021-04-20 ENCOUNTER — Ambulatory Visit: Payer: Medicare Other | Admitting: Internal Medicine

## 2021-04-23 ENCOUNTER — Encounter: Payer: Self-pay | Admitting: Internal Medicine

## 2021-04-23 ENCOUNTER — Ambulatory Visit (INDEPENDENT_AMBULATORY_CARE_PROVIDER_SITE_OTHER): Payer: Medicare HMO | Admitting: Internal Medicine

## 2021-04-23 VITALS — BP 120/78 | HR 54 | Temp 98.2°F | Ht 70.0 in

## 2021-04-23 DIAGNOSIS — H905 Unspecified sensorineural hearing loss: Secondary | ICD-10-CM

## 2021-04-23 DIAGNOSIS — R609 Edema, unspecified: Secondary | ICD-10-CM

## 2021-04-23 DIAGNOSIS — M7031 Other bursitis of elbow, right elbow: Secondary | ICD-10-CM | POA: Diagnosis not present

## 2021-04-23 DIAGNOSIS — M79602 Pain in left arm: Secondary | ICD-10-CM | POA: Diagnosis not present

## 2021-04-23 DIAGNOSIS — I2782 Chronic pulmonary embolism: Secondary | ICD-10-CM

## 2021-04-23 DIAGNOSIS — Z8673 Personal history of transient ischemic attack (TIA), and cerebral infarction without residual deficits: Secondary | ICD-10-CM

## 2021-04-23 DIAGNOSIS — R269 Unspecified abnormalities of gait and mobility: Secondary | ICD-10-CM | POA: Insufficient documentation

## 2021-04-23 MED ORDER — HYDROCHLOROTHIAZIDE 25 MG PO TABS: 25.0000 mg | ORAL_TABLET | Freq: Every day | ORAL | 1 refills | Status: DC

## 2021-04-23 NOTE — Patient Instructions (Addendum)
Ok to increased HCT fluid pill to 25 mg per day ? ?You will be contacted regarding the referral for: audiology (though you can also go to [employer] for an OTC hearing aid) ? ?You will be contacted regarding the referral for: [facility] with RN, PT and aide ? ?Please continue all other medications as before, and refills have been done if requested. ? ?Please have the pharmacy call with any other refills you may need. ? ?Please keep your appointments with your specialists as you may have planned ? ? ? ?

## 2021-04-23 NOTE — Progress Notes (Signed)
Patient ID: Jeff Lamb, male   DOB: Jun 18, 1929, 88 y.o.   MRN: [id number] ? ? ? ?    Chief Complaint: follow up leg sweling, right elbow pain, left hearing loss, left arm spasms ? ?     HPI:  Jeff CoddingJohn Q Lamb is a 88 y.o. male here with family in wheelchair, c/o worsening leg swelling bialteral, spends all day sitting with legs dependent, unable to wear compression stockings, good compliance with hct 12.5 and Pt denies chest pain, increased sob or doe, wheezing, orthopnea, PND, palpitations, dizziness or syncope.  Does have spasms of the LUE but overall mild, does not want med such as baclofen for now.  Does also sit mostly with right elbow tip on the arm rest, now with 1 wk onset tender swelling without other trauma, fever or drainage.  Also has mild worsening left hearing loss in the past several months, may need hearing aid it seems.  Pt denies polydipsia, polyuria, or new focal neuro s/s.    Pt denies fever, wt loss, night sweats, loss of appetite, or other constitutional symptoms  [facility] ?      ?Wt Readings from Last 3 Encounters:  ?03/31/20 160 lb 6.4 oz (72.8 kg)  ?11/14/19 166 lb (75.3 kg)  ?10/08/19 166 lb (75.3 kg)  ? ?BP Readings from Last 3 Encounters:  ?04/23/21 120/78  ?08/26/20 135/72  ?08/21/20 128/82  ? ?      ?Past Medical History:  ?Diagnosis Date  ? Biceps tendonitis 12/24/2010  ? Hearing loss of both ears   ? bilateral due to decibel damage [place]  ? Hypertension   ? ?Past Surgical History:  ?Procedure Laterality Date  ? APPENDECTOMY    ? 31  ? CATARACT EXTRACTION W/ INTRAOCULAR LENS IMPLANT  October '12  ? left.  ? ? reports that he quit smoking about 40 years ago. His smoking use included cigars and cigarettes. He has a 100.00 pack-year smoking history. He has never used smokeless tobacco. He reports current alcohol use. He reports that he does not use drugs. ?family history includes Cancer in his maternal uncle and maternal uncle; Hypertension in his mother; Stroke in  his mother. ?No Known Allergies ?Current Outpatient Medications on File Prior to Visit  ?Medication Sig Dispense Refill  ? acetaminophen (TYLENOL) 325 MG tablet Take 325-650 mg by mouth every 6 (six) hours as needed.    ? amLODipine (NORVASC) 2.5 MG tablet TAKE ONE TABLET BY MOUTH EVERY MORNING 90 tablet 1  ? atorvastatin (LIPITOR) 10 MG tablet Take 1 tablet (10 mg total) by mouth daily. 90 tablet 3  ? ELIQUIS 5 MG TABS tablet TAKE ONE TABLET BY MOUTH AT BREAKFAST AND AT BEDTIME 240 tablet 1  ? escitalopram (LEXAPRO) 10 MG tablet TAKE ONE TABLET BY MOUTH ONCE DAILY 90 tablet 0  ? gabapentin (NEURONTIN) 100 MG capsule TAKE ONE CAPSULE BY MOUTH EVERYDAY AT BEDTIME 90 capsule 1  ? Multiple Vitamin (MULTIVITAMIN WITH MINERALS) TABS tablet Take 1 tablet by mouth every morning.    ? Naphazoline HCl (CLEAR EYES OP) Place 1 drop into both eyes daily as needed (itching/dry eyes).    ? potassium chloride (KLOR-CON) 10 MEQ tablet Take 10 mEq by mouth daily.    ? senna-docusate (SENOKOT-S) 8.6-50 MG tablet Take 1 tablet by mouth at bedtime. 90 tablet 3  ? ?No current facility-administered medications on file prior to visit.  ? ?     ROS:  All others  reviewed and negative. ? ?Objective  ? ?     PE:  BP 120/78 (BP Location: Right Arm, Patient Position: Sitting, Cuff Size: Large)   Pulse (!) 54   Temp 98.2 ?F (36.8 ?C) (Oral)   Ht 5\' 10"  (1.778 m)   SpO2 96%   BMI 23.02 kg/m?  ? ?              Constitutional: Pt appears in NAD ?              HENT: Head: NCAT.  ?              Right Ear: External ear normal.   ?              Left Ear: External ear normal.  ?              Eyes: . Pupils are equal, round, and reactive to light. Conjunctivae and EOM are normal ?              Nose: without d/c or deformity ?              Neck: Neck supple. Gross normal ROM ?              Cardiovascular: Normal rate and regular rhythm.   ?              Pulmonary/Chest: Effort normal and breath sounds without rales or wheezing.  ?              Right  elbow with 1-2+ bursa tender swelling  ?              Neurological: Pt is alert. At baseline orientation, motor grossly intact ?              Skin: Skin is warm. No rashes, no other new lesions, LE edema - 1+ bilateral ?              Psychiatric: Pt behavior is normal without agitation  ? ?Micro: none ? ?Cardiac tracings I have personally interpreted today:  none ? ?Pertinent Radiological findings (summarize): none  ? ?Lab Results  ?Component Value Date  ? WBC 5.9 08/25/2020  ? HGB 14.4 08/25/2020  ? HCT 45.6 08/25/2020  ? PLT 180 08/25/2020  ? GLUCOSE 89 08/25/2020  ? CHOL 110 03/31/2020  ? TRIG 40.0 03/31/2020  ? HDL 65.10 03/31/2020  ? LDLCALC 36 03/31/2020  ? ALT 20 08/25/2020  ? AST 26 08/25/2020  ? NA 138 08/25/2020  ? K 4.7 08/25/2020  ? CL 99 08/25/2020  ? CREATININE 1.04 08/25/2020  ? BUN 13 08/25/2020  ? CO2 28 08/25/2020  ? INR 1.1 11/26/2018  ? HGBA1C 6.1 03/31/2020  ? ?Assessment/Plan:  ?Jeff Lamb is a 88 y.o. Black or African American [2] male with  has a past medical history of Biceps tendonitis (12/24/2010), Hearing loss of both ears, and Hypertension. ? ?History of stroke ?Also for [facility] with RN, PT, aide ? ?Pulmonary embolism (HCC) ?Also part of referral for [facility] ? ?Gait disorder ?For PT with [facility] referral ? ?Left arm pain ?With spastic movement involuntary - declines baclofen for now or neurology referral ? ?Bursitis of right elbow ?Mild to mod, to avoid pressure to the elbow and will improved with conservative tx,, declines other specific tx ? ?Sensorineural hearing loss (SNHL) of left ear ?Worsening, for audiology referral ? ?Dependent edema ?Ok for increased hct 25 qd ? ?Followup: Return if symptoms  worsen or fail to improve. ? ?Oliver Barre, MD 04/28/2021 8:52 PM ?[facility] ?[facility] ?Internal Medicine ?

## 2021-04-28 ENCOUNTER — Encounter: Payer: Self-pay | Admitting: Internal Medicine

## 2021-04-28 DIAGNOSIS — M7031 Other bursitis of elbow, right elbow: Secondary | ICD-10-CM | POA: Insufficient documentation

## 2021-04-28 DIAGNOSIS — H905 Unspecified sensorineural hearing loss: Secondary | ICD-10-CM | POA: Insufficient documentation

## 2021-04-28 NOTE — Assessment & Plan Note (Signed)
Also part of referral for [facility] ?

## 2021-04-28 NOTE — Assessment & Plan Note (Signed)
Also for [facility] with RN, PT, aide ?

## 2021-04-28 NOTE — Assessment & Plan Note (Signed)
Mild to mod, to avoid pressure to the elbow and will improved with conservative tx,, declines other specific tx ?

## 2021-04-28 NOTE — Assessment & Plan Note (Signed)
Ok for increased hct 25 qd 

## 2021-04-28 NOTE — Assessment & Plan Note (Signed)
With spastic movement involuntary - declines baclofen for now or neurology referral ?

## 2021-04-28 NOTE — Assessment & Plan Note (Signed)
Worsening, for audiology referral 

## 2021-04-28 NOTE — Assessment & Plan Note (Signed)
For PT with [facility] referral ?

## 2021-05-04 ENCOUNTER — Other Ambulatory Visit: Payer: Self-pay | Admitting: Internal Medicine

## 2021-05-04 DIAGNOSIS — F419 Anxiety disorder, unspecified: Secondary | ICD-10-CM

## 2021-05-17 IMAGING — MR MR HEAD W/O CM
12 of 13 series · 44 of 48 positions shown · non-contrast
Comparison: CT head and CTA head and neck 11/25/2018

CLINICAL DATA: Stroke.

EXAM:
MRI HEAD WITHOUT CONTRAST
TECHNIQUE: Multiplanar, multiecho pulse sequences of the brain and surrounding
structures were obtained without intravenous contrast.

[Series 5: DWI · axial · 3.0mm · 0.92mm/px · z∈[-124,+27]mm · 8 of 104 slices shown (1 of 4)]
[im 1/104]
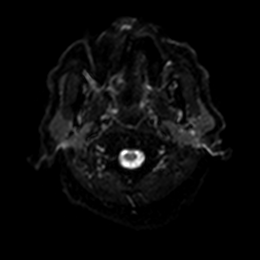
[im 15/104]
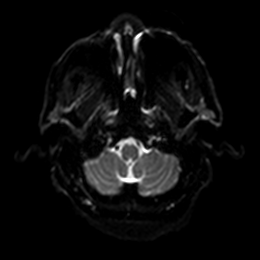
[im 30/104]
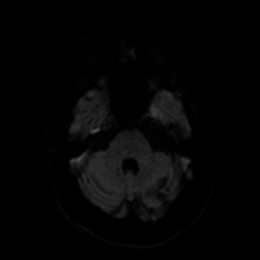
[im 45/104]
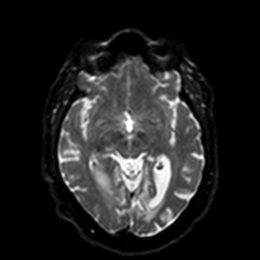
[im 59/104]
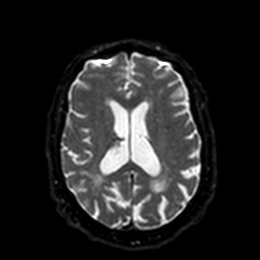
[im 74/104]
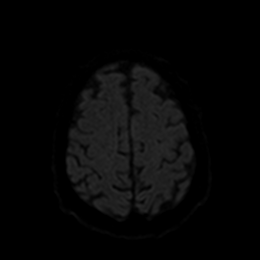
[im 89/104]
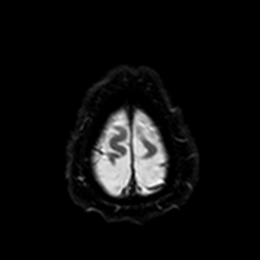
[im 104/104]
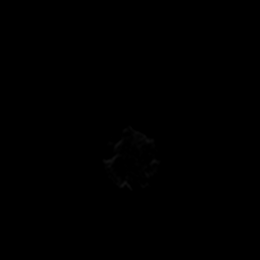

[Series 6: DWI · axial · 3.0mm · 0.92mm/px · z∈[-124,+27]mm · 4 of 51 slices shown (2 of 4)]
[im 1/51]
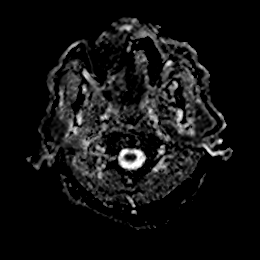
[im 17/51]
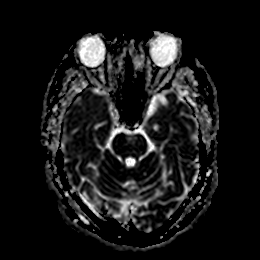
[im 34/51]
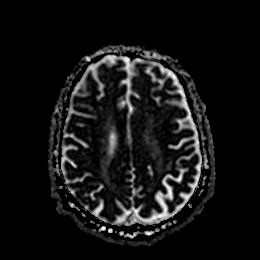
[im 51/51]
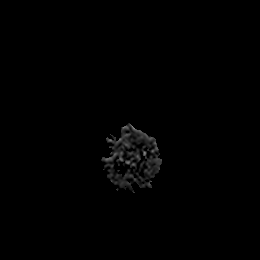

[Series 7: DWI · coronal · 4.0mm · 0.88mm/px · 6 of 78 slices shown (3 of 4)]
[im 1/78]
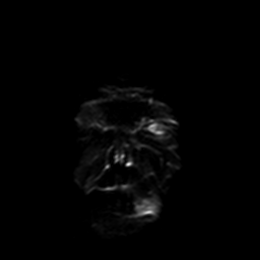
[im 16/78]
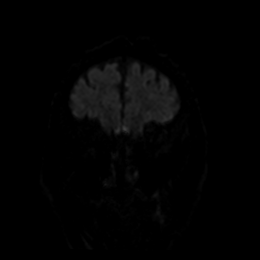
[im 31/78]
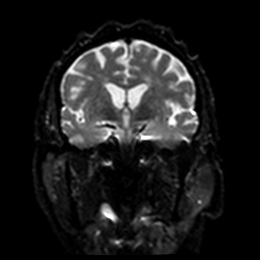
[im 47/78]
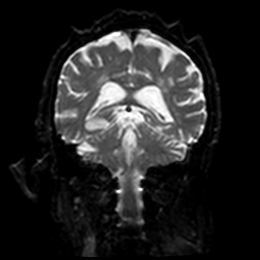
[im 62/78]
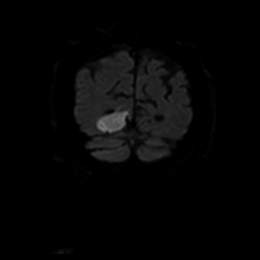
[im 78/78]
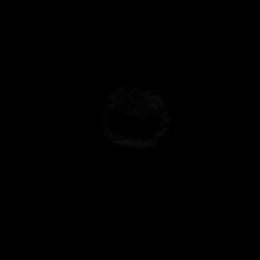

[Series 8: DWI · coronal · 4.0mm · 0.88mm/px · 3 of 39 slices shown (4 of 4)]
[im 1/39]
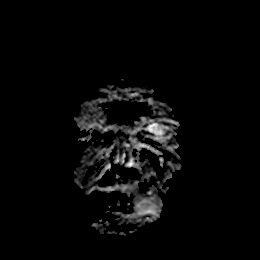
[im 20/39]
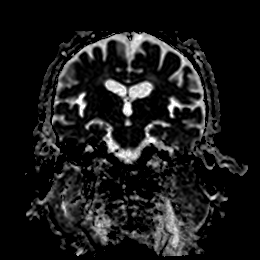
[im 39/39]
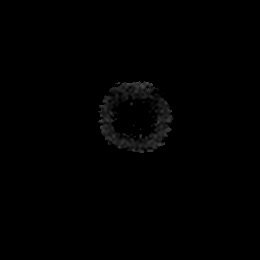

[Series 9: FLAIR · axial · 5.0mm · 0.47mm/px · z∈[-118,+24]mm · 2 of 25 slices shown]
[im 1/25]
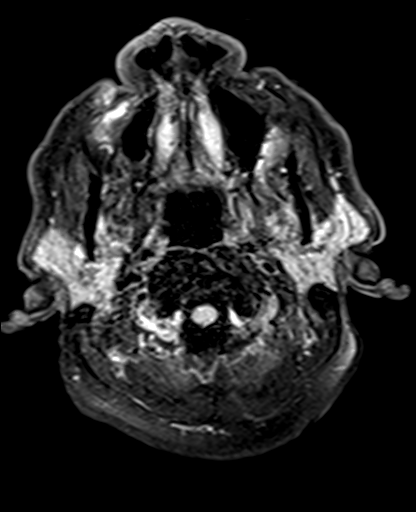
[im 25/25]
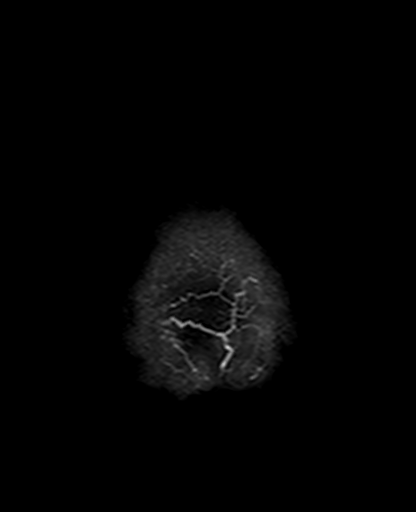

[Series 10: mag_images · axial · 3.0mm · 0.94mm/px · z∈[-122,+28]mm · 4 of 52 slices shown]
[im 1/52]
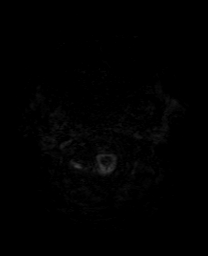
[im 18/52]
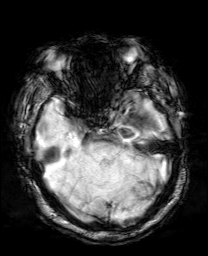
[im 35/52]
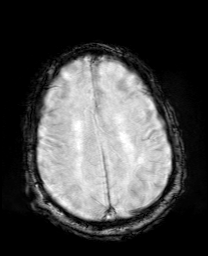
[im 52/52]
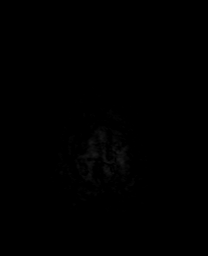

[Series 11: pha_images · axial · 3.0mm · 0.94mm/px · z∈[-122,+28]mm · 4 of 52 slices shown]
[im 1/52]
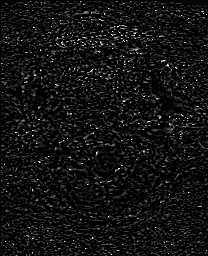
[im 18/52]
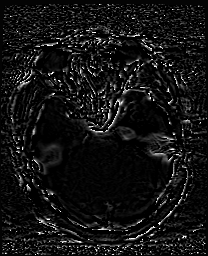
[im 35/52]
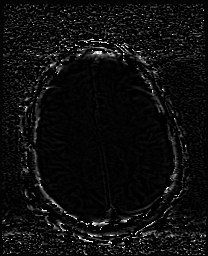
[im 52/52]
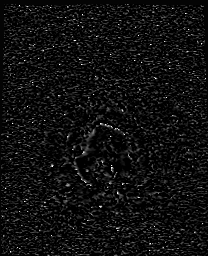

[Series 12: swi_images · axial · 3.0mm · 0.94mm/px · z∈[-122,+28]mm · 4 of 52 slices shown]
[im 1/52]
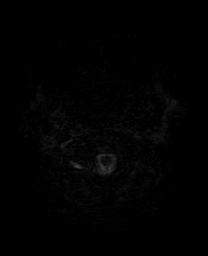
[im 18/52]
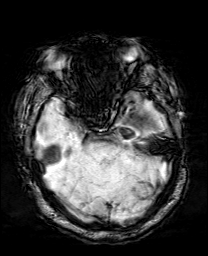
[im 35/52]
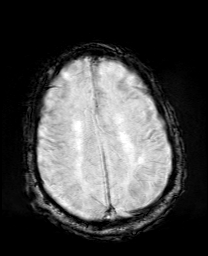
[im 52/52]
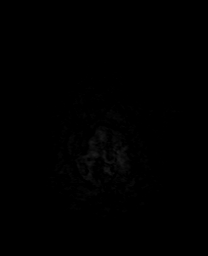

[Series 13: mip_images(sw) · axial · 24.0mm · 0.94mm/px · z∈[-112,+18]mm · 3 of 45 slices shown]
[im 1/45]
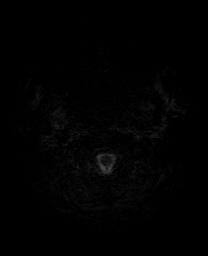
[im 23/45]
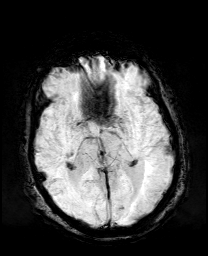
[im 45/45]
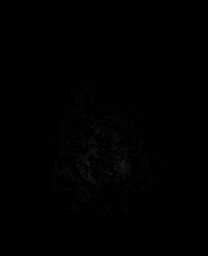

[Series 14: T1 · sagittal · 5.0mm · 0.75mm/px · 2 of 25 slices shown]
[im 1/25]
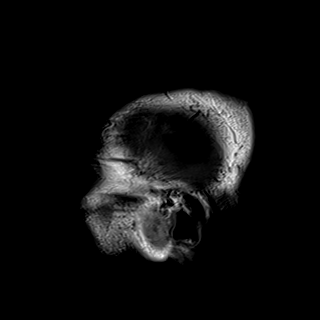
[im 25/25]
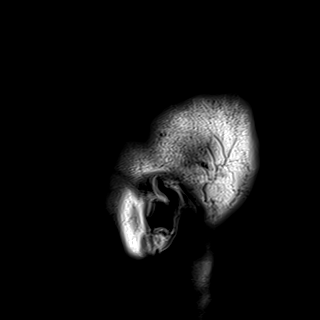

[Series 15: T2 · axial · 5.0mm · 0.75mm/px · z∈[-120,+22]mm · 2 of 25 slices shown (1 of 2)]
[im 1/25]
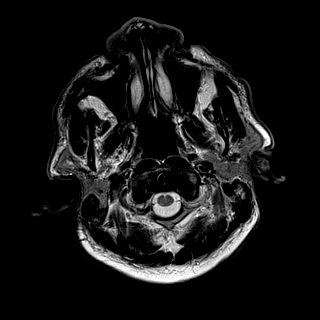
[im 25/25]
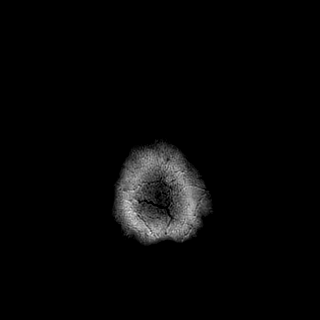

[Series 17: T2 · coronal · 5.0mm · 0.72mm/px · 2 of 32 slices shown (2 of 2)]
[im 1/32]
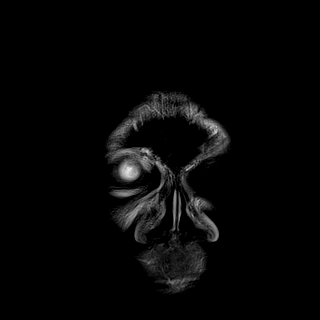
[im 32/32]
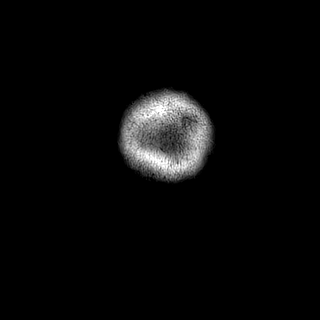

[44 of 48 positions shown; findings below may reference images not displayed]

FINDINGS: Brain: Acute infarct right PCA territory. Restricted diffusion in
the right thalamus as well as the right posterior hippocampus and
inferior medial occipital lobe.

Generalized atrophy. Chronic ischemic change in the white matter.
Chronic infarcts in the cerebellum bilaterally. Small chronic
infarct left thalamus. Small chronic hemorrhage right posterolateral
temporal lobe. Chronic hemorrhage in the left cerebellum. No mass
lesion. Ventricle size normal. No midline shift. Mild atrophy.

Vascular: Normal arterial flow voids

Skull and upper cervical spine: Negative

Sinuses/Orbits: Paranasal sinuses clear.  Bilateral cataract surgery

Other: None
IMPRESSION: Acute infarct right PCA territory without hemorrhage.

Atrophy and chronic ischemic change as above. Chronic hemorrhage
right posterolateral temporal lobe and left cerebellum.

## 2021-05-22 IMAGING — CT CT CERVICAL SPINE W/O CM
3 of 4 series · 9 of 35 positions shown, 11 images · non-contrast
Comparison: CT head 11/25/2018, MR brain 11/26/2018

CLINICAL DATA: Head trauma, ataxia, fell last night trying to get
to the bathroom, arm pain, history hypertension, former smoker,
recent stroke

EXAM:
CT HEAD WITHOUT CONTRAST
CT CERVICAL SPINE WITHOUT CONTRAST
TECHNIQUE: Multidetector CT imaging of the head and cervical spine was
performed following the standard protocol without intravenous
contrast. Multiplanar CT image reconstructions of the cervical spine
were also generated.

[Series 6: c_spine 1.0 st thins · axial · 0.30mm/px · z∈[-208,-208]mm · 1 of 306 slices shown, 2 images]
[im 153/306  soft-tissue]
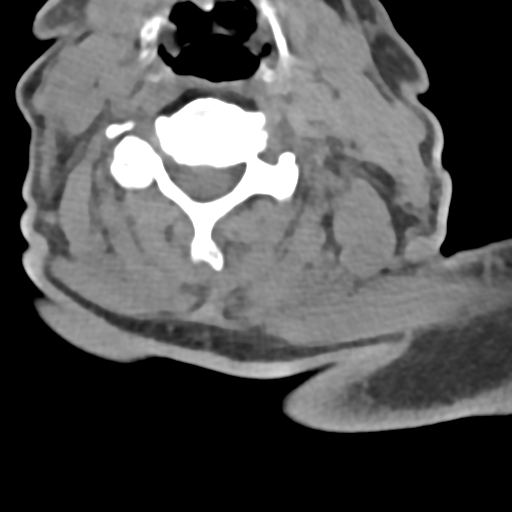
[im 153/306  bone]
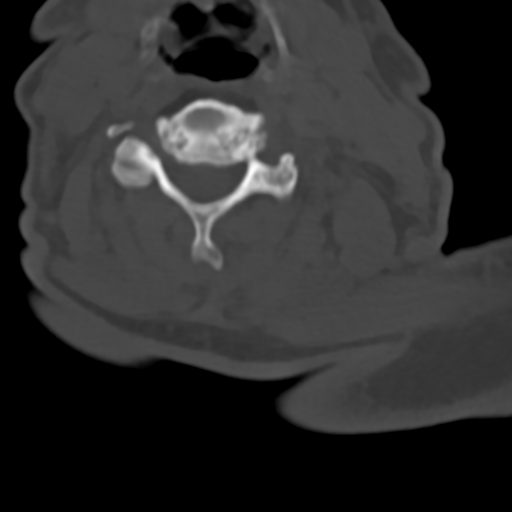

[Series 8: c_spine 2.0 sag bone · sagittal · 0.25mm/px · 5 of 61 slices shown, 6 images]
[im 21/61  bone]
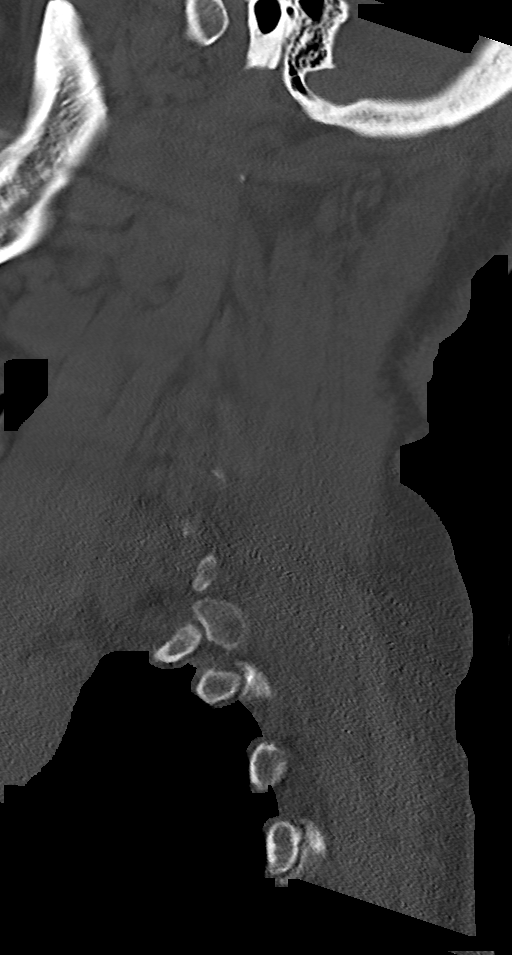
[im 26/61  bone]
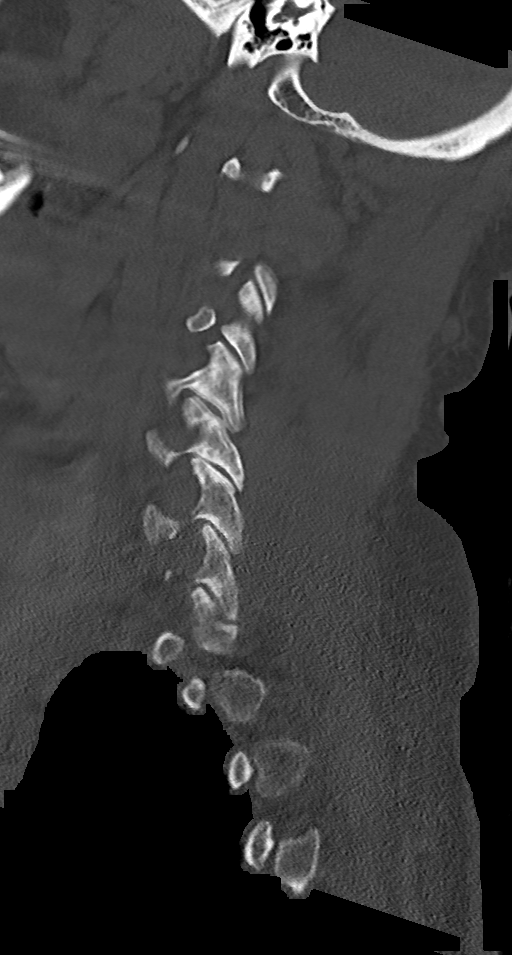
[im 31/61  soft-tissue]
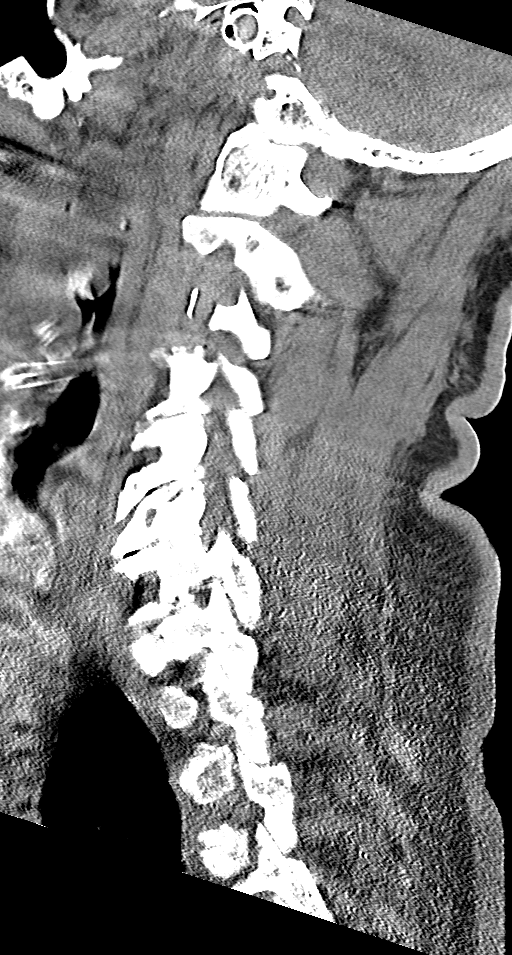
[im 31/61  bone]
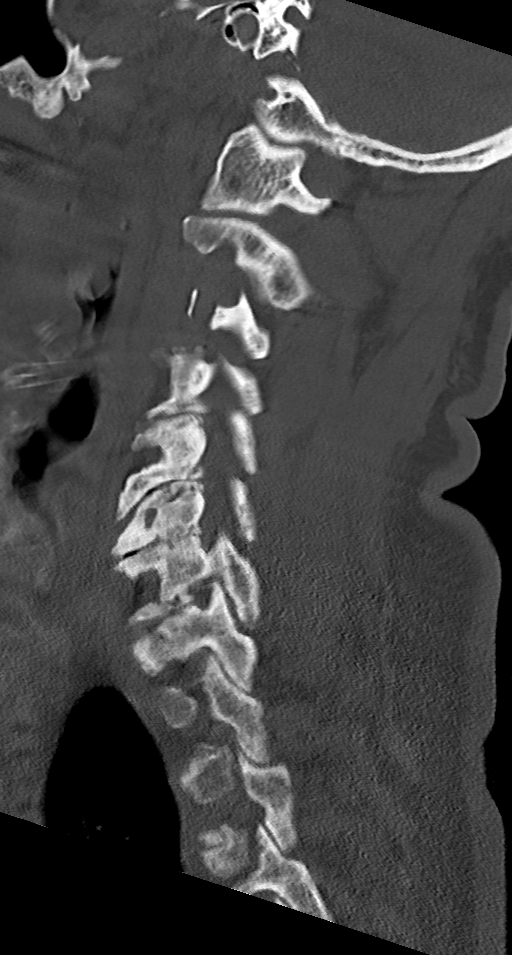
[im 36/61  bone]
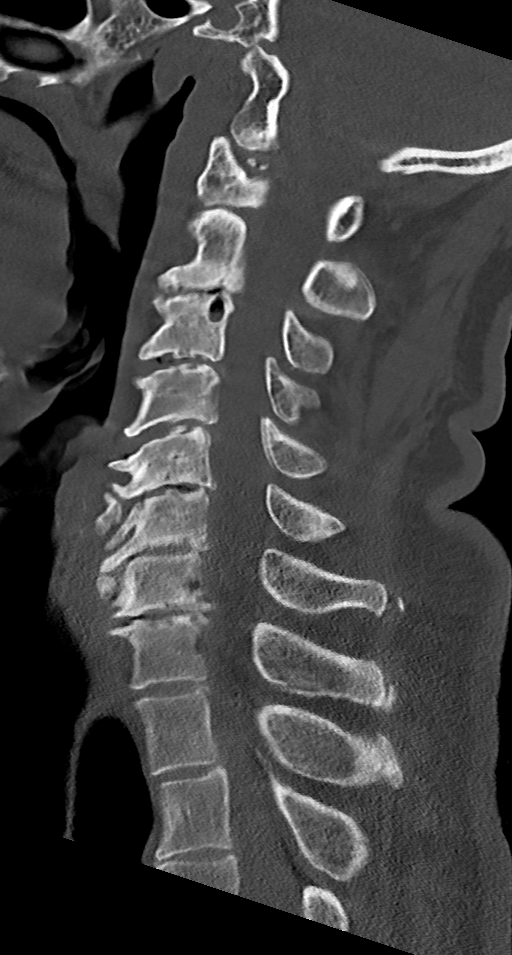
[im 41/61  bone]
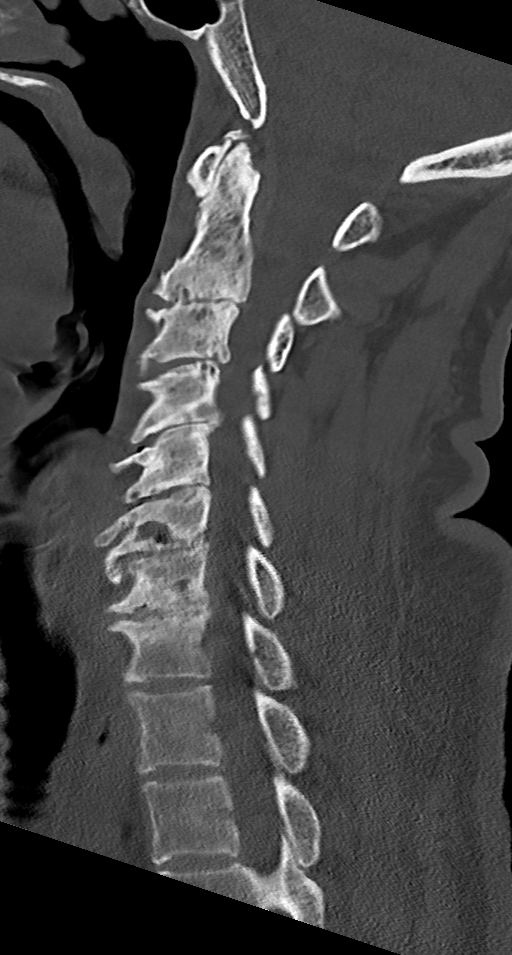

[Series 9: c_spine 2.0 cor bone · coronal · 0.25mm/px · 3 of 61 slices shown]
[im 13/61  bone]
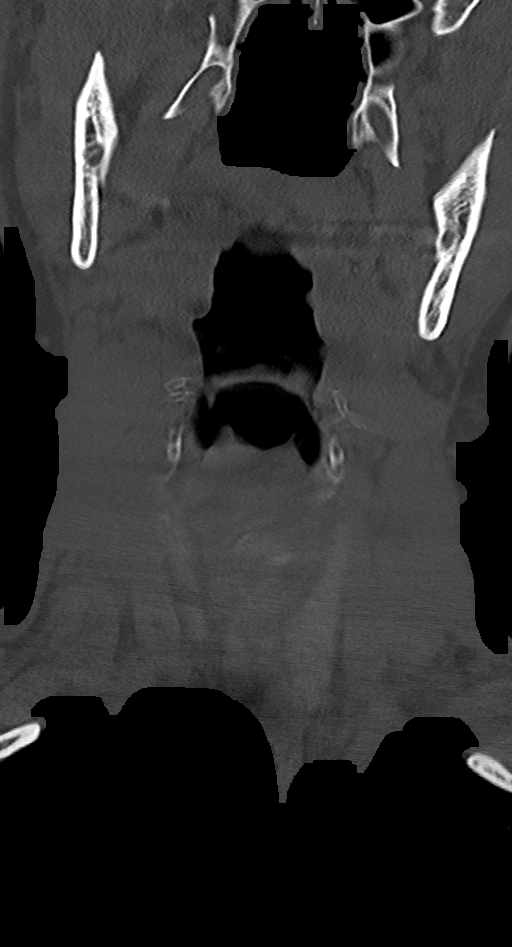
[im 25/61  bone]
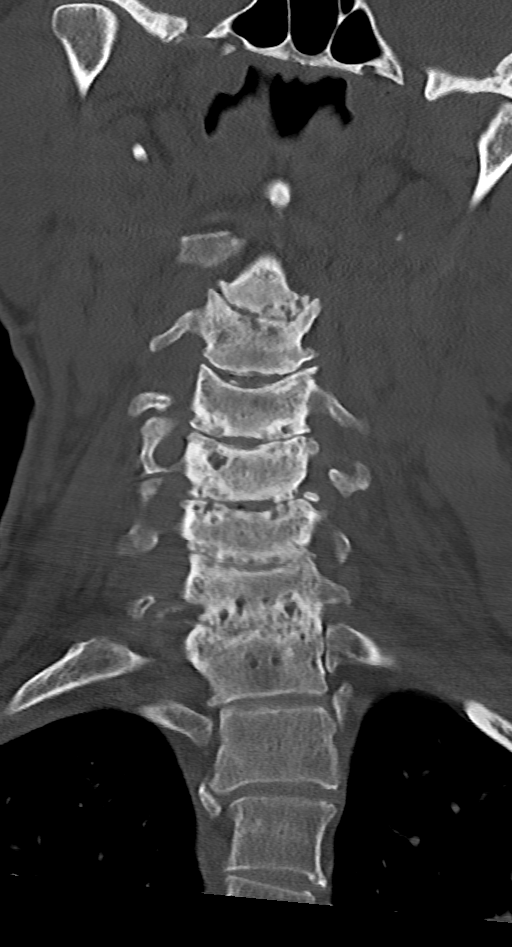
[im 37/61  bone]
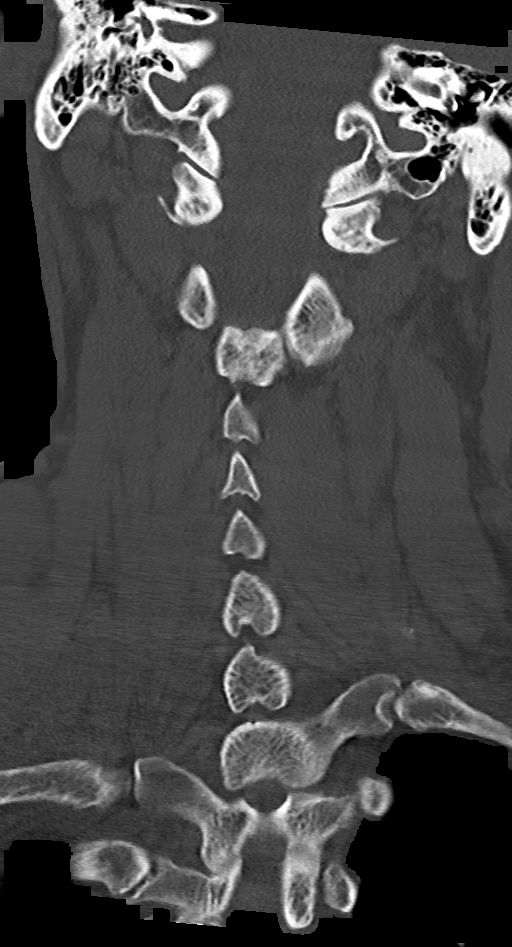

[9 of 35 positions shown; findings below may reference images not displayed]

FINDINGS: CT HEAD FINDINGS

Brain: Generalized atrophy. Normal ventricular morphology. No
midline shift or mass effect. Small vessel chronic ischemic changes
of deep cerebral white matter. Old BILATERAL [place] ir
infarcts. Subacute RIGHT thalamic infarct, appears enlarged since
the previous exam. Progressive low-attenuation within occipital lobe
white matter RIGHT occipital lobe corresponding to PCA territory
RIGHT occipital infarct seen on prior MR. No new infarction or mass
lesion. No extra-axial fluid collections or intracranial hemorrhage.

Vascular: No definite hyperdense vessels

Skull: Intact

Sinuses/Orbits: Clear

Other: N/A

CT CERVICAL SPINE FINDINGS

Alignment: Normal

Skull base and vertebrae: Vertebral body heights maintained without
fracture or bone destruction. Minimal scattered facet degenerative
changes. Diffuse disc space narrowing and endplate spur formation
throughout cervical spine. Bulky anterior spurs at C5 and C6.

Soft tissues and spinal canal: Prevertebral soft tissues normal
thickness

Disc levels: Multilevel AP narrowing of the spinal canal, mild,
multifactorial.

Upper chest: Lung apices clear

Other: N/A
IMPRESSION: Atrophy with small vessel chronic ischemic changes of deep cerebral
white matter.

Subacute RIGHT occipital lobe and RIGHT thalamic infarcts.

Old infarcts BILATERAL cerebellar hemispheres.

No new intracranial abnormalities.

Degenerative disc disease changes of the cervical spine as above.

No acute cervical spine abnormalities.

## 2021-05-25 ENCOUNTER — Other Ambulatory Visit: Payer: Self-pay | Admitting: Internal Medicine

## 2021-06-03 ENCOUNTER — Other Ambulatory Visit: Payer: Self-pay | Admitting: Internal Medicine

## 2021-06-03 DIAGNOSIS — F419 Anxiety disorder, unspecified: Secondary | ICD-10-CM

## 2021-06-17 ENCOUNTER — Ambulatory Visit: Payer: Medicare Other

## 2021-06-17 DIAGNOSIS — R609 Edema, unspecified: Secondary | ICD-10-CM

## 2021-06-17 DIAGNOSIS — I10 Essential (primary) hypertension: Secondary | ICD-10-CM

## 2021-06-17 DIAGNOSIS — E782 Mixed hyperlipidemia: Secondary | ICD-10-CM

## 2021-06-17 NOTE — Patient Instructions (Signed)
Visit Information  Following are the goals we discussed today:   Manage My Medicine   Timeframe:  Long-Range Goal Priority:  High Start Date:       02/28/20                      Expected End Date: 06/2022                  Follow Up Date 12/2021   - call for medicine refill 2 or 3 days before it runs out - call if I am sick and can't take my medicine - keep a list of all the medicines I take; vitamins and herbals too    Why is this important?   These steps will help you keep on track with your medicines.  Plan: Telephone follow up appointment with care management team member scheduled for:  6 months The patient has been provided with contact information for the care management team and has been advised to call with any health related questions or concerns.   Ellin Saba, PharmD Clinical Pharmacist, [facility]   Please call the care guide team at [phone] if you need to cancel or reschedule your appointment.   The patient verbalized understanding of instructions, educational materials, and care plan provided today and DECLINED offer to receive copy of patient instructions, educational materials, and care plan.

## 2021-06-17 NOTE — Progress Notes (Signed)
Chronic Care Management Pharmacy Note  06/17/2021 Name:  Jeff Lamb MRN:  [id number] DOB:  [birth year]   Summary: -Spoke with patient and his cousin - report compliance to medications, notes that swelling in left leg has remained since increasing hctz - denies warm to touch / SOB associated with swelling - notes to neuropathy at times in left leg from knee to ankle  -Reviewed Lower Extremity Venous duplex on file, shows no signs of obstructions / clot - pt and cousin did not recall this being completed  -BP controlled with most recent office visits  -Denies any issues or concerns regarding current medications    Recommendations/Changes made from today's visit: -Recommending no changes to medications at this time, advised for patient to reach out for follow up appointment with PCP should swelling continue  Plan: -F/u in 6 months    Subjective: Jeff Lamb is an 88 y.o. year old male who is a primary patient of [facility] Koch, MD.  The CCM team was consulted for assistance with disease management and care coordination needs.    Engaged with patient by telephone for follow up visit in response to provider referral for pharmacy case management and/or care coordination services.   Consent to Services:  The patient was given information about Chronic Care Management services, agreed to services, and gave verbal consent prior to initiation of services.  Please see initial visit note for detailed documentation.   Patient Care Team: [facility] Koch, MD as PCP - General (Internal Medicine) Charlton Haws, [facility] as Pharmacist (Pharmacist) Juluis Rainier as Consulting Physician (Optometry)  Recent office visits: 04/23/2021 - Dr. Jenny Reichmann  -leg swelling - increase hctz to 51m daily, [facility] audiology referral for hearing loss   Recent consult visits: None since last visit   Hospital visits: None in previous 6 months   Objective:  Lab  Results  Component Value Date   CREATININE 1.04 08/25/2020   BUN 13 08/25/2020   GFR 56.04 (L) 03/31/2020   GFRNONAA >60 08/25/2020   GFRAA >60 12/01/2018   NA 138 08/25/2020   K 4.7 08/25/2020   CALCIUM 9.3 08/25/2020   CO2 28 08/25/2020    Lab Results  Component Value Date/Time   HGBA1C 6.1 03/31/2020 02:56 PM   HGBA1C 6.4 (H) 11/26/2018 03:53 AM   GFR 56.04 (L) 03/31/2020 02:56 PM   GFR 66.91 01/13/2016 01:20 PM    Last diabetic Eye exam: No results found for: HMDIABEYEEXA  Last diabetic Foot exam: No results found for: HMDIABFOOTEX   Lab Results  Component Value Date   CHOL 110 03/31/2020   HDL 65.10 03/31/2020   LDLCALC 36 03/31/2020   TRIG 40.0 03/31/2020   CHOLHDL 2 03/31/2020       Latest Ref Rng & Units 08/25/2020    5:46 PM 03/31/2020    2:56 PM 12/01/2018    9:35 AM  Hepatic Function  Total Protein 6.5 - 8.1 g/dL 6.5   7.0   4.6    Albumin 3.5 - 5.0 g/dL 3.5   4.1   2.2    AST 15 - 41 U/L _0 ALT 0 - 44 U/L _1 Alk Phosphatase 38 - 126 U/L 55   48   41    Total Bilirubin 0.3 - 1.2 mg/dL 1.6   1.0   1.5  No results found for: TSH, FREET4     Latest Ref Rng & Units 08/25/2020    5:46 PM 03/31/2020    2:56 PM 12/01/2018    8:45 AM  CBC  WBC 4.0 - 10.5 K/uL 5.9   5.8   8.5    Hemoglobin 13.0 - 17.0 g/dL 14.4   13.9   16.4    Hematocrit 39.0 - 52.0 % 45.6   42.5   50.5    Platelets 150 - 400 K/uL 180   180.0   214      No results found for: VD25OH  Clinical ASCVD: Yes  The ASCVD Risk score (Arnett DK, et al., 2019) failed to calculate for the following reasons:   The 2019 ASCVD risk score is only valid for ages 8 to 44       09/10/2020    2:31 PM 03/31/2020    2:30 PM 12/21/2018    4:59 PM  Depression screen PHQ 2/9  Decreased Interest 0 0 0  Down, Depressed, Hopeless 0 0 0  PHQ - 2 Score 0 0 0         View : No data to display.           Social History   Tobacco Use  Smoking Status Former    Packs/day: 5.00   Years: 20.00   Pack years: 100.00   Types: Cigars, Cigarettes   Quit date: 12/22/1980   Years since quitting: 40.5  Smokeless Tobacco Never   BP Readings from Last 3 Encounters:  04/23/21 120/78  08/26/20 135/72  08/21/20 128/82   Pulse Readings from Last 3 Encounters:  04/23/21 (!) 54  08/26/20 85  08/21/20 67   Wt Readings from Last 3 Encounters:  03/31/20 160 lb 6.4 oz (72.8 kg)  11/14/19 166 lb (75.3 kg)  10/08/19 166 lb (75.3 kg)    Assessment/Interventions: Review of patient past medical history, allergies, medications, health status, including review of consultants reports, laboratory and other test data, was performed as part of comprehensive evaluation and provision of chronic care management services.    SDOH:  (Social Determinants of Health) assessments and interventions performed: Yes   CCM Care Plan  No Known Allergies  Medications Reviewed Today     Reviewed by Tomasa Blase, [facility] (Pharmacist) on 06/17/21 at 1314  Med List Status: <None>   Medication Order Taking? Sig Documenting Provider Last Dose Status Informant  acetaminophen (TYLENOL) 325 MG tablet [id number] Yes Take 325-650 mg by mouth every 6 (six) hours as needed. [provider] Taking Active   amLODipine (NORVASC) 2.5 MG tablet [id number] Yes TAKE ONE TABLET BY MOUTH EVERY MORNING [facility] Koch, MD Taking Active   atorvastatin (LIPITOR) 10 MG tablet [id number] Yes Take 1 tablet (10 mg total) by mouth daily. [facility] Koch, MD Taking Active   ELIQUIS 5 MG TABS tablet [id number] Yes TAKE ONE TABLET BY MOUTH AT [facility] AND AT BEDTIME Hoyt Koch, MD Taking Active   escitalopram (LEXAPRO) 10 MG tablet [id number] Yes Take 1 tablet (10 mg total) by mouth every morning. Annual appt is due w/labs must see provider for future refills [facility] Koch, MD Taking Active   gabapentin (NEURONTIN) 100 MG capsule [id number] Yes TAKE ONE CAPSULE BY MOUTH  EVERYDAY AT BEDTIME [facility] Koch, MD Taking Active   hydrochlorothiazide (HYDRODIURIL) 25 MG tablet [id number] Yes Take 1 tablet (25 mg total) by mouth daily. Biagio Borg, MD Taking Active   Multiple Vitamin (  MULTIVITAMIN WITH MINERALS) TABS tablet [id number] Yes Take 1 tablet by mouth every morning. [provider] Taking Active Self  Naphazoline HCl (CLEAR EYES OP) [id number] Yes Place 1 drop into both eyes daily as needed (itching/dry eyes). [provider] Taking Active Self  potassium chloride (KLOR-CON) 10 MEQ tablet [id number] Yes Take 10 mEq by mouth daily. [provider] Taking Active   senna-docusate (SENOKOT-S) 8.6-50 MG tablet [id number] Yes Take 1 tablet by mouth at bedtime. [facility] Koch, MD Taking Active             Patient Active Problem List   Diagnosis Date Noted   Bursitis of right elbow 04/28/2021   Sensorineural hearing loss (SNHL) of left ear 04/28/2021   Gait disorder 04/23/2021   Left knee pain 09/27/2019   Left arm pain 05/24/2019   Pulmonary embolism ([place]) 11/26/2018   Prediabetes 11/26/2018   Right shoulder pain 01/17/2018   Ventral hernia without obstruction or gangrene 03/14/2017   History of stroke 07/12/2013   Dependent edema 07/12/2013   Hyperlipidemia 07/12/2013   Erectile dysfunction 08/02/2011   Routine health maintenance 12/24/2010   Hypertension     Immunization History  Administered Date(s) Administered   DTaP 01/12/2008   Influenza Split 10/12/2010   Influenza, High Dose Seasonal PF 03/14/2017, 01/17/2018, 10/29/2018   Influenza,inj,Quad PF,6+ Mos 10/16/2013, 10/13/2015   Moderna Sars-Covid-2 Vaccination 01/08/2019, 02/05/2019   Pneumococcal Conjugate-13 02/12/2008   Pneumococcal Polysaccharide-23 03/21/2014   Tdap 09/24/2016    Conditions to be addressed/monitored:  Hypertension and Hyperlipidemia, hx stroke, hx PE  Patient Care Plan: CCM Pharmacy Care Plan     Problem Identified:  Hypertension and Hyperlipidemia, hx stroke, hx PE   Priority: High     Long-Range Goal: Disease management   Start Date: 02/28/2020  Expected End Date: 06/18/2022  This Visit's Progress: On track  Recent Progress: On track  Priority: High  Note:   Current Barriers:  Unable to independently monitor therapeutic efficacy  Pharmacist Clinical Goal(s):  Patient will achieve adherence to monitoring guidelines and medication adherence to achieve therapeutic efficacy achieve ability to self administer medications as prescribed through use of pill packs as evidenced by patient report through collaboration with PharmD and provider.   Interventions: 1:1 collaboration with [facility] Koch, MD regarding development and update of comprehensive plan of care as evidenced by provider attestation and co-signature Inter-disciplinary care team collaboration (see longitudinal plan of care) Comprehensive medication review performed; medication list updated in electronic medical record  Hypertension (BP goal <140/90) -Controlled - not checking BP at home at this time  BP Readings from Last 3 Encounters:  04/23/21 120/78  08/26/20 135/72  08/21/20 128/82  -Current treatment: Amlodipine 2.5 mg daily  HCTZ 25 mg daily -Denies hypotensive/hypertensive symptoms -Educated on BP goals and benefits of medications for prevention of heart attack, stroke and kidney damage; -Recommended to continue current medication -Pt states that since increasing HCTZ swelling has been uncharged, notes to tingling from knee to ankle in left leg at times, denies any issues with SOB / redness or skin being warm to touch -Recommended for patient to schedule appointment with PCP should swelling continue / pain increase   Hyperlipidemia: (LDL goal < 70) -Hx stroke 2015, 2020 Lab Results  Component Value Date   [place] 36 03/31/2020  -Controlled; -Current treatment: Atorvastatin 10 mg daily -Educated on Cholesterol goals;   Benefits of statin for ASCVD risk reduction; -Recommended to continue current medication  Hx of PE (Goal: prevent subsequent  VTE) -Controlled - pt reports compliance with Eliqius; does not endorse bleeding issues -PE/stroke Nov 2020, [facility]. -Current treatment  Eliquis 5 mg BID -Assessed patient finances. Eliquis is difficult to afford in donut hole, may consider PAP later in the year -Recommended to continue current medication  Muscle pain (Goal: manage pain) -Improving -Current treatment  Gabapentin 100 mg HS Tylenol 325 mg PRN -Recommended to continue current medication -Recommended to increase activity/movement throughout the day; may consider PT again  Health Maintenance -Vaccine gaps: covid booster -Current therapy:  Multivitamin Clear Eyes drops Klor-con 10 mEq daily Senna-docusate HS -Patient is satisfied with current therapy and denies issues -Pt is now receiving medications via [facility] pill packs and reports this is much easier than dealing with bottles -Recommended to continue current medication  Patient Goals/Self-Care Activities Patient will:  - take medications as prescribed -focus on medication adherence by pill packs  Follow Up Plan: Telephone follow up appointment with care management team member scheduled for: 6 months     Medication Assistance: None required.  Patient affirms current coverage meets needs.  Patient's preferred pharmacy is:  [facility] - [place], [place] - [address] [place] [address] Phone: [phone] Fax: [phone]   Uses pill box? Yes Pt endorses 100% compliance  Care Plan and Follow Up Patient Decision:  Patient agrees to Care Plan and Follow-up.  Plan: Telephone follow up appointment with care management team member scheduled for:  6 months  Tomasa Blase, PharmD Clinical Pharmacist, [facility]

## 2021-06-19 ENCOUNTER — Telehealth: Payer: Self-pay

## 2021-06-19 NOTE — Telephone Encounter (Signed)
Spoke with approved patient contact Jeff Lamb to confirm that there was not an order for a MRI this patient.

## 2021-06-29 ENCOUNTER — Other Ambulatory Visit: Payer: Self-pay | Admitting: Internal Medicine

## 2021-06-29 DIAGNOSIS — F419 Anxiety disorder, unspecified: Secondary | ICD-10-CM

## 2021-07-07 ENCOUNTER — Ambulatory Visit: Payer: Medicare HMO | Admitting: Audiologist

## 2021-07-07 ENCOUNTER — Other Ambulatory Visit: Payer: Self-pay | Admitting: Internal Medicine

## 2021-07-07 DIAGNOSIS — H903 Sensorineural hearing loss, bilateral: Secondary | ICD-10-CM

## 2021-07-22 ENCOUNTER — Other Ambulatory Visit: Payer: Self-pay | Admitting: Internal Medicine

## 2021-09-11 ENCOUNTER — Ambulatory Visit: Payer: Medicare Other

## 2021-09-21 ENCOUNTER — Other Ambulatory Visit: Payer: Self-pay | Admitting: Internal Medicine

## 2021-09-21 NOTE — Telephone Encounter (Signed)
Please refill as per office routine med refill policy (all routine meds to be refilled for 3 mo or monthly (per pt preference) up to one year from last visit, then month to month grace period for 3 mo, then further med refills will have to be denied) ? ?

## 2021-09-25 ENCOUNTER — Telehealth: Payer: Self-pay

## 2021-09-25 ENCOUNTER — Ambulatory Visit: Payer: Medicare Other

## 2021-09-25 NOTE — Telephone Encounter (Signed)
Called patient x 3 mailbox full, wrong # and no answer. Patient may reschedule for the next available appointment.  S. Kaelen Caughlin,LPN

## 2021-09-28 ENCOUNTER — Encounter: Payer: Self-pay | Admitting: Internal Medicine

## 2021-09-28 ENCOUNTER — Ambulatory Visit (INDEPENDENT_AMBULATORY_CARE_PROVIDER_SITE_OTHER): Payer: Medicare HMO | Admitting: Internal Medicine

## 2021-09-28 VITALS — BP 132/68 | HR 72

## 2021-09-28 DIAGNOSIS — Z8673 Personal history of transient ischemic attack (TIA), and cerebral infarction without residual deficits: Secondary | ICD-10-CM

## 2021-09-28 DIAGNOSIS — Z0000 Encounter for general adult medical examination without abnormal findings: Secondary | ICD-10-CM

## 2021-09-28 DIAGNOSIS — I10 Essential (primary) hypertension: Secondary | ICD-10-CM

## 2021-09-28 DIAGNOSIS — Z23 Encounter for immunization: Secondary | ICD-10-CM | POA: Diagnosis not present

## 2021-09-28 DIAGNOSIS — R7303 Prediabetes: Secondary | ICD-10-CM

## 2021-09-28 DIAGNOSIS — Z86711 Personal history of pulmonary embolism: Secondary | ICD-10-CM

## 2021-09-28 DIAGNOSIS — E782 Mixed hyperlipidemia: Secondary | ICD-10-CM | POA: Diagnosis not present

## 2021-09-28 NOTE — Progress Notes (Signed)
   Subjective:   Patient ID: Jeff Lamb, male    DOB: August 24, 1929, 88 y.o.   MRN: [id number]  HPI The patient is a 88 YO man coming in for physical.   Review of Systems  Constitutional: Negative.   HENT: Negative.    Eyes: Negative.   Respiratory:  Negative for cough, chest tightness and shortness of breath.   Cardiovascular:  Negative for chest pain, palpitations and leg swelling.  Gastrointestinal:  Negative for abdominal distention, abdominal pain, constipation, diarrhea, nausea and vomiting.  Musculoskeletal:  Positive for gait problem.  Skin: Negative.   Neurological:  Positive for weakness.  Psychiatric/Behavioral: Negative.      Objective:  Physical Exam Constitutional:      Appearance: He is well-developed.  HENT:     Head: Normocephalic and atraumatic.  Cardiovascular:     Rate and Rhythm: Normal rate and regular rhythm.  Pulmonary:     Effort: Pulmonary effort is normal. No respiratory distress.     Breath sounds: Normal breath sounds. No wheezing or rales.  Abdominal:     General: Bowel sounds are normal. There is no distension.     Palpations: Abdomen is soft.     Tenderness: There is no abdominal tenderness. There is no rebound.  Musculoskeletal:     Cervical back: Normal range of motion.  Skin:    General: Skin is warm and dry.  Neurological:     General: No focal deficit present.     Mental Status: He is alert and oriented to person, place, and time.     Coordination: Coordination abnormal.     Vitals:   09/28/21 1449  BP: 132/68  Pulse: 72  SpO2: 96%    Assessment & Plan:  Flu shot given at visit

## 2021-10-01 NOTE — Assessment & Plan Note (Signed)
Checking HgA1c, lipid panel, CMP. BP at goal. Adjust as needed. Taking eliquis BID to prevent stroke.

## 2021-10-01 NOTE — Assessment & Plan Note (Signed)
Continue eliquis BID for prevention.

## 2021-10-01 NOTE — Assessment & Plan Note (Signed)
BP at goal on amlodipine 2.5 mg daily and hctz 25 mg daily. Checking CMP and adjust as needed.

## 2021-10-01 NOTE — Assessment & Plan Note (Signed)
Checking lipid panel and adjust as needed his lipitor 10 mg daily.

## 2021-10-01 NOTE — Assessment & Plan Note (Signed)
Checking HgA1c and adjust as needed.  

## 2021-10-01 NOTE — Assessment & Plan Note (Signed)
Flu shot given. Covid-19 counseled. Pneumonia complete. Shingrix counseled to get at pharmacy. Tetanus counseled due at pharmacy. Colonoscopy aged out. Counseled about sun safety and mole surveillance. Counseled about the dangers of distracted driving. Given 10 year screening recommendations.

## 2021-10-14 ENCOUNTER — Telehealth: Payer: Self-pay | Admitting: Internal Medicine

## 2021-10-14 NOTE — Telephone Encounter (Signed)
We received a [facility] form in by fax for patient. I placed in providers box up front

## 2021-10-14 NOTE — Telephone Encounter (Signed)
[facility] form--placed Dr Sharlet Salina box.

## 2021-10-20 ENCOUNTER — Telehealth: Payer: Self-pay | Admitting: *Deleted

## 2021-10-20 NOTE — Telephone Encounter (Signed)
[facility] form [id number]-complete/sign. Faxed [phone]. Mailed copy [employer]- [address] [place] [address]

## 2021-11-14 ENCOUNTER — Other Ambulatory Visit: Payer: Self-pay

## 2021-11-14 ENCOUNTER — Emergency Department (HOSPITAL_COMMUNITY)
Admission: EM | Admit: 2021-11-14 | Discharge: 2021-11-15 | Disposition: A | Discharge status: Skilled Nursing Facility | Payer: Medicare HMO | Attending: Emergency Medicine | Admitting: Emergency Medicine

## 2021-11-14 ENCOUNTER — Encounter (HOSPITAL_COMMUNITY): Payer: Self-pay

## 2021-11-14 DIAGNOSIS — R1032 Left lower quadrant pain: Secondary | ICD-10-CM | POA: Insufficient documentation

## 2021-11-14 DIAGNOSIS — W01198A Fall on same level from slipping, tripping and stumbling with subsequent striking against other object, initial encounter: Secondary | ICD-10-CM | POA: Diagnosis not present

## 2021-11-14 DIAGNOSIS — S2232XA Fracture of one rib, left side, initial encounter for closed fracture: Secondary | ICD-10-CM | POA: Diagnosis not present

## 2021-11-14 DIAGNOSIS — S299XXA Unspecified injury of thorax, initial encounter: Secondary | ICD-10-CM | POA: Diagnosis present

## 2021-11-14 DIAGNOSIS — Z79899 Other long term (current) drug therapy: Secondary | ICD-10-CM | POA: Diagnosis not present

## 2021-11-14 DIAGNOSIS — R944 Abnormal results of kidney function studies: Secondary | ICD-10-CM | POA: Diagnosis not present

## 2021-11-14 DIAGNOSIS — Z7901 Long term (current) use of anticoagulants: Secondary | ICD-10-CM | POA: Insufficient documentation

## 2021-11-14 DIAGNOSIS — R930 Abnormal findings on diagnostic imaging of skull and head, not elsewhere classified: Secondary | ICD-10-CM | POA: Insufficient documentation

## 2021-11-14 DIAGNOSIS — I10 Essential (primary) hypertension: Secondary | ICD-10-CM | POA: Diagnosis not present

## 2021-11-14 LAB — CBC WITH DIFFERENTIAL/PLATELET
Abs Immature Granulocytes: 0.02 10*3/uL (ref 0.00–0.07)
Basophils Absolute: 0.1 10*3/uL (ref 0.0–0.1)
Basophils Relative: 1 %
Eosinophils Absolute: 0.2 10*3/uL (ref 0.0–0.5)
Eosinophils Relative: 2 %
HCT: 42.4 % (ref 39.0–52.0)
Hemoglobin: 13.3 g/dL (ref 13.0–17.0)
Immature Granulocytes: 0 %
Lymphocytes Relative: 25 %
Lymphs Abs: 1.8 10*3/uL (ref 0.7–4.0)
MCH: 32.3 pg (ref 26.0–34.0)
MCHC: 31.4 g/dL (ref 30.0–36.0)
MCV: 102.9 fL — ABNORMAL HIGH (ref 80.0–100.0)
Monocytes Absolute: 0.7 10*3/uL (ref 0.1–1.0)
Monocytes Relative: 10 %
Neutro Abs: 4.4 10*3/uL (ref 1.7–7.7)
Neutrophils Relative %: 62 %
Platelets: 181 10*3/uL (ref 150–400)
RBC: 4.12 MIL/uL — ABNORMAL LOW (ref 4.22–5.81)
RDW: 12.8 % (ref 11.5–15.5)
WBC: 7.2 10*3/uL (ref 4.0–10.5)
nRBC: 0 % (ref 0.0–0.2)

## 2021-11-14 NOTE — ED Triage Notes (Signed)
Pt BIB [facility] from [facility] c/o sudden onset L lower flank pain that started appx 1 hr ago. No urinary/bowel difficulties. Pt is wheelchair bound. Per EMS, pt did fall at 0900 (on eliquis) this morning but denied EMS, no c/o from fall. VSS AO HX- CVA (left sided deficits)
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
## 2021-11-15 ENCOUNTER — Emergency Department (HOSPITAL_COMMUNITY): Payer: Medicare HMO

## 2021-11-15 ENCOUNTER — Other Ambulatory Visit: Payer: Self-pay

## 2021-11-15 LAB — URINALYSIS, ROUTINE W REFLEX MICROSCOPIC
Bilirubin Urine: NEGATIVE
Glucose, UA: NEGATIVE mg/dL
Hgb urine dipstick: NEGATIVE
Ketones, ur: NEGATIVE mg/dL
Leukocytes,Ua: NEGATIVE
Nitrite: NEGATIVE
Protein, ur: NEGATIVE mg/dL
Specific Gravity, Urine: 1.014 (ref 1.005–1.030)
pH: 6 (ref 5.0–8.0)

## 2021-11-15 LAB — COMPREHENSIVE METABOLIC PANEL
ALT: 15 U/L (ref 0–44)
AST: 28 U/L (ref 15–41)
Albumin: 3.7 g/dL (ref 3.5–5.0)
Alkaline Phosphatase: 50 U/L (ref 38–126)
Anion gap: 9 (ref 5–15)
BUN: 16 mg/dL (ref 8–23)
CO2: 30 mmol/L (ref 22–32)
Calcium: 9.3 mg/dL (ref 8.9–10.3)
Chloride: 98 mmol/L (ref 98–111)
Creatinine, Ser: 1.25 mg/dL — ABNORMAL HIGH (ref 0.61–1.24)
GFR, Estimated: 55 mL/min — ABNORMAL LOW (ref >60)
Glucose, Bld: 111 mg/dL — ABNORMAL HIGH (ref 70–99)
Potassium: 4.3 mmol/L (ref 3.5–5.1)
Sodium: 137 mmol/L (ref 135–145)
Total Bilirubin: 1.1 mg/dL (ref 0.3–1.2)
Total Protein: 7.1 g/dL (ref 6.5–8.1)

## 2021-11-15 LAB — TROPONIN I (HIGH SENSITIVITY)
Troponin I (High Sensitivity): 9 ng/L (ref <18)
Troponin I (High Sensitivity): 9 ng/L (ref <18)

## 2021-11-15 LAB — LIPASE, BLOOD: Lipase: 29 U/L (ref 11–51)

## 2021-11-15 LAB — PROTIME-INR
INR: 1 (ref 0.8–1.2)
Prothrombin Time: 13.5 seconds (ref 11.4–15.2)

## 2021-11-15 MED ORDER — ONDANSETRON HCL 4 MG/2ML IJ SOLN
4.0000 mg | Freq: Once | INTRAMUSCULAR | Status: AC
  Administered 2021-11-15: 4 mg via INTRAVENOUS
  Filled 2021-11-15: qty 2

## 2021-11-15 MED ORDER — OXYCODONE-ACETAMINOPHEN 5-325 MG PO TABS: 1.0000 | ORAL_TABLET | Freq: Four times a day (QID) | ORAL | 0 refills | Status: DC | PRN

## 2021-11-15 MED ORDER — IOHEXOL 350 MG/ML SOLN
75.0000 mL | Freq: Once | INTRAVENOUS | Status: AC | PRN
  Administered 2021-11-15: 75 mL via INTRAVENOUS

## 2021-11-15 MED ORDER — MORPHINE SULFATE (PF) 2 MG/ML IV SOLN
2.0000 mg | Freq: Once | INTRAVENOUS | Status: AC
  Administered 2021-11-15: 2 mg via INTRAVENOUS
  Filled 2021-11-15: qty 1

## 2021-11-15 NOTE — Discharge Instructions (Addendum)
You were seen in the ER today for your side pain. You have a broken rib on your left side.  Please use the incentive spirometer provided 3 times per day and you may utilize the prescribed pain medication as needed.  Your primary care doctor can follow-up on your symptoms.  Return to the ER with any new severe symptoms.

## 2021-11-15 NOTE — ED Notes (Signed)
Patient transported to CT 

## 2021-11-15 NOTE — ED Notes (Signed)
Patient verbalizes understanding of d/c instructions. Opportunities for questions and answers were provided. Pt d/c from ED and transferred back to [facility]. Pt transferred with belongings and apartment key. Pt's living facility did not answer for report, report given to [facility].

## 2021-11-16 ENCOUNTER — Other Ambulatory Visit: Payer: Self-pay | Admitting: Internal Medicine

## 2021-11-18 ENCOUNTER — Telehealth: Payer: Self-pay

## 2021-11-18 NOTE — Telephone Encounter (Signed)
Called pt on both numbers cell phone is vsm is full and the house phone he no loner lives there. Wasn't able to leave to voicemail was told to keep trying to reach out

## 2021-11-19 ENCOUNTER — Telehealth: Payer: Self-pay

## 2021-11-19 NOTE — Telephone Encounter (Signed)
Transition Care Management Unsuccessful Follow-up Telephone Call  Patient discharged to Skilled nursing facuility  Date of discharge and from where:  [facility] on November 14, 2021  Attempts:  1st Attempt  Reason for unsuccessful TCM follow-up call:  Left voice message

## 2021-12-17 ENCOUNTER — Telehealth: Payer: Medicare HMO

## 2022-01-12 ENCOUNTER — Encounter: Payer: Self-pay | Admitting: Internal Medicine

## 2022-01-12 ENCOUNTER — Ambulatory Visit (INDEPENDENT_AMBULATORY_CARE_PROVIDER_SITE_OTHER): Payer: Medicare HMO | Admitting: Internal Medicine

## 2022-01-12 VITALS — BP 130/90 | HR 80 | Temp 98.6°F

## 2022-01-12 DIAGNOSIS — I10 Essential (primary) hypertension: Secondary | ICD-10-CM

## 2022-01-12 DIAGNOSIS — R0781 Pleurodynia: Secondary | ICD-10-CM
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
## 2022-01-13 DIAGNOSIS — R0781 Pleurodynia: Secondary | ICD-10-CM | POA: Insufficient documentation
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
## 2022-01-13 NOTE — Assessment & Plan Note (Signed)
BP at goal, he did not get labs after last visit but labs from ER stable to continue on current hctz 25 mg daily and amlodipine 2.5 mg daily so will continue.

## 2022-02-17 ENCOUNTER — Other Ambulatory Visit: Payer: Self-pay | Admitting: Internal Medicine

## 2022-03-18 ENCOUNTER — Emergency Department (HOSPITAL_COMMUNITY)
Admission: EM | Admit: 2022-03-18 | Discharge: 2022-03-18 | Disposition: A | Discharge status: Home / Self Care | Payer: Medicare HMO | Attending: Emergency Medicine | Admitting: Emergency Medicine

## 2022-03-18 ENCOUNTER — Emergency Department (HOSPITAL_COMMUNITY): Payer: Medicare HMO

## 2022-03-18 DIAGNOSIS — Z79899 Other long term (current) drug therapy: Secondary | ICD-10-CM | POA: Diagnosis not present

## 2022-03-18 DIAGNOSIS — W228XXA Striking against or struck by other objects, initial encounter: Secondary | ICD-10-CM | POA: Insufficient documentation

## 2022-03-18 DIAGNOSIS — R531 Weakness: Secondary | ICD-10-CM | POA: Insufficient documentation

## 2022-03-18 DIAGNOSIS — M79631 Pain in right forearm: Secondary | ICD-10-CM | POA: Diagnosis not present

## 2022-03-18 DIAGNOSIS — R55 Syncope and collapse: Secondary | ICD-10-CM | POA: Insufficient documentation

## 2022-03-18 DIAGNOSIS — I10 Essential (primary) hypertension: Secondary | ICD-10-CM | POA: Diagnosis not present

## 2022-03-18 DIAGNOSIS — Z86711 Personal history of pulmonary embolism: Secondary | ICD-10-CM | POA: Diagnosis not present

## 2022-03-18 DIAGNOSIS — Z7901 Long term (current) use of anticoagulants: Secondary | ICD-10-CM | POA: Insufficient documentation

## 2022-03-18 LAB — CBC WITH DIFFERENTIAL/PLATELET
Abs Immature Granulocytes: 0.02 10*3/uL (ref 0.00–0.07)
Basophils Absolute: 0.1 10*3/uL (ref 0.0–0.1)
Basophils Relative: 1 %
Eosinophils Absolute: 0.1 10*3/uL (ref 0.0–0.5)
Eosinophils Relative: 1 %
HCT: 40.5 % (ref 39.0–52.0)
Hemoglobin: 12.6 g/dL — ABNORMAL LOW (ref 13.0–17.0)
Immature Granulocytes: 0 %
Lymphocytes Relative: 23 %
Lymphs Abs: 1.6 10*3/uL (ref 0.7–4.0)
MCH: 30.9 pg (ref 26.0–34.0)
MCHC: 31.1 g/dL (ref 30.0–36.0)
MCV: 99.3 fL (ref 80.0–100.0)
Monocytes Absolute: 0.6 10*3/uL (ref 0.1–1.0)
Monocytes Relative: 9 %
Neutro Abs: 4.6 10*3/uL (ref 1.7–7.7)
Neutrophils Relative %: 66 %
Platelets: 195 10*3/uL (ref 150–400)
RBC: 4.08 MIL/uL — ABNORMAL LOW (ref 4.22–5.81)
RDW: 12.8 % (ref 11.5–15.5)
WBC: 6.9 10*3/uL (ref 4.0–10.5)
nRBC: 0 % (ref 0.0–0.2)

## 2022-03-18 LAB — BASIC METABOLIC PANEL
Anion gap: 14 (ref 5–15)
BUN: 17 mg/dL (ref 8–23)
CO2: 25 mmol/L (ref 22–32)
Calcium: 9.4 mg/dL (ref 8.9–10.3)
Chloride: 99 mmol/L (ref 98–111)
Creatinine, Ser: 1.14 mg/dL (ref 0.61–1.24)
GFR, Estimated: >60 mL/min (ref >60)
Glucose, Bld: 110 mg/dL — ABNORMAL HIGH (ref 70–99)
Potassium: 3.8 mmol/L (ref 3.5–5.1)
Sodium: 138 mmol/L (ref 135–145)

## 2022-03-18 LAB — TROPONIN I (HIGH SENSITIVITY): Troponin I (High Sensitivity): 10 ng/L (ref <18)
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
## 2022-03-18 NOTE — ED Notes (Signed)
Yellow, PTAR hold
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
## 2022-03-18 NOTE — Discharge Instructions (Signed)
Evaluated in the emergency department, your head CT looks fine, your labs are also normal.  You may continue your home medications as prescribed and follow-up outpatient.  Please return to the ED if you have any hemibody weakness, aphasia, chest pain, shortness of breath, or more syncopal episodes.

## 2022-03-19 ENCOUNTER — Telehealth: Payer: Self-pay

## 2022-03-19 NOTE — Transitions of Care (Post Inpatient/ED Visit) (Signed)
   03/19/2022  Name: Jeff Lamb MRN: [id number] DOB: Apr 19, 1929  Today's TOC FU Call Status: Today's TOC FU Call Status:: Unsuccessul Call (1st Attempt) Unsuccessful Call (1st Attempt) Date: 03/19/22  Attempted to reach the patient regarding the most recent Inpatient/ED visit.  Follow Up Plan: Additional outreach attempts will be made to reach the patient to complete the Transitions of Care (Post Inpatient/ED visit) call.   Signature Juanda Crumble, [place] Direct Dial [phone]

## 2022-03-22 ENCOUNTER — Other Ambulatory Visit: Payer: Self-pay | Admitting: Internal Medicine

## 2022-03-22 ENCOUNTER — Ambulatory Visit: Payer: Self-pay

## 2022-03-22 NOTE — Chronic Care Management (AMB) (Signed)
   03/22/2022  Miguel Rota Goldman 1929/02/28 [id number]   Reason for Encounter: Change in CCM enrollment status   Horris Latino RN Care Manager/Chronic Care Management [phone]

## 2022-03-22 NOTE — Transitions of Care (Post Inpatient/ED Visit) (Signed)
   03/22/2022  Name: Jeff Lamb MRN: [id number] DOB: 01-Feb-1929  Today's TOC FU Call Status: Today's TOC FU Call Status:: Unsuccessful Call (2nd Attempt) Unsuccessful Call (1st Attempt) Date: 03/19/22 Unsuccessful Call (2nd Attempt) Date: 03/22/22  Attempted to reach the patient regarding the most recent Inpatient/ED visit.  Follow Up Plan: Additional outreach attempts will be made to reach the patient to complete the Transitions of Care (Post Inpatient/ED visit) call.   Signature Juanda Crumble, [place] Direct Dial [phone]

## 2022-03-29 NOTE — Transitions of Care (Post Inpatient/ED Visit) (Signed)
   03/29/2022  Name: Jeff Lamb MRN: [id number] DOB: 08/17/1929  Today's TOC FU Call Status: Today's TOC FU Call Status:: Unsuccessful Call (3rd Attempt) Unsuccessful Call (1st Attempt) Date: 03/19/22 Unsuccessful Call (2nd Attempt) Date: 03/22/22 Unsuccessful Call (3rd Attempt) Date: 03/29/22  Attempted to reach the patient regarding the most recent Inpatient/ED visit.  Follow Up Plan: No further outreach attempts will be made at this time. We have been unable to contact the patient.  Signature Juanda Crumble, [place] Direct Dial [phone]

## 2022-04-11 ENCOUNTER — Emergency Department (HOSPITAL_COMMUNITY): Payer: Medicare HMO

## 2022-04-11 ENCOUNTER — Emergency Department (HOSPITAL_COMMUNITY)
Admission: EM | Admit: 2022-04-11 | Discharge: 2022-04-12 | Disposition: A | Discharge status: Skilled Nursing Facility | Payer: Medicare HMO | Attending: Emergency Medicine | Admitting: Emergency Medicine

## 2022-04-11 ENCOUNTER — Other Ambulatory Visit: Payer: Self-pay | Admitting: Internal Medicine

## 2022-04-11 ENCOUNTER — Other Ambulatory Visit: Payer: Self-pay

## 2022-04-11 DIAGNOSIS — Z8673 Personal history of transient ischemic attack (TIA), and cerebral infarction without residual deficits: Secondary | ICD-10-CM | POA: Insufficient documentation

## 2022-04-11 DIAGNOSIS — R410 Disorientation, unspecified: Secondary | ICD-10-CM | POA: Diagnosis not present

## 2022-04-11 DIAGNOSIS — I10 Essential (primary) hypertension: Secondary | ICD-10-CM | POA: Insufficient documentation

## 2022-04-11 DIAGNOSIS — Z87891 Personal history of nicotine dependence: Secondary | ICD-10-CM | POA: Diagnosis not present

## 2022-04-11 DIAGNOSIS — Z79899 Other long term (current) drug therapy: Secondary | ICD-10-CM | POA: Insufficient documentation

## 2022-04-11 DIAGNOSIS — R404 Transient alteration of awareness: Secondary | ICD-10-CM | POA: Diagnosis present

## 2022-04-11 DIAGNOSIS — Z7901 Long term (current) use of anticoagulants: Secondary | ICD-10-CM | POA: Diagnosis not present

## 2022-04-11 LAB — TSH: TSH: 2.437 u[IU]/mL (ref 0.350–4.500)

## 2022-04-11 LAB — TROPONIN I (HIGH SENSITIVITY): Troponin I (High Sensitivity): 10 ng/L (ref <18)

## 2022-04-11 LAB — CBC WITH DIFFERENTIAL/PLATELET
Abs Immature Granulocytes: 0.02 10*3/uL (ref 0.00–0.07)
Basophils Absolute: 0.1 10*3/uL (ref 0.0–0.1)
Basophils Relative: 1 %
Eosinophils Absolute: 0.1 10*3/uL (ref 0.0–0.5)
Eosinophils Relative: 2 %
HCT: 40.7 % (ref 39.0–52.0)
Hemoglobin: 13.2 g/dL (ref 13.0–17.0)
Immature Granulocytes: 0 %
Lymphocytes Relative: 23 %
Lymphs Abs: 1.5 10*3/uL (ref 0.7–4.0)
MCH: 31.3 pg (ref 26.0–34.0)
MCHC: 32.4 g/dL (ref 30.0–36.0)
MCV: 96.4 fL (ref 80.0–100.0)
Monocytes Absolute: 0.6 10*3/uL (ref 0.1–1.0)
Monocytes Relative: 9 %
Neutro Abs: 4 10*3/uL (ref 1.7–7.7)
Neutrophils Relative %: 65 %
Platelets: 208 10*3/uL (ref 150–400)
RBC: 4.22 MIL/uL (ref 4.22–5.81)
RDW: 12.5 % (ref 11.5–15.5)
WBC: 6.2 10*3/uL (ref 4.0–10.5)
nRBC: 0 % (ref 0.0–0.2)

## 2022-04-11 LAB — URINALYSIS, ROUTINE W REFLEX MICROSCOPIC
Bilirubin Urine: NEGATIVE
Glucose, UA: NEGATIVE mg/dL
Hgb urine dipstick: NEGATIVE
Ketones, ur: NEGATIVE mg/dL
Leukocytes,Ua: NEGATIVE
Nitrite: NEGATIVE
Protein, ur: NEGATIVE mg/dL
Specific Gravity, Urine: 1.004 — ABNORMAL LOW (ref 1.005–1.030)
pH: 7 (ref 5.0–8.0)

## 2022-04-11 LAB — COMPREHENSIVE METABOLIC PANEL
ALT: 15 U/L (ref 0–44)
AST: 19 U/L (ref 15–41)
Albumin: 3.6 g/dL (ref 3.5–5.0)
Alkaline Phosphatase: 50 U/L (ref 38–126)
Anion gap: 12 (ref 5–15)
BUN: 16 mg/dL (ref 8–23)
CO2: 29 mmol/L (ref 22–32)
Calcium: 9.3 mg/dL (ref 8.9–10.3)
Chloride: 95 mmol/L — ABNORMAL LOW (ref 98–111)
Creatinine, Ser: 1.19 mg/dL (ref 0.61–1.24)
GFR, Estimated: 58 mL/min — ABNORMAL LOW (ref >60)
Glucose, Bld: 100 mg/dL — ABNORMAL HIGH (ref 70–99)
Potassium: 3.6 mmol/L (ref 3.5–5.1)
Sodium: 136 mmol/L (ref 135–145)
Total Bilirubin: 1 mg/dL (ref 0.3–1.2)
Total Protein: 7.1 g/dL (ref 6.5–8.1)

## 2022-04-11 LAB — CK: Total CK: 93 U/L (ref 49–397)

## 2022-04-11 LAB — T4, FREE: Free T4: 1.27 ng/dL — ABNORMAL HIGH (ref 0.61–1.12)

## 2022-04-11 NOTE — ED Provider Notes (Signed)
  Provider Note MRN:  [id number]  Arrival date & time: 04/12/22    ED Course and Medical Decision Making  Assumed care from Dr. Pearline Cables at shift change.  Brief episode of confusion at care facility.  Was not recognizing staff.  5 to 10 minutes and then resolved.  History of stroke with left-sided deficits.  At baseline currently.  Awaiting CT head and will reassess, candidate for discharge thereafter.  12 AM up: On my assessment patient is in good spirits, fully alert and oriented.  Workup is overall quite reassuring, no acute findings.  Question atelectasis versus infiltrate on chest x-ray.  Patient denies any cough or fever, no hypoxia, no increased work of breathing, and so doubt pneumonia at this time.  Spoke with patient's friend Mrs. Pennix, provided with return precautions.  Appropriate for discharge.  Procedures  Final Clinical Impressions(s) / ED Diagnoses     ICD-10-CM   1. Confusion  R41.0       ED Discharge Orders     None         Discharge Instructions      It was a pleasure caring for you today in the emergency department.  Please return to the emergency department for any worsening or worrisome symptoms.       Barth Kirks. Sedonia Small, [place] [email or web address]    Maudie Flakes, MD 04/12/22 [phone]

## 2022-04-11 NOTE — Discharge Instructions (Addendum)
It was a pleasure caring for you today in the emergency department. ° °Please return to the emergency department for any worsening or worrisome symptoms. ° ° °

## 2022-04-11 NOTE — ED Notes (Signed)
Pt being taken to CT at this time 

## 2022-04-11 NOTE — ED Provider Notes (Signed)
[facility] Provider Note  CSN: [id number] Arrival date & time: 04/11/22 1948  Chief Complaint(s) Memory Loss  HPI Jeff Lamb is a 89 y.o. male with past medical history as below, significant for hearing loss, hypertension, CVA with left-sided residual deficit who presents to the ED with complaint of transient alteration of awareness.  Around 6 PM this evening patient was with facility staff when he was unaware of her surroundings, not sure where he was or the people were there were in the room with him.  No nausea or vomiting, no chest pain, no dyspnea.` symptoms resolved after 5-10 minutes, no seizure activity reported. Now back to baseline, has not re-occurred. No similar symptoms in the past, no hx seizure. Feeling wnl prior to this.   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 Authorizing Provider  acetaminophen (TYLENOL) 325 MG tablet Take 325-650 mg by mouth every 6 (six) hours as needed.    [provider]  amLODipine (NORVASC) 2.5 MG tablet TAKE ONE TABLET BY MOUTH EVERY MORNING 02/17/22   Hoyt Koch, MD  atorvastatin (LIPITOR) 10 MG tablet TAKE ONE TABLET BY MOUTH EVERYDAY AT BEDTIME 11/16/21    Hoyt Koch, MD  ELIQUIS 5 MG TABS tablet TAKE ONE TABLET BY MOUTH EVERY MORNING and TAKE ONE TABLET BY MOUTH EVERYDAY AT BEDTIME 09/29/21   Hoyt Koch, MD  escitalopram (LEXAPRO) 10 MG tablet TAKE ONE TABLET BY MOUTH EVERY MORNING Annual appt is due w/labs must see provider FOR future refills 07/02/21   Hoyt Koch, MD  gabapentin (NEURONTIN) 100 MG capsule TAKE ONE CAPSULE BY MOUTH EVERYDAY AT BEDTIME 11/16/21   Hoyt Koch, MD  hydrochlorothiazide (HYDRODIURIL) 25 MG tablet TAKE ONE TABLET BY MOUTH ONCE DAILY 09/21/21   Biagio Borg, MD  Multiple Vitamin (MULTIVITAMIN WITH MINERALS) TABS tablet Take 1 tablet by mouth every morning.    [provider]  Naphazoline HCl (CLEAR EYES OP) Place 1 drop into both eyes daily as needed (itching/dry eyes).    [provider]  potassium chloride (KLOR-CON M) 10 MEQ tablet TAKE ONE TABLET BY MOUTH ONCE DAILY 03/23/22   Hoyt Koch, MD  senna-docusate (SENOKOT-S) 8.6-50 MG tablet Take 1 tablet by mouth at bedtime. 05/24/19   Hoyt Koch, MD  Past Surgical History Past Surgical History:  Procedure Laterality Date   APPENDECTOMY     31   CATARACT EXTRACTION W/ INTRAOCULAR LENS IMPLANT  October '12   left.   Family History Family History  Problem Relation Age of Onset   Stroke Mother    Hypertension Mother    Cancer Maternal Uncle        throat cancer   Cancer Maternal Uncle        throat cancer   Diabetes Neg Hx    Heart disease Neg Hx     Social History Social History   Tobacco Use   Smoking status: Former    Packs/day: 5.00    Years: 20.00    Additional pack years: 0.00    Total pack years: 100.00    Types: Cigars, Cigarettes    Quit date: 12/22/1980    Years since quitting: 41.3   Smokeless tobacco: Never  Vaping Use   Vaping Use:  Never used  Substance Use Topics   Alcohol use: Yes    Comment: ,moderate use   Drug use: No   Allergies Patient has no known allergies.  Review of Systems Review of Systems  Constitutional:  Negative for chills and fever.  HENT:  Negative for facial swelling and trouble swallowing.   Eyes:  Negative for photophobia and visual disturbance.  Respiratory:  Negative for cough and shortness of breath.   Cardiovascular:  Negative for chest pain and palpitations.  Gastrointestinal:  Negative for abdominal pain, nausea and vomiting.  Endocrine: Negative for polydipsia and polyuria.  Genitourinary:  Negative for difficulty urinating and hematuria.  Musculoskeletal:  Negative for gait problem and joint swelling.  Skin:  Negative for pallor and rash.  Neurological:  Negative for syncope and headaches.  Psychiatric/Behavioral:  Positive for confusion. Negative for agitation.     Physical Exam Vital Signs  I have reviewed the triage vital signs BP 133/84   Pulse 85   Temp 98.7 F (37.1 C) (Oral)   Resp 19   Wt 72.6 kg   SpO2 98%   BMI 22.97 kg/m  Physical Exam Vitals and nursing note reviewed.  Constitutional:      General: He is not in acute distress.    Appearance: Normal appearance. He is well-developed.  HENT:     Head: Normocephalic and atraumatic. No raccoon eyes, Battle's sign, right periorbital erythema or left periorbital erythema.     Right Ear: External ear normal.     Left Ear: External ear normal.     Mouth/Throat:     Mouth: Mucous membranes are moist.  Eyes:     General: No scleral icterus.    Extraocular Movements: Extraocular movements intact.     Pupils: Pupils are equal, round, and reactive to light.  Cardiovascular:     Rate and Rhythm: Normal rate and regular rhythm.     Pulses: Normal pulses.     Heart sounds: Normal heart sounds.  Pulmonary:     Effort: Pulmonary effort is normal. No respiratory distress.     Breath sounds: Normal breath sounds.   Abdominal:     General: Abdomen is flat.     Palpations: Abdomen is soft.     Tenderness: There is no abdominal tenderness.  Musculoskeletal:     Right lower leg: No edema.     Left lower leg: No edema.  Skin:    General: Skin is warm and dry.     Capillary Refill: Capillary refill takes less  than 2 seconds.  Neurological:     Mental Status: He is alert and oriented to person, place, and time.     GCS: GCS eye subscore is 4. GCS verbal subscore is 5. GCS motor subscore is 6.     Cranial Nerves: Cranial nerves 2-12 are intact.     Sensory: Sensory deficit present.     Motor: Motor function is intact. No tremor.     Coordination: Coordination is intact.     Comments: Left sided reduced sensation,- baseline   Gait not tested 2/2 pt safety   Psychiatric:        Mood and Affect: Mood normal.        Behavior: Behavior normal.     ED Results and Treatments Labs (all labs ordered are listed, but only abnormal results are displayed) Labs Reviewed  COMPREHENSIVE METABOLIC PANEL - Abnormal; Notable for the following components:      Result Value   Chloride 95 (*)    Glucose, Bld 100 (*)    GFR, Estimated 58 (*)    All other components within normal limits  T4, FREE - Abnormal; Notable for the following components:   Free T4 1.27 (*)    All other components within normal limits  URINALYSIS, ROUTINE W REFLEX MICROSCOPIC - Abnormal; Notable for the following components:   Color, Urine STRAW (*)    Specific Gravity, Urine 1.004 (*)    All other components within normal limits  CBC WITH DIFFERENTIAL/PLATELET  TSH  CK  TROPONIN I (HIGH SENSITIVITY)  TROPONIN I (HIGH SENSITIVITY)                                                                                                                          Radiology CT Head Wo Contrast  Result Date: 04/11/2022 CLINICAL DATA:  Altered level of consciousness, memory loss EXAM: CT HEAD WITHOUT CONTRAST TECHNIQUE: Contiguous axial images  were obtained from the base of the skull through the vertex without intravenous contrast. RADIATION DOSE REDUCTION: This exam was performed according to the departmental dose-optimization program which includes automated exposure control, adjustment of the mA and/or kV according to patient size and/or use of iterative reconstruction technique. COMPARISON:  03/18/2022 FINDINGS: Brain: Stable chronic ischemic changes within the periventricular white matter, cerebellum, and bilateral occipital lobes. No acute infarct or hemorrhage. Lateral ventricles and remaining midline structures are unremarkable. No acute extra-axial fluid collections. No mass effect. Vascular: No hyperdense vessel or unexpected calcification. Skull: Normal. Negative for fracture or focal lesion. Sinuses/Orbits: Opacification of the right maxillary sinus. Remaining paranasal sinuses are clear. Other: None. IMPRESSION: 1. Stable chronic ischemic changes as above. No acute intracranial process. 2. Chronic right maxillary sinus disease. Electronically Signed   By: Randa Ngo M.D.   On: 04/11/2022 23:38   DG Chest Portable 1 View  Result Date: 04/11/2022 CLINICAL DATA:  Altered mental status EXAM: PORTABLE CHEST 1 VIEW COMPARISON:  03/18/2022 FINDINGS: Mildly increased opacity at the medial right  lung base. Lungs are otherwise clear. Normal cardiomediastinal contours. IMPRESSION: Mildly increased opacity at the medial right lung base may represent atelectasis, aspiration or infection. Electronically Signed   By: Ulyses Jarred M.D.   On: 04/11/2022 22:01    Pertinent labs & imaging results that were available during my care of the patient were reviewed by me and considered in my medical decision making (see MDM for details).  Medications Ordered in ED Medications - No data to display                                                                                                                                    Procedures Procedures  (including critical care time)  Medical Decision Making / ED Course    Medical Decision Making:    DRELON PRINTY is a 89 y.o. male with past medical history as below, significant for hearing loss, hypertension, CVA with left-sided residual deficit who presents to the ED with complaint of transient alteration of awareness.. The complaint involves an extensive differential diagnosis and also carries with it a high risk of complications and morbidity.  Serious etiology was considered. Ddx includes but is not limited to: Differential diagnoses for altered mental status includes but is not exclusive to alcohol, illicit or prescription medications, intracranial pathology such as stroke, intracerebral hemorrhage, fever or infectious causes including sepsis, hypoxemia, uremia, trauma, endocrine related disorders such as diabetes, hypoglycemia, thyroid-related diseases, etc.   Complete initial physical exam performed, notably the patient  was NAD, NIHSS 0, neuro non-focal.    Reviewed and confirmed nursing documentation for past medical history, family history, social history.  Vital signs reviewed.      Patient remains asymptomatic, labs stable Chest X-ray abnormal, favor atelectasis, he has no respiratory complaints, no cough, no fever.  No leukocytosis, pneumonia seems unlikely at this time Pending CT head Pt with transient confusion that has since resolved, not a/w focal neuro deficit, no seizure like activity reported, symptoms resolved and is back to baseline. Unclear etiology.   Additional history obtained: -Additional history obtained from family -External records from outside source obtained and reviewed including: Chart review including previous notes, labs, imaging, consultation notes including home medications, prior ED visits, prior labs and imaging   Lab Tests: -I ordered, reviewed, and interpreted labs.   The pertinent results include:   Labs Reviewed   COMPREHENSIVE METABOLIC PANEL - Abnormal; Notable for the following components:      Result Value   Chloride 95 (*)    Glucose, Bld 100 (*)    GFR, Estimated 58 (*)    All other components within normal limits  T4, FREE - Abnormal; Notable for the following components:   Free T4 1.27 (*)    All other components within normal limits  URINALYSIS, ROUTINE W REFLEX MICROSCOPIC - Abnormal; Notable for the following components:   Color, Urine STRAW (*)    Specific Gravity,  Urine 1.004 (*)    All other components within normal limits  CBC WITH DIFFERENTIAL/PLATELET  TSH  CK  TROPONIN I (HIGH SENSITIVITY)  TROPONIN I (HIGH SENSITIVITY)    Notable for stable  EKG   EKG Interpretation  Date/Time:  Sunday April 11 2022 19:59:38 EDT Ventricular Rate:  58 PR Interval:  217 QRS Duration: 101 QT Interval:  472 QTC Calculation: 464 R Axis:   -67 Text Interpretation: Sinus rhythm Borderline prolonged PR interval Left anterior fascicular block Abnormal R-wave progression, late transition Confirmed by Gerlene Fee [phone]) on 04/11/2022 11:23:24 PM         Imaging Studies ordered: I ordered imaging studies including CXR CTH I independently visualized the following imaging with scope of interpretation limited to determining acute life threatening conditions related to emergency care; findings noted above, significant for CXR atelectasis. CTH pending  I independently visualized and interpreted imaging. I agree with the radiologist interpretation   Medicines ordered and prescription drug management: No orders of the defined types were placed in this encounter.   -I have reviewed the patients home medicines and have made adjustments as needed   Consultations Obtained: na   Cardiac Monitoring: The patient was maintained on a cardiac monitor.  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    Final Clinical Impression(s) / ED Diagnoses Final diagnoses:  Confusion     This chart was dictated using voice recognition software.  Despite best efforts to proofread,  errors can occur which can change the documentation meaning.    Jeanell Sparrow, DO 04/11/22 2342

## 2022-04-11 NOTE — ED Triage Notes (Addendum)
Chief Complaint  Patient presents with   Memory Loss   Pt presents to ED 30 via EMS for above complaint.  Caregiver reports memory issues around 0530 following dinner.  History of stroke with residual left arm/leg weakness.  Pt uses wheelchair at baseline.  Per EMS, pt able to answer questions "as appropriately as expected."  Upon assessment, pt A/O x2 for this RN, oriented to self and place.  When asked what day, pt answers Wednesday (currently Sunday) and when asked what month, pt answers April 1 (currently March 31).  However, pt states it is 2014.  Pt states he does not remember why he was brought to the hospital.  Pt denies falls, pain, dizziness, or nausea.  Pt just states he "feels off."

## 2022-04-12 NOTE — ED Notes (Signed)
PTAR present for transport at this time.  PTAR provided with AVS.  Pt assisted with changing back into his clothes.  Pt leaving ED in stable condition at this time via stretcher with all belongings, including keys for residence.

## 2022-04-14 ENCOUNTER — Other Ambulatory Visit: Payer: Self-pay | Admitting: Internal Medicine

## 2022-05-06 ENCOUNTER — Other Ambulatory Visit: Payer: Self-pay | Admitting: Internal Medicine

## 2022-07-07 ENCOUNTER — Other Ambulatory Visit: Payer: Self-pay | Admitting: Internal Medicine

## 2022-07-07 DIAGNOSIS — F419 Anxiety disorder, unspecified: Secondary | ICD-10-CM

## 2022-07-12 ENCOUNTER — Other Ambulatory Visit: Payer: Self-pay | Admitting: Internal Medicine

## 2022-07-12 DIAGNOSIS — F419 Anxiety disorder, unspecified: Secondary | ICD-10-CM

## 2022-07-13 NOTE — Telephone Encounter (Signed)
Jeff Lamb - it looks like Dr. Okey Dupre said last month that he needs to schedule his annual physical and that's why this was previously denied?

## 2022-07-13 NOTE — Telephone Encounter (Signed)
I will call in a 30 day supply if pt is able to schedule his annual. Thank you

## 2022-07-13 NOTE — Telephone Encounter (Signed)
Please refill for patient.

## 2022-08-06 ENCOUNTER — Ambulatory Visit: Payer: Medicare HMO | Admitting: Internal Medicine

## 2022-08-06 ENCOUNTER — Encounter: Payer: Self-pay | Admitting: Internal Medicine

## 2022-08-06 VITALS — BP 124/84 | HR 58 | Temp 98.3°F | Ht 70.0 in

## 2022-08-06 DIAGNOSIS — Z8673 Personal history of transient ischemic attack (TIA), and cerebral infarction without residual deficits: Secondary | ICD-10-CM | POA: Diagnosis not present

## 2022-08-06 DIAGNOSIS — F419 Anxiety disorder, unspecified: Secondary | ICD-10-CM | POA: Insufficient documentation

## 2022-08-06 DIAGNOSIS — Z86711 Personal history of pulmonary embolism: Secondary | ICD-10-CM

## 2022-08-06 DIAGNOSIS — H9193 Unspecified hearing loss, bilateral: Secondary | ICD-10-CM

## 2022-08-06 DIAGNOSIS — H905 Unspecified sensorineural hearing loss: Secondary | ICD-10-CM | POA: Diagnosis not present

## 2022-08-06 MED ORDER — ESCITALOPRAM OXALATE 10 MG PO TABS
10.0000 mg | ORAL_TABLET | Freq: Every morning | ORAL | 3 refills | Status: DC
  Filled 2022-09-14 – 2022-09-22 (×3): qty 30, 30d supply, fill #0
  Filled 2022-10-15 (×2): qty 30, 30d supply, fill #1
  Filled 2022-11-08: qty 30, 30d supply, fill #2
  Filled 2022-12-03: qty 30, 30d supply, fill #3
  Filled 2022-12-28 – 2022-12-30 (×2): qty 30, 30d supply, fill #4
  Filled 2023-01-17 – 2023-01-27 (×3): qty 30, 30d supply, fill #5
  Filled 2023-02-22: qty 30, 30d supply, fill #6
  Filled 2023-03-08 – 2023-03-22 (×3): qty 30, 30d supply, fill #7
  Filled 2023-03-28 – 2023-04-14 (×2): qty 30, 30d supply, fill #8
  Filled 2023-05-02 – 2023-05-10 (×2): qty 30, 30d supply, fill #9
  Filled 2023-05-30 – 2023-06-02 (×2): qty 30, 30d supply, fill #10

## 2022-08-06 NOTE — Assessment & Plan Note (Signed)
Uses wheelchair and stable. No new stroke since last visit. Needs to continue eliquis 5 mg BID and will do patient assistance forms.

## 2022-08-06 NOTE — Assessment & Plan Note (Signed)
Referral to audiology as hearing aids packed away in move and not accessible.

## 2022-08-06 NOTE — Progress Notes (Signed)
   Subjective:   Patient ID: Jeff Lamb, male    DOB: 02-04-1929, 89 y.o.   MRN: [id number]  HPI The patient is a 89 YO man coming in for follow up. Needs patient assistance forms for eliquis. Overall stable. Hearing aids were packed in storage during recent move this fall and wishes evaluation for more.  Review of Systems  Constitutional: Negative.   HENT:  Positive for hearing loss.   Eyes: Negative.   Respiratory:  Negative for cough, chest tightness and shortness of breath.   Cardiovascular:  Negative for chest pain, palpitations and leg swelling.  Gastrointestinal:  Negative for abdominal distention, abdominal pain, constipation, diarrhea, nausea and vomiting.  Musculoskeletal:  Positive for gait problem.  Skin: Negative.   Neurological:  Positive for weakness.  Psychiatric/Behavioral: Negative.      Objective:  Physical Exam Constitutional:      Appearance: He is well-developed.  HENT:     Head: Normocephalic and atraumatic.  Cardiovascular:     Rate and Rhythm: Normal rate and regular rhythm.  Pulmonary:     Effort: Pulmonary effort is normal. No respiratory distress.     Breath sounds: Normal breath sounds. No wheezing or rales.  Abdominal:     General: Bowel sounds are normal. There is no distension.     Palpations: Abdomen is soft.     Tenderness: There is no abdominal tenderness. There is no rebound.  Musculoskeletal:     Cervical back: Normal range of motion.  Skin:    General: Skin is warm and dry.  Neurological:     Mental Status: He is alert and oriented to person, place, and time. Mental status is at baseline.     Sensory: Sensory deficit present.     Motor: Weakness present.     Coordination: Coordination abnormal.     Vitals:   08/06/22 0929  BP: 124/84  Pulse: (!) 58  Temp: 98.3 F (36.8 C)  TempSrc: Oral  SpO2: 98%  Height: 5\' 10"  (1.778 m)    Assessment & Plan:

## 2022-08-06 NOTE — Assessment & Plan Note (Signed)
Well controlled on lexapro 10 mg daily and refilled for 1 year supply. Will continue.

## 2022-08-06 NOTE — Assessment & Plan Note (Signed)
Needs to continue on lifelong eliquis 5 mg BID and will do patient assistance forms to help.

## 2022-08-18 ENCOUNTER — Other Ambulatory Visit: Payer: Self-pay | Admitting: Internal Medicine

## 2022-09-08 ENCOUNTER — Other Ambulatory Visit (HOSPITAL_COMMUNITY): Payer: Self-pay

## 2022-09-08 MED FILL — Apixaban Tab 5 MG: ORAL | 90 days supply | Qty: 180 | Fill #0 | Status: CN

## 2022-09-14 ENCOUNTER — Other Ambulatory Visit: Payer: Self-pay

## 2022-09-14 MED FILL — Amlodipine Besylate Tab 2.5 MG (Base Equivalent): ORAL | 30 days supply | Qty: 30 | Fill #0 | Status: CN

## 2022-09-14 MED FILL — Gabapentin Cap 100 MG: ORAL | 30 days supply | Qty: 30 | Fill #0 | Status: CN

## 2022-09-14 MED FILL — Apixaban Tab 5 MG: ORAL | 30 days supply | Qty: 60 | Fill #0 | Status: CN

## 2022-09-14 MED FILL — Potassium Chloride Microencapsulated Crys ER Tab 10 mEq: ORAL | 30 days supply | Qty: 30 | Fill #0 | Status: CN

## 2022-09-14 MED FILL — Hydrochlorothiazide Tab 25 MG: ORAL | 30 days supply | Qty: 30 | Fill #0 | Status: CN

## 2022-09-14 MED FILL — Atorvastatin Calcium Tab 10 MG (Base Equivalent): ORAL | 30 days supply | Qty: 30 | Fill #0 | Status: CN

## 2022-09-15 ENCOUNTER — Other Ambulatory Visit: Payer: Self-pay

## 2022-09-16 ENCOUNTER — Other Ambulatory Visit: Payer: Self-pay

## 2022-09-17 ENCOUNTER — Telehealth: Payer: Self-pay | Admitting: Internal Medicine

## 2022-09-17 ENCOUNTER — Other Ambulatory Visit: Payer: Self-pay

## 2022-09-17 ENCOUNTER — Other Ambulatory Visit (HOSPITAL_COMMUNITY): Payer: Self-pay

## 2022-09-18 ENCOUNTER — Other Ambulatory Visit: Payer: Self-pay

## 2022-09-20 ENCOUNTER — Other Ambulatory Visit: Payer: Self-pay

## 2022-09-20 ENCOUNTER — Telehealth: Payer: Self-pay | Admitting: Internal Medicine

## 2022-09-20 ENCOUNTER — Other Ambulatory Visit (HOSPITAL_COMMUNITY): Payer: Self-pay

## 2022-09-20 MED FILL — Potassium Chloride Microencapsulated Crys ER Tab 10 mEq: ORAL | 30 days supply | Qty: 90 | Fill #0 | Status: CN

## 2022-09-20 MED FILL — Atorvastatin Calcium Tab 10 MG (Base Equivalent): ORAL | 30 days supply | Qty: 30 | Fill #0 | Status: CN

## 2022-09-20 MED FILL — Hydrochlorothiazide Tab 25 MG: ORAL | 30 days supply | Qty: 30 | Fill #0 | Status: CN

## 2022-09-20 MED FILL — Gabapentin Cap 100 MG: ORAL | 30 days supply | Qty: 30 | Fill #0 | Status: CN

## 2022-09-20 MED FILL — Amlodipine Besylate Tab 2.5 MG (Base Equivalent): ORAL | 30 days supply | Qty: 30 | Fill #0 | Status: CN

## 2022-09-20 NOTE — Telephone Encounter (Signed)
Prescription Request  09/20/2022  LOV: 08/06/2022  What is the name of the medication or equipment? Eliquis, potassium cl, escitalopram, amlodopine, gabapentin, atorvastatin,   Have you contacted your pharmacy to request a refill? Yes   Which pharmacy would you like this sent to?  [facility] [address] [place] [address] Phone: [phone] Fax: [phone]    Patient notified that their request is being sent to the clinical staff for review and that they should receive a response within 2 business days.   Please advise at Mobile [phone] (mobile)

## 2022-09-22 ENCOUNTER — Other Ambulatory Visit: Payer: Self-pay

## 2022-09-22 ENCOUNTER — Other Ambulatory Visit (HOSPITAL_BASED_OUTPATIENT_CLINIC_OR_DEPARTMENT_OTHER): Payer: Self-pay

## 2022-09-22 ENCOUNTER — Other Ambulatory Visit (HOSPITAL_COMMUNITY): Payer: Self-pay

## 2022-09-22 MED ORDER — ATORVASTATIN CALCIUM 10 MG PO TABS
10.0000 mg | ORAL_TABLET | Freq: Every day | ORAL | 0 refills | Status: DC
  Filled 2022-09-22 (×2): qty 30, 30d supply, fill #0
  Filled 2022-10-15 (×2): qty 30, 30d supply, fill #1
  Filled 2022-11-08: qty 30, 30d supply, fill #2

## 2022-09-22 MED ORDER — AMLODIPINE BESYLATE 2.5 MG PO TABS
2.5000 mg | ORAL_TABLET | Freq: Every morning | ORAL | 0 refills | Status: DC
  Filled 2022-09-22 (×2): qty 30, 30d supply, fill #0
  Filled 2022-10-06 – 2022-10-15 (×3): qty 30, 30d supply, fill #1
  Filled 2022-11-08: qty 30, 30d supply, fill #2

## 2022-09-22 MED FILL — Apixaban Tab 5 MG: ORAL | 30 days supply | Qty: 60 | Fill #0 | Status: CN

## 2022-09-22 MED FILL — Gabapentin Cap 100 MG: ORAL | 30 days supply | Qty: 30 | Fill #0 | Status: AC

## 2022-09-22 MED FILL — Potassium Chloride Microencapsulated Crys ER Tab 10 mEq: ORAL | 30 days supply | Qty: 30 | Fill #0 | Status: AC

## 2022-09-22 MED FILL — Apixaban Tab 5 MG: ORAL | 30 days supply | Qty: 60 | Fill #0 | Status: AC

## 2022-09-22 MED FILL — Hydrochlorothiazide Tab 25 MG: ORAL | 30 days supply | Qty: 30 | Fill #0 | Status: AC

## 2022-09-22 NOTE — Telephone Encounter (Signed)
Gabapentin not due for refill until end of Oct all others non controlled. Some look like refill is not needed. Any due for refill can be filled.

## 2022-09-22 NOTE — Telephone Encounter (Signed)
Ok to send in refill for patient?

## 2022-10-06 ENCOUNTER — Other Ambulatory Visit: Payer: Self-pay

## 2022-10-06 ENCOUNTER — Other Ambulatory Visit: Payer: Self-pay | Admitting: Internal Medicine

## 2022-10-07 ENCOUNTER — Other Ambulatory Visit: Payer: Self-pay

## 2022-10-07 ENCOUNTER — Other Ambulatory Visit (HOSPITAL_COMMUNITY): Payer: Self-pay

## 2022-10-07 MED ORDER — HYDROCHLOROTHIAZIDE 25 MG PO TABS
25.0000 mg | ORAL_TABLET | Freq: Every day | ORAL | 1 refills | Status: DC
  Filled 2022-10-07: qty 90, 90d supply, fill #0
  Filled 2022-10-15 (×2): qty 30, 30d supply, fill #0
  Filled 2022-11-08: qty 30, 30d supply, fill #1
  Filled 2022-12-03: qty 30, 30d supply, fill #2
  Filled 2022-12-28 – 2022-12-30 (×2): qty 30, 30d supply, fill #3
  Filled 2023-01-17 – 2023-01-27 (×3): qty 30, 30d supply, fill #4
  Filled 2023-02-22: qty 30, 30d supply, fill #5

## 2022-10-15 ENCOUNTER — Other Ambulatory Visit (HOSPITAL_COMMUNITY): Payer: Self-pay

## 2022-10-15 ENCOUNTER — Other Ambulatory Visit: Payer: Self-pay

## 2022-10-15 MED FILL — Potassium Chloride Microencapsulated Crys ER Tab 10 mEq: ORAL | 30 days supply | Qty: 30 | Fill #1 | Status: CN

## 2022-10-15 MED FILL — Apixaban Tab 5 MG: ORAL | 30 days supply | Qty: 60 | Fill #1 | Status: CN

## 2022-10-15 MED FILL — Gabapentin Cap 100 MG: ORAL | 30 days supply | Qty: 30 | Fill #1 | Status: CN

## 2022-10-15 MED FILL — Apixaban Tab 5 MG: ORAL | 30 days supply | Qty: 60 | Fill #1 | Status: AC

## 2022-10-15 MED FILL — Gabapentin Cap 100 MG: ORAL | 30 days supply | Qty: 30 | Fill #1 | Status: AC

## 2022-10-15 MED FILL — Potassium Chloride Microencapsulated Crys ER Tab 10 mEq: ORAL | 30 days supply | Qty: 30 | Fill #1 | Status: AC

## 2022-11-08 ENCOUNTER — Other Ambulatory Visit: Payer: Self-pay | Admitting: Internal Medicine

## 2022-11-08 ENCOUNTER — Other Ambulatory Visit (HOSPITAL_COMMUNITY): Payer: Self-pay

## 2022-11-08 ENCOUNTER — Other Ambulatory Visit: Payer: Self-pay

## 2022-11-08 MED ORDER — GABAPENTIN 100 MG PO CAPS
100.0000 mg | ORAL_CAPSULE | Freq: Every evening | ORAL | 1 refills | Status: DC
  Filled 2022-11-08: qty 30, 30d supply, fill #0
  Filled 2022-12-03: qty 30, 30d supply, fill #1
  Filled 2022-12-28 – 2022-12-30 (×2): qty 30, 30d supply, fill #2
  Filled 2023-01-17 – 2023-01-27 (×3): qty 30, 30d supply, fill #3
  Filled 2023-02-22: qty 30, 30d supply, fill #4
  Filled 2023-03-08 – 2023-03-22 (×3): qty 30, 30d supply, fill #5

## 2022-11-08 MED FILL — Apixaban Tab 5 MG: ORAL | 30 days supply | Qty: 60 | Fill #2 | Status: AC

## 2022-11-08 MED FILL — Potassium Chloride Microencapsulated Crys ER Tab 10 mEq: ORAL | 30 days supply | Qty: 30 | Fill #2 | Status: AC

## 2022-11-19 ENCOUNTER — Encounter: Payer: Self-pay | Admitting: Internal Medicine

## 2022-11-19 ENCOUNTER — Ambulatory Visit (INDEPENDENT_AMBULATORY_CARE_PROVIDER_SITE_OTHER): Payer: Medicare HMO | Admitting: Internal Medicine

## 2022-11-19 VITALS — BP 140/80 | HR 60 | Temp 98.3°F | Ht 70.0 in | Wt 156.0 lb

## 2022-11-19 DIAGNOSIS — I10 Essential (primary) hypertension: Secondary | ICD-10-CM | POA: Diagnosis not present

## 2022-11-19 DIAGNOSIS — E782 Mixed hyperlipidemia: Secondary | ICD-10-CM | POA: Diagnosis not present

## 2022-11-19 DIAGNOSIS — Z86711 Personal history of pulmonary embolism: Secondary | ICD-10-CM

## 2022-11-19 DIAGNOSIS — Z8673 Personal history of transient ischemic attack (TIA), and cerebral infarction without residual deficits: Secondary | ICD-10-CM | POA: Diagnosis not present

## 2022-11-19 DIAGNOSIS — Z0000 Encounter for general adult medical examination without abnormal findings: Secondary | ICD-10-CM

## 2022-11-19 DIAGNOSIS — R7303 Prediabetes: Secondary | ICD-10-CM | POA: Diagnosis not present

## 2022-11-19 LAB — LIPID PANEL
Cholesterol: 126 mg/dL (ref 0–200)
HDL: 68.6 mg/dL (ref >39.00)
LDL Cholesterol: 48 mg/dL (ref 0–99)
NonHDL: 57.08
Total CHOL/HDL Ratio: 2
Triglycerides: 47 mg/dL (ref 0.0–149.0)
VLDL: 9.4 mg/dL (ref 0.0–40.0)

## 2022-11-19 LAB — COMPREHENSIVE METABOLIC PANEL
ALT: 14 U/L (ref 0–53)
AST: 17 U/L (ref 0–37)
Albumin: 4.2 g/dL (ref 3.5–5.2)
Alkaline Phosphatase: 53 U/L (ref 39–117)
BUN: 15 mg/dL (ref 6–23)
CO2: 36 meq/L — ABNORMAL HIGH (ref 19–32)
Calcium: 9.7 mg/dL (ref 8.4–10.5)
Chloride: 99 meq/L (ref 96–112)
Creatinine, Ser: 1.16 mg/dL (ref 0.40–1.50)
GFR: 55.02 mL/min — ABNORMAL LOW (ref >60.00)
Glucose, Bld: 94 mg/dL (ref 70–99)
Potassium: 4.5 meq/L (ref 3.5–5.1)
Sodium: 139 meq/L (ref 135–145)
Total Bilirubin: 1.1 mg/dL (ref 0.2–1.2)
Total Protein: 7.7 g/dL (ref 6.0–8.3)

## 2022-11-19 LAB — CBC
HCT: 43.2 % (ref 39.0–52.0)
Hemoglobin: 14.3 g/dL (ref 13.0–17.0)
MCHC: 33.2 g/dL (ref 30.0–36.0)
MCV: 96.3 fL (ref 78.0–100.0)
Platelets: 193 10*3/uL (ref 150.0–400.0)
RBC: 4.49 Mil/uL (ref 4.22–5.81)
RDW: 13.3 % (ref 11.5–15.5)
WBC: 6 10*3/uL (ref 4.0–10.5)

## 2022-11-19 LAB — HEMOGLOBIN A1C: Hgb A1c MFr Bld: 6.1 % (ref 4.6–6.5)

## 2022-11-19 NOTE — Progress Notes (Signed)
   Subjective:   Patient ID: Jeff Lamb, male    DOB: 1929-11-24, 89 y.o.   MRN: [id number]  HPI The patient is here for physical.  PMH, [facility], social history reviewed and updated  Review of Systems  Constitutional:  Positive for activity change.  HENT:  Positive for hearing loss.   Eyes: Negative.   Respiratory:  Negative for cough, chest tightness and shortness of breath.   Cardiovascular:  Negative for chest pain, palpitations and leg swelling.  Gastrointestinal:  Negative for abdominal distention, abdominal pain, constipation, diarrhea, nausea and vomiting.  Musculoskeletal:  Positive for gait problem.  Skin: Negative.   Neurological:  Positive for weakness and numbness.  Psychiatric/Behavioral: Negative.      Objective:  Physical Exam Constitutional:      Appearance: He is well-developed.  HENT:     Head: Normocephalic and atraumatic.  Cardiovascular:     Rate and Rhythm: Normal rate and regular rhythm.  Pulmonary:     Effort: Pulmonary effort is normal. No respiratory distress.     Breath sounds: Normal breath sounds. No wheezing or rales.  Abdominal:     General: Bowel sounds are normal. There is no distension.     Palpations: Abdomen is soft.     Tenderness: There is no abdominal tenderness. There is no rebound.  Musculoskeletal:     Cervical back: Normal range of motion.  Skin:    General: Skin is warm and dry.  Neurological:     Mental Status: He is alert and oriented to person, place, and time.     Cranial Nerves: Cranial nerve deficit present.     Sensory: Sensory deficit present.     Motor: Weakness present.     Coordination: Coordination abnormal.     Comments: Stable from prior, wheelchair manual for ambulation     Vitals:   11/19/22 1547 11/19/22 1548  BP: (!) 140/80 (!) 140/80  Pulse: 60   Temp: 98.3 F (36.8 C)   TempSrc: Oral   SpO2: 95%   Weight: 156 lb (70.8 kg)   Height: 5\' 10"  (1.778 m)     Assessment & Plan:

## 2022-11-19 NOTE — Patient Instructions (Addendum)
You can call to do the hearing test: [facility] [address] [place]  [phone]

## 2022-11-19 NOTE — Assessment & Plan Note (Signed)
Taking eliquis 5 mg BID for prevention. No clinical signs of bleeding.

## 2022-11-19 NOTE — Assessment & Plan Note (Signed)
BP at goal on amlodipine 2.5 mg daily and hydrochlorothiazide 25 mg daily checking CMP and adjust as needed.

## 2022-11-19 NOTE — Assessment & Plan Note (Signed)
Checking HgA1c and adjust as needed.  

## 2022-11-19 NOTE — Assessment & Plan Note (Signed)
Checking lipid panel and HgA1c. No new stroke symptoms.

## 2022-11-19 NOTE — Assessment & Plan Note (Signed)
Checking lipid panel and adjust as needed lipitor 10 mg daily.

## 2022-11-19 NOTE — Assessment & Plan Note (Signed)
Flu shot complete for season. Pneumonia complete. Shingrix due at pharmacy. Tetanus due 2028. Colonoscopy aged out. Counseled about sun safety and mole surveillance. Counseled about the dangers of distracted driving. Given 10 year screening recommendations.

## 2022-12-03 ENCOUNTER — Other Ambulatory Visit (HOSPITAL_COMMUNITY): Payer: Self-pay

## 2022-12-03 ENCOUNTER — Other Ambulatory Visit: Payer: Self-pay | Admitting: Internal Medicine

## 2022-12-03 ENCOUNTER — Other Ambulatory Visit: Payer: Self-pay

## 2022-12-03 MED FILL — Apixaban Tab 5 MG: ORAL | 30 days supply | Qty: 60 | Fill #3 | Status: AC

## 2022-12-03 MED FILL — Potassium Chloride Microencapsulated Crys ER Tab 10 mEq: ORAL | 30 days supply | Qty: 30 | Fill #3 | Status: AC

## 2022-12-06 ENCOUNTER — Other Ambulatory Visit (HOSPITAL_COMMUNITY): Payer: Self-pay

## 2022-12-06 ENCOUNTER — Other Ambulatory Visit: Payer: Self-pay

## 2022-12-06 MED ORDER — AMLODIPINE BESYLATE 2.5 MG PO TABS
2.5000 mg | ORAL_TABLET | Freq: Every morning | ORAL | 0 refills | Status: DC
  Filled 2022-12-06: qty 30, 30d supply, fill #0
  Filled 2022-12-28 – 2022-12-30 (×2): qty 30, 30d supply, fill #1
  Filled 2023-01-17 – 2023-01-27 (×3): qty 30, 30d supply, fill #2

## 2022-12-06 MED ORDER — ATORVASTATIN CALCIUM 10 MG PO TABS
10.0000 mg | ORAL_TABLET | Freq: Every day | ORAL | 0 refills | Status: DC
  Filled 2022-12-06: qty 30, 30d supply, fill #0
  Filled 2022-12-28 – 2022-12-30 (×2): qty 30, 30d supply, fill #1
  Filled 2023-01-17 – 2023-01-27 (×3): qty 30, 30d supply, fill #2

## 2022-12-07 ENCOUNTER — Other Ambulatory Visit: Payer: Self-pay

## 2022-12-15 DIAGNOSIS — M62512 Muscle wasting and atrophy, not elsewhere classified, left shoulder: Secondary | ICD-10-CM | POA: Diagnosis not present

## 2022-12-15 DIAGNOSIS — I69354 Hemiplegia and hemiparesis following cerebral infarction affecting left non-dominant side: Secondary | ICD-10-CM | POA: Diagnosis not present

## 2022-12-15 DIAGNOSIS — R296 Repeated falls: Secondary | ICD-10-CM | POA: Diagnosis not present

## 2022-12-17 DIAGNOSIS — R296 Repeated falls: Secondary | ICD-10-CM | POA: Diagnosis not present

## 2022-12-17 DIAGNOSIS — I69354 Hemiplegia and hemiparesis following cerebral infarction affecting left non-dominant side: Secondary | ICD-10-CM | POA: Diagnosis not present

## 2022-12-17 DIAGNOSIS — M62512 Muscle wasting and atrophy, not elsewhere classified, left shoulder: Secondary | ICD-10-CM | POA: Diagnosis not present

## 2022-12-21 DIAGNOSIS — R296 Repeated falls: Secondary | ICD-10-CM | POA: Diagnosis not present

## 2022-12-21 DIAGNOSIS — M62512 Muscle wasting and atrophy, not elsewhere classified, left shoulder: Secondary | ICD-10-CM | POA: Diagnosis not present

## 2022-12-21 DIAGNOSIS — I69354 Hemiplegia and hemiparesis following cerebral infarction affecting left non-dominant side: Secondary | ICD-10-CM | POA: Diagnosis not present

## 2022-12-24 DIAGNOSIS — R296 Repeated falls: Secondary | ICD-10-CM | POA: Diagnosis not present

## 2022-12-24 DIAGNOSIS — M62512 Muscle wasting and atrophy, not elsewhere classified, left shoulder: Secondary | ICD-10-CM | POA: Diagnosis not present

## 2022-12-24 DIAGNOSIS — I69354 Hemiplegia and hemiparesis following cerebral infarction affecting left non-dominant side: Secondary | ICD-10-CM | POA: Diagnosis not present

## 2022-12-28 ENCOUNTER — Other Ambulatory Visit: Payer: Self-pay

## 2022-12-28 ENCOUNTER — Other Ambulatory Visit (HOSPITAL_COMMUNITY): Payer: Self-pay

## 2022-12-28 DIAGNOSIS — M62512 Muscle wasting and atrophy, not elsewhere classified, left shoulder: Secondary | ICD-10-CM | POA: Diagnosis not present

## 2022-12-28 DIAGNOSIS — I69354 Hemiplegia and hemiparesis following cerebral infarction affecting left non-dominant side: Secondary | ICD-10-CM | POA: Diagnosis not present

## 2022-12-28 DIAGNOSIS — R296 Repeated falls: Secondary | ICD-10-CM | POA: Diagnosis not present

## 2022-12-28 MED FILL — Potassium Chloride Microencapsulated Crys ER Tab 10 mEq: ORAL | 30 days supply | Qty: 30 | Fill #4 | Status: CN

## 2022-12-28 MED FILL — Apixaban Tab 5 MG: ORAL | 30 days supply | Qty: 60 | Fill #4 | Status: CN

## 2022-12-29 DIAGNOSIS — M62512 Muscle wasting and atrophy, not elsewhere classified, left shoulder: Secondary | ICD-10-CM | POA: Diagnosis not present

## 2022-12-29 DIAGNOSIS — R296 Repeated falls: Secondary | ICD-10-CM | POA: Diagnosis not present

## 2022-12-29 DIAGNOSIS — I69354 Hemiplegia and hemiparesis following cerebral infarction affecting left non-dominant side: Secondary | ICD-10-CM | POA: Diagnosis not present

## 2022-12-30 ENCOUNTER — Other Ambulatory Visit: Payer: Self-pay

## 2022-12-30 MED FILL — Apixaban Tab 5 MG: ORAL | 30 days supply | Qty: 60 | Fill #4 | Status: AC

## 2022-12-30 MED FILL — Potassium Chloride Microencapsulated Crys ER Tab 10 mEq: ORAL | 30 days supply | Qty: 30 | Fill #4 | Status: AC

## 2022-12-31 DIAGNOSIS — R296 Repeated falls: Secondary | ICD-10-CM | POA: Diagnosis not present

## 2022-12-31 DIAGNOSIS — I69354 Hemiplegia and hemiparesis following cerebral infarction affecting left non-dominant side: Secondary | ICD-10-CM | POA: Diagnosis not present

## 2022-12-31 DIAGNOSIS — M62512 Muscle wasting and atrophy, not elsewhere classified, left shoulder: Secondary | ICD-10-CM | POA: Diagnosis not present

## 2023-01-03 DIAGNOSIS — I69354 Hemiplegia and hemiparesis following cerebral infarction affecting left non-dominant side: Secondary | ICD-10-CM | POA: Diagnosis not present

## 2023-01-03 DIAGNOSIS — M62512 Muscle wasting and atrophy, not elsewhere classified, left shoulder: Secondary | ICD-10-CM | POA: Diagnosis not present

## 2023-01-03 DIAGNOSIS — R296 Repeated falls: Secondary | ICD-10-CM | POA: Diagnosis not present

## 2023-01-11 DIAGNOSIS — M62512 Muscle wasting and atrophy, not elsewhere classified, left shoulder: Secondary | ICD-10-CM | POA: Diagnosis not present

## 2023-01-11 DIAGNOSIS — I69354 Hemiplegia and hemiparesis following cerebral infarction affecting left non-dominant side: Secondary | ICD-10-CM | POA: Diagnosis not present

## 2023-01-11 DIAGNOSIS — R296 Repeated falls: Secondary | ICD-10-CM | POA: Diagnosis not present

## 2023-01-13 DIAGNOSIS — M62512 Muscle wasting and atrophy, not elsewhere classified, left shoulder: Secondary | ICD-10-CM | POA: Diagnosis not present

## 2023-01-13 DIAGNOSIS — R296 Repeated falls: Secondary | ICD-10-CM | POA: Diagnosis not present

## 2023-01-13 DIAGNOSIS — I69354 Hemiplegia and hemiparesis following cerebral infarction affecting left non-dominant side: Secondary | ICD-10-CM | POA: Diagnosis not present

## 2023-01-14 DIAGNOSIS — I69354 Hemiplegia and hemiparesis following cerebral infarction affecting left non-dominant side: Secondary | ICD-10-CM | POA: Diagnosis not present

## 2023-01-14 DIAGNOSIS — M62512 Muscle wasting and atrophy, not elsewhere classified, left shoulder: Secondary | ICD-10-CM | POA: Diagnosis not present

## 2023-01-14 DIAGNOSIS — R296 Repeated falls: Secondary | ICD-10-CM | POA: Diagnosis not present

## 2023-01-17 ENCOUNTER — Other Ambulatory Visit: Payer: Self-pay

## 2023-01-17 DIAGNOSIS — R296 Repeated falls: Secondary | ICD-10-CM | POA: Diagnosis not present

## 2023-01-17 DIAGNOSIS — I69354 Hemiplegia and hemiparesis following cerebral infarction affecting left non-dominant side: Secondary | ICD-10-CM | POA: Diagnosis not present

## 2023-01-17 DIAGNOSIS — M62512 Muscle wasting and atrophy, not elsewhere classified, left shoulder: Secondary | ICD-10-CM | POA: Diagnosis not present

## 2023-01-17 MED FILL — Apixaban Tab 5 MG: ORAL | 30 days supply | Qty: 60 | Fill #5 | Status: CN

## 2023-01-17 MED FILL — Potassium Chloride Microencapsulated Crys ER Tab 10 mEq: ORAL | 30 days supply | Qty: 30 | Fill #5 | Status: CN

## 2023-01-19 ENCOUNTER — Other Ambulatory Visit: Payer: Self-pay

## 2023-01-20 DIAGNOSIS — I69354 Hemiplegia and hemiparesis following cerebral infarction affecting left non-dominant side: Secondary | ICD-10-CM | POA: Diagnosis not present

## 2023-01-20 DIAGNOSIS — R296 Repeated falls: Secondary | ICD-10-CM | POA: Diagnosis not present

## 2023-01-20 DIAGNOSIS — M62512 Muscle wasting and atrophy, not elsewhere classified, left shoulder: Secondary | ICD-10-CM | POA: Diagnosis not present

## 2023-01-21 ENCOUNTER — Other Ambulatory Visit: Payer: Self-pay

## 2023-01-24 ENCOUNTER — Other Ambulatory Visit: Payer: Self-pay

## 2023-01-24 DIAGNOSIS — M62512 Muscle wasting and atrophy, not elsewhere classified, left shoulder: Secondary | ICD-10-CM | POA: Diagnosis not present

## 2023-01-24 DIAGNOSIS — I69354 Hemiplegia and hemiparesis following cerebral infarction affecting left non-dominant side: Secondary | ICD-10-CM | POA: Diagnosis not present

## 2023-01-24 DIAGNOSIS — R296 Repeated falls: Secondary | ICD-10-CM | POA: Diagnosis not present

## 2023-01-24 MED FILL — Apixaban Tab 5 MG: ORAL | 30 days supply | Qty: 60 | Fill #5 | Status: CN

## 2023-01-24 MED FILL — Potassium Chloride Microencapsulated Crys ER Tab 10 mEq: ORAL | 30 days supply | Qty: 30 | Fill #5 | Status: CN

## 2023-01-25 ENCOUNTER — Other Ambulatory Visit (HOSPITAL_BASED_OUTPATIENT_CLINIC_OR_DEPARTMENT_OTHER): Payer: Self-pay

## 2023-01-25 ENCOUNTER — Other Ambulatory Visit: Payer: Self-pay

## 2023-01-26 ENCOUNTER — Other Ambulatory Visit: Payer: Self-pay

## 2023-01-26 DIAGNOSIS — R296 Repeated falls: Secondary | ICD-10-CM | POA: Diagnosis not present

## 2023-01-26 DIAGNOSIS — I69354 Hemiplegia and hemiparesis following cerebral infarction affecting left non-dominant side: Secondary | ICD-10-CM | POA: Diagnosis not present

## 2023-01-26 DIAGNOSIS — M62512 Muscle wasting and atrophy, not elsewhere classified, left shoulder: Secondary | ICD-10-CM | POA: Diagnosis not present

## 2023-01-27 ENCOUNTER — Other Ambulatory Visit (HOSPITAL_COMMUNITY): Payer: Self-pay

## 2023-01-27 ENCOUNTER — Other Ambulatory Visit: Payer: Self-pay

## 2023-01-27 MED FILL — Potassium Chloride Microencapsulated Crys ER Tab 10 mEq: ORAL | 30 days supply | Qty: 30 | Fill #5 | Status: AC

## 2023-01-27 MED FILL — Apixaban Tab 5 MG: ORAL | 30 days supply | Qty: 60 | Fill #5 | Status: AC

## 2023-02-01 DIAGNOSIS — M62512 Muscle wasting and atrophy, not elsewhere classified, left shoulder: Secondary | ICD-10-CM | POA: Diagnosis not present

## 2023-02-01 DIAGNOSIS — R296 Repeated falls: Secondary | ICD-10-CM | POA: Diagnosis not present

## 2023-02-01 DIAGNOSIS — I69354 Hemiplegia and hemiparesis following cerebral infarction affecting left non-dominant side: Secondary | ICD-10-CM | POA: Diagnosis not present

## 2023-02-03 DIAGNOSIS — I69354 Hemiplegia and hemiparesis following cerebral infarction affecting left non-dominant side: Secondary | ICD-10-CM | POA: Diagnosis not present

## 2023-02-03 DIAGNOSIS — R296 Repeated falls: Secondary | ICD-10-CM | POA: Diagnosis not present

## 2023-02-03 DIAGNOSIS — M62512 Muscle wasting and atrophy, not elsewhere classified, left shoulder: Secondary | ICD-10-CM | POA: Diagnosis not present

## 2023-02-07 DIAGNOSIS — M62512 Muscle wasting and atrophy, not elsewhere classified, left shoulder: Secondary | ICD-10-CM | POA: Diagnosis not present

## 2023-02-07 DIAGNOSIS — I69354 Hemiplegia and hemiparesis following cerebral infarction affecting left non-dominant side: Secondary | ICD-10-CM | POA: Diagnosis not present

## 2023-02-07 DIAGNOSIS — R296 Repeated falls: Secondary | ICD-10-CM | POA: Diagnosis not present

## 2023-02-08 ENCOUNTER — Ambulatory Visit: Payer: Medicare HMO | Admitting: Internal Medicine

## 2023-02-10 DIAGNOSIS — I69354 Hemiplegia and hemiparesis following cerebral infarction affecting left non-dominant side: Secondary | ICD-10-CM | POA: Diagnosis not present

## 2023-02-10 DIAGNOSIS — R296 Repeated falls: Secondary | ICD-10-CM | POA: Diagnosis not present

## 2023-02-10 DIAGNOSIS — M62512 Muscle wasting and atrophy, not elsewhere classified, left shoulder: Secondary | ICD-10-CM | POA: Diagnosis not present

## 2023-02-14 ENCOUNTER — Other Ambulatory Visit: Payer: Self-pay | Admitting: Internal Medicine

## 2023-02-14 ENCOUNTER — Other Ambulatory Visit: Payer: Self-pay

## 2023-02-14 ENCOUNTER — Other Ambulatory Visit (HOSPITAL_BASED_OUTPATIENT_CLINIC_OR_DEPARTMENT_OTHER): Payer: Self-pay

## 2023-02-14 MED ORDER — ATORVASTATIN CALCIUM 10 MG PO TABS
10.0000 mg | ORAL_TABLET | Freq: Every day | ORAL | 0 refills | Status: DC
  Filled 2023-02-22: qty 30, 30d supply, fill #0
  Filled 2023-03-08 – 2023-03-22 (×3): qty 30, 30d supply, fill #1
  Filled 2023-03-28 – 2023-04-14 (×2): qty 30, 30d supply, fill #2

## 2023-02-14 MED ORDER — AMLODIPINE BESYLATE 2.5 MG PO TABS
2.5000 mg | ORAL_TABLET | Freq: Every morning | ORAL | 0 refills | Status: DC
  Filled 2023-02-22: qty 30, 30d supply, fill #0
  Filled 2023-03-08 – 2023-03-22 (×3): qty 30, 30d supply, fill #1
  Filled 2023-03-28 – 2023-04-14 (×2): qty 30, 30d supply, fill #2

## 2023-02-15 DIAGNOSIS — R296 Repeated falls: Secondary | ICD-10-CM | POA: Diagnosis not present

## 2023-02-15 DIAGNOSIS — I69354 Hemiplegia and hemiparesis following cerebral infarction affecting left non-dominant side: Secondary | ICD-10-CM | POA: Diagnosis not present

## 2023-02-15 DIAGNOSIS — M62512 Muscle wasting and atrophy, not elsewhere classified, left shoulder: Secondary | ICD-10-CM | POA: Diagnosis not present

## 2023-02-17 ENCOUNTER — Ambulatory Visit: Payer: Medicare HMO | Admitting: Internal Medicine

## 2023-02-17 ENCOUNTER — Encounter: Payer: Self-pay | Admitting: Internal Medicine

## 2023-02-17 VITALS — BP 138/68 | HR 57 | Temp 98.4°F | Ht 70.0 in | Wt 149.0 lb

## 2023-02-17 DIAGNOSIS — R634 Abnormal weight loss: Secondary | ICD-10-CM | POA: Diagnosis not present

## 2023-02-17 NOTE — Patient Instructions (Signed)
 Try to add some snacks throughout the day and consider adding protein supplement either bar, cookie, liquid to help avoid muscle loss.

## 2023-02-17 NOTE — Progress Notes (Signed)
   Subjective:   Patient ID: Jeff Lamb, male    DOB: 1929-08-07, 89 y.o.   MRN: [id number]  HPI The patient is a 89 YO man coming in for weight loss and decrease in appetite.   Review of Systems  Constitutional:  Positive for appetite change and unexpected weight change.  HENT:  Positive for hearing loss.   Eyes: Negative.   Respiratory:  Negative for cough, chest tightness and shortness of breath.   Cardiovascular:  Negative for chest pain, palpitations and leg swelling.  Gastrointestinal:  Negative for abdominal distention, abdominal pain, constipation, diarrhea, nausea and vomiting.  Musculoskeletal:  Positive for gait problem.  Skin: Negative.   Psychiatric/Behavioral: Negative.      Objective:  Physical Exam Constitutional:      Appearance: He is well-developed.  HENT:     Head: Normocephalic and atraumatic.  Cardiovascular:     Rate and Rhythm: Normal rate and regular rhythm.  Pulmonary:     Effort: Pulmonary effort is normal. No respiratory distress.     Breath sounds: Normal breath sounds. No wheezing or rales.  Abdominal:     General: Bowel sounds are normal. There is no distension.     Palpations: Abdomen is soft.     Tenderness: There is no abdominal tenderness. There is no rebound.  Musculoskeletal:     Cervical back: Normal range of motion.  Skin:    General: Skin is warm and dry.  Neurological:     Mental Status: He is alert and oriented to person, place, and time.     Coordination: Coordination abnormal.     Comments: Wheelchair in office, cane at home     Vitals:   02/17/23 0907  BP: 138/68  Pulse: (!) 57  Temp: 98.4 F (36.9 C)  TempSrc: Oral  SpO2: 98%  Weight: 149 lb (67.6 kg)  Height: 5' 10 (1.778 m)    Assessment & Plan:

## 2023-02-17 NOTE — Assessment & Plan Note (Signed)
 Suspect related to poor appetite. He does not have any swallowing problems reported or stomach pain or bloating. There is no pain with eating and no foods getting stuck. There is no constipation or diarrhea. Advised smaller meals throughout the day and some protein supplement. Also discussed higher calories dense foods. Follow up if still losing weight without cause.

## 2023-02-18 DIAGNOSIS — M62512 Muscle wasting and atrophy, not elsewhere classified, left shoulder: Secondary | ICD-10-CM | POA: Diagnosis not present

## 2023-02-18 DIAGNOSIS — R296 Repeated falls: Secondary | ICD-10-CM | POA: Diagnosis not present

## 2023-02-18 DIAGNOSIS — I69354 Hemiplegia and hemiparesis following cerebral infarction affecting left non-dominant side: Secondary | ICD-10-CM | POA: Diagnosis not present

## 2023-02-21 DIAGNOSIS — R296 Repeated falls: Secondary | ICD-10-CM | POA: Diagnosis not present

## 2023-02-21 DIAGNOSIS — M62512 Muscle wasting and atrophy, not elsewhere classified, left shoulder: Secondary | ICD-10-CM | POA: Diagnosis not present

## 2023-02-21 DIAGNOSIS — I69354 Hemiplegia and hemiparesis following cerebral infarction affecting left non-dominant side: Secondary | ICD-10-CM | POA: Diagnosis not present

## 2023-02-22 ENCOUNTER — Other Ambulatory Visit: Payer: Self-pay

## 2023-02-22 MED FILL — Apixaban Tab 5 MG: ORAL | 30 days supply | Qty: 60 | Fill #6 | Status: AC

## 2023-02-22 MED FILL — Potassium Chloride Microencapsulated Crys ER Tab 10 mEq: ORAL | 30 days supply | Qty: 30 | Fill #6 | Status: AC

## 2023-02-23 ENCOUNTER — Other Ambulatory Visit: Payer: Self-pay

## 2023-02-28 DIAGNOSIS — R296 Repeated falls: Secondary | ICD-10-CM | POA: Diagnosis not present

## 2023-02-28 DIAGNOSIS — M62512 Muscle wasting and atrophy, not elsewhere classified, left shoulder: Secondary | ICD-10-CM | POA: Diagnosis not present

## 2023-02-28 DIAGNOSIS — I69354 Hemiplegia and hemiparesis following cerebral infarction affecting left non-dominant side: Secondary | ICD-10-CM | POA: Diagnosis not present

## 2023-03-07 DIAGNOSIS — I69354 Hemiplegia and hemiparesis following cerebral infarction affecting left non-dominant side: Secondary | ICD-10-CM | POA: Diagnosis not present

## 2023-03-07 DIAGNOSIS — R296 Repeated falls: Secondary | ICD-10-CM | POA: Diagnosis not present

## 2023-03-07 DIAGNOSIS — M62512 Muscle wasting and atrophy, not elsewhere classified, left shoulder: Secondary | ICD-10-CM | POA: Diagnosis not present

## 2023-03-08 ENCOUNTER — Other Ambulatory Visit: Payer: Self-pay

## 2023-03-08 ENCOUNTER — Other Ambulatory Visit: Payer: Self-pay | Admitting: Internal Medicine

## 2023-03-08 MED ORDER — POTASSIUM CHLORIDE CRYS ER 10 MEQ PO TBCR
10.0000 meq | EXTENDED_RELEASE_TABLET | Freq: Every day | ORAL | 2 refills | Status: DC
  Filled 2023-03-17: qty 90, 90d supply, fill #0
  Filled 2023-03-22: qty 30, 30d supply, fill #0
  Filled 2023-03-28 – 2023-04-14 (×2): qty 30, 30d supply, fill #1
  Filled 2023-05-02 – 2023-05-10 (×2): qty 30, 30d supply, fill #2
  Filled 2023-05-30 – 2023-06-02 (×2): qty 30, 30d supply, fill #3
  Filled 2023-07-12: qty 30, 30d supply, fill #4
  Filled 2023-08-09 (×2): qty 30, 30d supply, fill #5
  Filled 2023-09-06: qty 30, 30d supply, fill #6
  Filled 2023-10-06 – 2023-10-10 (×2): qty 30, 30d supply, fill #7
  Filled 2023-11-03: qty 30, 30d supply, fill #8

## 2023-03-08 MED ORDER — APIXABAN 5 MG PO TABS
5.0000 mg | ORAL_TABLET | Freq: Two times a day (BID) | ORAL | 1 refills | Status: DC
  Filled 2023-03-17: qty 180, 90d supply, fill #0
  Filled 2023-03-22: qty 60, 30d supply, fill #0
  Filled 2023-03-28 – 2023-04-14 (×2): qty 60, 30d supply, fill #1
  Filled 2023-05-02 – 2023-05-10 (×2): qty 60, 30d supply, fill #2
  Filled 2023-05-30 – 2023-06-02 (×2): qty 60, 30d supply, fill #3
  Filled 2023-07-12: qty 60, 30d supply, fill #4
  Filled 2023-08-09 (×2): qty 60, 30d supply, fill #5

## 2023-03-08 MED ORDER — HYDROCHLOROTHIAZIDE 25 MG PO TABS
25.0000 mg | ORAL_TABLET | Freq: Every day | ORAL | 1 refills | Status: DC
  Filled 2023-03-17: qty 90, 90d supply, fill #0
  Filled 2023-03-22: qty 30, 30d supply, fill #0
  Filled 2023-03-28 – 2023-04-14 (×2): qty 30, 30d supply, fill #1
  Filled 2023-05-02 – 2023-05-10 (×2): qty 30, 30d supply, fill #2
  Filled 2023-05-30 – 2023-06-02 (×2): qty 30, 30d supply, fill #3
  Filled 2023-07-12: qty 30, 30d supply, fill #4
  Filled 2023-08-09 (×2): qty 30, 30d supply, fill #5

## 2023-03-09 ENCOUNTER — Other Ambulatory Visit (HOSPITAL_COMMUNITY): Payer: Self-pay

## 2023-03-11 DIAGNOSIS — R296 Repeated falls: Secondary | ICD-10-CM | POA: Diagnosis not present

## 2023-03-11 DIAGNOSIS — M62512 Muscle wasting and atrophy, not elsewhere classified, left shoulder: Secondary | ICD-10-CM | POA: Diagnosis not present

## 2023-03-11 DIAGNOSIS — I69354 Hemiplegia and hemiparesis following cerebral infarction affecting left non-dominant side: Secondary | ICD-10-CM | POA: Diagnosis not present

## 2023-03-14 DIAGNOSIS — I69354 Hemiplegia and hemiparesis following cerebral infarction affecting left non-dominant side: Secondary | ICD-10-CM | POA: Diagnosis not present

## 2023-03-14 DIAGNOSIS — R296 Repeated falls: Secondary | ICD-10-CM | POA: Diagnosis not present

## 2023-03-14 DIAGNOSIS — M62512 Muscle wasting and atrophy, not elsewhere classified, left shoulder: Secondary | ICD-10-CM | POA: Diagnosis not present

## 2023-03-17 ENCOUNTER — Other Ambulatory Visit: Payer: Self-pay

## 2023-03-22 ENCOUNTER — Other Ambulatory Visit: Payer: Self-pay

## 2023-03-22 ENCOUNTER — Other Ambulatory Visit (HOSPITAL_BASED_OUTPATIENT_CLINIC_OR_DEPARTMENT_OTHER): Payer: Self-pay

## 2023-03-22 ENCOUNTER — Other Ambulatory Visit (HOSPITAL_COMMUNITY): Payer: Self-pay

## 2023-03-23 ENCOUNTER — Other Ambulatory Visit: Payer: Self-pay

## 2023-03-23 DIAGNOSIS — R296 Repeated falls: Secondary | ICD-10-CM | POA: Diagnosis not present

## 2023-03-23 DIAGNOSIS — M62512 Muscle wasting and atrophy, not elsewhere classified, left shoulder: Secondary | ICD-10-CM | POA: Diagnosis not present

## 2023-03-23 DIAGNOSIS — I69354 Hemiplegia and hemiparesis following cerebral infarction affecting left non-dominant side: Secondary | ICD-10-CM | POA: Diagnosis not present

## 2023-03-25 ENCOUNTER — Other Ambulatory Visit: Payer: Self-pay

## 2023-03-28 ENCOUNTER — Other Ambulatory Visit: Payer: Self-pay | Admitting: Internal Medicine

## 2023-03-28 ENCOUNTER — Other Ambulatory Visit: Payer: Self-pay

## 2023-03-28 DIAGNOSIS — I69354 Hemiplegia and hemiparesis following cerebral infarction affecting left non-dominant side: Secondary | ICD-10-CM | POA: Diagnosis not present

## 2023-03-28 DIAGNOSIS — M62512 Muscle wasting and atrophy, not elsewhere classified, left shoulder: Secondary | ICD-10-CM | POA: Diagnosis not present

## 2023-03-28 DIAGNOSIS — R296 Repeated falls: Secondary | ICD-10-CM | POA: Diagnosis not present

## 2023-03-29 ENCOUNTER — Other Ambulatory Visit: Payer: Self-pay

## 2023-03-29 ENCOUNTER — Other Ambulatory Visit (HOSPITAL_COMMUNITY): Payer: Self-pay

## 2023-03-29 MED ORDER — GABAPENTIN 100 MG PO CAPS
100.0000 mg | ORAL_CAPSULE | Freq: Every evening | ORAL | 1 refills | Status: DC
  Filled 2023-04-14: qty 30, 30d supply, fill #0
  Filled 2023-05-02 – 2023-05-10 (×2): qty 30, 30d supply, fill #1
  Filled 2023-05-30 – 2023-06-02 (×2): qty 30, 30d supply, fill #2
  Filled 2023-07-12: qty 30, 30d supply, fill #3
  Filled 2023-08-09 (×2): qty 30, 30d supply, fill #4
  Filled 2023-09-06: qty 30, 30d supply, fill #5

## 2023-04-04 DIAGNOSIS — R296 Repeated falls: Secondary | ICD-10-CM | POA: Diagnosis not present

## 2023-04-04 DIAGNOSIS — M62512 Muscle wasting and atrophy, not elsewhere classified, left shoulder: Secondary | ICD-10-CM | POA: Diagnosis not present

## 2023-04-04 DIAGNOSIS — I69354 Hemiplegia and hemiparesis following cerebral infarction affecting left non-dominant side: Secondary | ICD-10-CM | POA: Diagnosis not present

## 2023-04-12 DIAGNOSIS — R296 Repeated falls: Secondary | ICD-10-CM | POA: Diagnosis not present

## 2023-04-12 DIAGNOSIS — M62512 Muscle wasting and atrophy, not elsewhere classified, left shoulder: Secondary | ICD-10-CM | POA: Diagnosis not present

## 2023-04-12 DIAGNOSIS — I69354 Hemiplegia and hemiparesis following cerebral infarction affecting left non-dominant side: Secondary | ICD-10-CM | POA: Diagnosis not present

## 2023-04-13 DIAGNOSIS — I69354 Hemiplegia and hemiparesis following cerebral infarction affecting left non-dominant side: Secondary | ICD-10-CM | POA: Diagnosis not present

## 2023-04-13 DIAGNOSIS — M62512 Muscle wasting and atrophy, not elsewhere classified, left shoulder: Secondary | ICD-10-CM | POA: Diagnosis not present

## 2023-04-13 DIAGNOSIS — R296 Repeated falls: Secondary | ICD-10-CM | POA: Diagnosis not present

## 2023-04-14 ENCOUNTER — Other Ambulatory Visit: Payer: Self-pay

## 2023-04-14 ENCOUNTER — Other Ambulatory Visit (HOSPITAL_COMMUNITY): Payer: Self-pay

## 2023-05-02 ENCOUNTER — Other Ambulatory Visit: Payer: Self-pay | Admitting: Internal Medicine

## 2023-05-02 ENCOUNTER — Other Ambulatory Visit (HOSPITAL_COMMUNITY): Payer: Self-pay

## 2023-05-02 ENCOUNTER — Other Ambulatory Visit: Payer: Self-pay

## 2023-05-02 MED ORDER — AMLODIPINE BESYLATE 2.5 MG PO TABS
2.5000 mg | ORAL_TABLET | Freq: Every morning | ORAL | 0 refills | Status: DC
  Filled 2023-05-10: qty 30, 30d supply, fill #0
  Filled 2023-05-30 – 2023-06-02 (×2): qty 30, 30d supply, fill #1
  Filled 2023-07-12: qty 30, 30d supply, fill #2

## 2023-05-02 MED ORDER — ATORVASTATIN CALCIUM 10 MG PO TABS
10.0000 mg | ORAL_TABLET | Freq: Every day | ORAL | 0 refills | Status: DC
  Filled 2023-05-10: qty 30, 30d supply, fill #0
  Filled 2023-05-30 – 2023-06-02 (×2): qty 30, 30d supply, fill #1
  Filled 2023-07-12: qty 30, 30d supply, fill #2

## 2023-05-10 ENCOUNTER — Other Ambulatory Visit: Payer: Self-pay

## 2023-05-10 ENCOUNTER — Other Ambulatory Visit (HOSPITAL_COMMUNITY): Payer: Self-pay

## 2023-05-16 ENCOUNTER — Other Ambulatory Visit: Payer: Self-pay

## 2023-05-30 ENCOUNTER — Other Ambulatory Visit: Payer: Self-pay

## 2023-05-31 ENCOUNTER — Other Ambulatory Visit (HOSPITAL_COMMUNITY): Payer: Self-pay

## 2023-06-01 ENCOUNTER — Other Ambulatory Visit (HOSPITAL_COMMUNITY): Payer: Self-pay

## 2023-06-02 ENCOUNTER — Other Ambulatory Visit: Payer: Self-pay

## 2023-06-14 DIAGNOSIS — R4789 Other speech disturbances: Secondary | ICD-10-CM | POA: Diagnosis not present

## 2023-06-14 DIAGNOSIS — R488 Other symbolic dysfunctions: Secondary | ICD-10-CM | POA: Diagnosis not present

## 2023-06-15 DIAGNOSIS — R4789 Other speech disturbances: Secondary | ICD-10-CM | POA: Diagnosis not present

## 2023-06-15 DIAGNOSIS — R488 Other symbolic dysfunctions: Secondary | ICD-10-CM | POA: Diagnosis not present

## 2023-06-17 DIAGNOSIS — R488 Other symbolic dysfunctions: Secondary | ICD-10-CM | POA: Diagnosis not present

## 2023-06-17 DIAGNOSIS — R4789 Other speech disturbances: Secondary | ICD-10-CM | POA: Diagnosis not present

## 2023-06-20 DIAGNOSIS — R488 Other symbolic dysfunctions: Secondary | ICD-10-CM | POA: Diagnosis not present

## 2023-06-20 DIAGNOSIS — R4789 Other speech disturbances: Secondary | ICD-10-CM | POA: Diagnosis not present

## 2023-06-22 ENCOUNTER — Other Ambulatory Visit (HOSPITAL_COMMUNITY): Payer: Self-pay

## 2023-06-22 ENCOUNTER — Other Ambulatory Visit: Payer: Self-pay

## 2023-06-22 ENCOUNTER — Other Ambulatory Visit: Payer: Self-pay | Admitting: Internal Medicine

## 2023-06-22 DIAGNOSIS — R4789 Other speech disturbances: Secondary | ICD-10-CM | POA: Diagnosis not present

## 2023-06-22 DIAGNOSIS — R488 Other symbolic dysfunctions: Secondary | ICD-10-CM | POA: Diagnosis not present

## 2023-06-22 DIAGNOSIS — F419 Anxiety disorder, unspecified: Secondary | ICD-10-CM

## 2023-06-22 MED ORDER — ESCITALOPRAM OXALATE 10 MG PO TABS
10.0000 mg | ORAL_TABLET | Freq: Every morning | ORAL | 1 refills | Status: DC
  Filled 2023-07-12: qty 30, 30d supply, fill #0
  Filled 2023-08-09 (×2): qty 30, 30d supply, fill #1
  Filled 2023-09-06: qty 30, 30d supply, fill #2
  Filled 2023-10-06 – 2023-10-10 (×2): qty 30, 30d supply, fill #3
  Filled 2023-11-03: qty 30, 30d supply, fill #4
  Filled 2023-11-30: qty 30, 30d supply, fill #5

## 2023-06-24 ENCOUNTER — Ambulatory Visit (INDEPENDENT_AMBULATORY_CARE_PROVIDER_SITE_OTHER)

## 2023-06-24 VITALS — Ht 70.0 in | Wt 149.0 lb

## 2023-06-24 DIAGNOSIS — Z0000 Encounter for general adult medical examination without abnormal findings: Secondary | ICD-10-CM | POA: Diagnosis not present

## 2023-06-24 DIAGNOSIS — R488 Other symbolic dysfunctions: Secondary | ICD-10-CM | POA: Diagnosis not present

## 2023-06-24 DIAGNOSIS — Z0100 Encounter for examination of eyes and vision without abnormal findings: Secondary | ICD-10-CM

## 2023-06-24 DIAGNOSIS — R4789 Other speech disturbances: Secondary | ICD-10-CM | POA: Diagnosis not present
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
## 2023-06-24 NOTE — Patient Instructions (Signed)
 Jeff Lamb , Thank you for taking time out of your busy schedule to complete your Annual Wellness Visit with me. I enjoyed our conversation and look forward to speaking with you again next year. I, as well as your care team,  appreciate your ongoing commitment to your health goals. Please review the following plan we discussed and let me know if I can assist you in the future. Your Game plan/ To Do List   Follow up Visits: Next Medicare AWV with our clinical staff: 06/26/2024   Have you seen your provider in the last 6 months (3 months if uncontrolled diabetes)? Yes Next Office Visit with your provider: to be scheduled  Clinician Recommendations:  Aim for 30 minutes of exercise or brisk walking, 6-8 glasses of water, and 5 servings of fruits and vegetables each day. Educated and advised on getting the Shingles vaccines in 2025.      This is a list of the screening recommended for you and due dates:  Health Maintenance  Topic Date Due   Zoster (Shingles) Vaccine (1 of 2) Never done   COVID-19 Vaccine (4 - 2024-25 season) 05/10/2023   Flu Shot  08/12/2023   Medicare Annual Wellness Visit  06/23/2024   DTaP/Tdap/Td vaccine (3 - Td or Tdap) 09/25/2026   Pneumococcal Vaccine for age over 8  Completed   HPV Vaccine  Aged Out   Meningitis B Vaccine  Aged Out    Advanced directives: (Copy Requested) Please bring a copy of your health care power of attorney and living will to the office to be added to your chart at your convenience. You can mail to [facility] [address] [place], [place] [address] or email to [email or web address] Advance Care Planning is important because it:  [x]  Makes sure you receive the medical care that is consistent with your values, goals, and preferences  [x]  It provides guidance to your family and loved ones and reduces their decisional burden about whether or not they are making the right decisions based on your wishes.  Follow the link  provided in your after visit summary or read over the paperwork we have mailed to you to help you started getting your Advance Directives in place. If you need assistance in completing these, please reach out to us  so that we can help you!

## 2023-06-27 DIAGNOSIS — R4789 Other speech disturbances: Secondary | ICD-10-CM | POA: Diagnosis not present

## 2023-06-27 DIAGNOSIS — R488 Other symbolic dysfunctions: Secondary | ICD-10-CM | POA: Diagnosis not present

## 2023-06-29 DIAGNOSIS — R488 Other symbolic dysfunctions: Secondary | ICD-10-CM | POA: Diagnosis not present

## 2023-06-29 DIAGNOSIS — R4789 Other speech disturbances: Secondary | ICD-10-CM | POA: Diagnosis not present

## 2023-07-01 DIAGNOSIS — R4789 Other speech disturbances: Secondary | ICD-10-CM | POA: Diagnosis not present

## 2023-07-01 DIAGNOSIS — R488 Other symbolic dysfunctions: Secondary | ICD-10-CM | POA: Diagnosis not present

## 2023-07-04 DIAGNOSIS — R4789 Other speech disturbances: Secondary | ICD-10-CM | POA: Diagnosis not present

## 2023-07-04 DIAGNOSIS — R488 Other symbolic dysfunctions: Secondary | ICD-10-CM | POA: Diagnosis not present

## 2023-07-05 DIAGNOSIS — R4789 Other speech disturbances: Secondary | ICD-10-CM | POA: Diagnosis not present

## 2023-07-05 DIAGNOSIS — R488 Other symbolic dysfunctions: Secondary | ICD-10-CM | POA: Diagnosis not present

## 2023-07-06 DIAGNOSIS — R4789 Other speech disturbances: Secondary | ICD-10-CM | POA: Diagnosis not present

## 2023-07-06 DIAGNOSIS — R488 Other symbolic dysfunctions: Secondary | ICD-10-CM | POA: Diagnosis not present

## 2023-07-12 ENCOUNTER — Other Ambulatory Visit: Payer: Self-pay

## 2023-07-12 DIAGNOSIS — R4789 Other speech disturbances: Secondary | ICD-10-CM | POA: Diagnosis not present

## 2023-07-12 DIAGNOSIS — R488 Other symbolic dysfunctions: Secondary | ICD-10-CM | POA: Diagnosis not present

## 2023-07-13 DIAGNOSIS — R488 Other symbolic dysfunctions: Secondary | ICD-10-CM | POA: Diagnosis not present

## 2023-07-13 DIAGNOSIS — R4789 Other speech disturbances: Secondary | ICD-10-CM | POA: Diagnosis not present

## 2023-07-14 DIAGNOSIS — R488 Other symbolic dysfunctions: Secondary | ICD-10-CM | POA: Diagnosis not present

## 2023-07-14 DIAGNOSIS — R4789 Other speech disturbances: Secondary | ICD-10-CM | POA: Diagnosis not present

## 2023-07-18 DIAGNOSIS — R4789 Other speech disturbances: Secondary | ICD-10-CM | POA: Diagnosis not present

## 2023-07-18 DIAGNOSIS — R488 Other symbolic dysfunctions: Secondary | ICD-10-CM | POA: Diagnosis not present

## 2023-07-19 ENCOUNTER — Other Ambulatory Visit: Payer: Self-pay

## 2023-07-20 DIAGNOSIS — R4789 Other speech disturbances: Secondary | ICD-10-CM | POA: Diagnosis not present

## 2023-07-20 DIAGNOSIS — R488 Other symbolic dysfunctions: Secondary | ICD-10-CM | POA: Diagnosis not present

## 2023-07-22 DIAGNOSIS — R4789 Other speech disturbances: Secondary | ICD-10-CM | POA: Diagnosis not present

## 2023-07-22 DIAGNOSIS — R488 Other symbolic dysfunctions: Secondary | ICD-10-CM | POA: Diagnosis not present

## 2023-07-25 DIAGNOSIS — R4789 Other speech disturbances: Secondary | ICD-10-CM | POA: Diagnosis not present

## 2023-07-25 DIAGNOSIS — R488 Other symbolic dysfunctions: Secondary | ICD-10-CM | POA: Diagnosis not present

## 2023-07-26 ENCOUNTER — Ambulatory Visit: Admitting: Internal Medicine

## 2023-07-26 ENCOUNTER — Encounter: Payer: Self-pay | Admitting: Internal Medicine

## 2023-07-26 ENCOUNTER — Other Ambulatory Visit (HOSPITAL_COMMUNITY): Payer: Self-pay

## 2023-07-26 ENCOUNTER — Other Ambulatory Visit: Payer: Self-pay

## 2023-07-26 VITALS — BP 126/88 | HR 77 | Temp 98.3°F | Ht 70.0 in | Wt 156.0 lb

## 2023-07-26 DIAGNOSIS — R7303 Prediabetes: Secondary | ICD-10-CM

## 2023-07-26 DIAGNOSIS — E782 Mixed hyperlipidemia: Secondary | ICD-10-CM

## 2023-07-26 DIAGNOSIS — I10 Essential (primary) hypertension: Secondary | ICD-10-CM

## 2023-07-26 DIAGNOSIS — R634 Abnormal weight loss: Secondary | ICD-10-CM

## 2023-07-26 DIAGNOSIS — F419 Anxiety disorder, unspecified: Secondary | ICD-10-CM

## 2023-07-26 DIAGNOSIS — Z8673 Personal history of transient ischemic attack (TIA), and cerebral infarction without residual deficits: Secondary | ICD-10-CM | POA: Diagnosis not present

## 2023-07-26 LAB — COMPREHENSIVE METABOLIC PANEL WITH GFR
ALT: 14 U/L (ref 0–53)
AST: 15 U/L (ref 0–37)
Albumin: 4.1 g/dL (ref 3.5–5.2)
Alkaline Phosphatase: 45 U/L (ref 39–117)
BUN: 18 mg/dL (ref 6–23)
CO2: 36 meq/L — ABNORMAL HIGH (ref 19–32)
Calcium: 9.7 mg/dL (ref 8.4–10.5)
Chloride: 100 meq/L (ref 96–112)
Creatinine, Ser: 1.07 mg/dL (ref 0.40–1.50)
GFR: 60.33 mL/min (ref >60.00)
Glucose, Bld: 100 mg/dL — ABNORMAL HIGH (ref 70–99)
Potassium: 4.2 meq/L (ref 3.5–5.1)
Sodium: 139 meq/L (ref 135–145)
Total Bilirubin: 1 mg/dL (ref 0.2–1.2)
Total Protein: 7.1 g/dL (ref 6.0–8.3)

## 2023-07-26 LAB — LIPID PANEL
Cholesterol: 130 mg/dL (ref 0–200)
HDL: 70.1 mg/dL (ref >39.00)
LDL Cholesterol: 49 mg/dL (ref 0–99)
NonHDL: 60.37
Total CHOL/HDL Ratio: 2
Triglycerides: 57 mg/dL (ref 0.0–149.0)
VLDL: 11.4 mg/dL (ref 0.0–40.0)

## 2023-07-26 LAB — CBC
HCT: 41.3 % (ref 39.0–52.0)
Hemoglobin: 13.6 g/dL (ref 13.0–17.0)
MCHC: 32.8 g/dL (ref 30.0–36.0)
MCV: 94.7 fl (ref 78.0–100.0)
Platelets: 200 K/uL (ref 150.0–400.0)
RBC: 4.36 Mil/uL (ref 4.22–5.81)
RDW: 13.1 % (ref 11.5–15.5)
WBC: 5.4 K/uL (ref 4.0–10.5)

## 2023-07-26 LAB — HEMOGLOBIN A1C: Hgb A1c MFr Bld: 6.4 % (ref 4.6–6.5)

## 2023-07-26 MED ORDER — ARTIFICIAL TEARS 0.2-0.2-1 % OP SOLN
1.0000 [drp] | Freq: Every day | OPHTHALMIC | 11 refills | Status: AC | PRN
  Filled 2023-07-26: qty 15, 30d supply, fill #0

## 2023-07-26 MED ORDER — MIRTAZAPINE 7.5 MG PO TABS
7.5000 mg | ORAL_TABLET | Freq: Every day | ORAL | 1 refills | Status: DC
  Filled 2023-07-26 (×3): qty 90, 90d supply, fill #0
  Filled 2023-10-06 – 2023-10-10 (×2): qty 30, 30d supply, fill #1
  Filled 2023-11-03: qty 30, 30d supply, fill #2
  Filled 2023-11-30: qty 30, 30d supply, fill #3

## 2023-07-26 NOTE — Progress Notes (Unsigned)
   Subjective:   Patient ID: Jeff Lamb, male    DOB: 1929/02/05, 89 y.o.   MRN: [id number]  HPI The patient is a 89 YO man coming in for high BP and low HR. The low HR was in the 50s and was noted by PT. They did recheck it and it was back in the 60s a short while later. He did not have symptoms with it. He is struggling with appetite still and would like to try something to help. Also needs follow up chronic conditions see A/P for details.   Review of Systems  Constitutional:  Positive for activity change and appetite change.  HENT: Negative.    Eyes: Negative.   Respiratory:  Negative for cough, chest tightness and shortness of breath.   Cardiovascular:  Negative for chest pain, palpitations and leg swelling.  Gastrointestinal:  Negative for abdominal distention, abdominal pain, constipation, diarrhea, nausea and vomiting.  Musculoskeletal:  Positive for arthralgias, gait problem and myalgias.  Skin: Negative.   Neurological:  Positive for numbness.  Psychiatric/Behavioral: Negative.      Objective:  Physical Exam Constitutional:      Appearance: He is well-developed.  HENT:     Head: Normocephalic and atraumatic.  Cardiovascular:     Rate and Rhythm: Normal rate and regular rhythm.  Pulmonary:     Effort: Pulmonary effort is normal. No respiratory distress.     Breath sounds: Normal breath sounds. No wheezing or rales.  Abdominal:     General: Bowel sounds are normal. There is no distension.     Palpations: Abdomen is soft.     Tenderness: There is no abdominal tenderness. There is no rebound.  Musculoskeletal:     Cervical back: Normal range of motion.  Skin:    General: Skin is warm and dry.  Neurological:     Mental Status: He is alert and oriented to person, place, and time.     Cranial Nerves: No cranial nerve deficit.     Motor: Weakness present.     Coordination: Coordination abnormal.     Comments: Left sided stable post-stroke appearance with decreased muscle  tone and some rigidity with decreased ROM compared to right side.      Vitals:   07/26/23 1404  BP: 126/88  Pulse: 77  Temp: 98.3 F (36.8 C)  TempSrc: Oral  SpO2: 96%  Weight: 156 lb (70.8 kg)  Height: 5' 10 (1.778 m)    Assessment & Plan:

## 2023-07-26 NOTE — Patient Instructions (Signed)
 We will check the labs today.  We have sent in the eye drops to use.   We have sent in mirtazapine  to take 1 pill at night time to help with the appetite.

## 2023-07-27 ENCOUNTER — Encounter: Payer: Self-pay | Admitting: Internal Medicine

## 2023-07-27 ENCOUNTER — Other Ambulatory Visit (HOSPITAL_COMMUNITY): Payer: Self-pay

## 2023-07-27 DIAGNOSIS — R4789 Other speech disturbances: Secondary | ICD-10-CM | POA: Diagnosis not present

## 2023-07-27 DIAGNOSIS — R488 Other symbolic dysfunctions: Secondary | ICD-10-CM | POA: Diagnosis not present

## 2023-07-27 NOTE — Assessment & Plan Note (Addendum)
 With stable gait deficits on the left side with weakness and he needs to be holding on to things to walk. He uses wheelchair outside of home can walk short distances. Working with PT ongoing to help maintain progress. BP at goal, checking lipid panel and HGA1c for monitoring. Taking lipitor and eliquis .

## 2023-07-27 NOTE — Assessment & Plan Note (Signed)
 Controlled with lexapro  10 mg daily which we will continue.

## 2023-07-27 NOTE — Assessment & Plan Note (Signed)
 Checking Hga1c and adjust as needed.

## 2023-07-27 NOTE — Assessment & Plan Note (Signed)
 Checking lipid panel and adjust lipitor as needed for LDL <70 goal.

## 2023-07-27 NOTE — Assessment & Plan Note (Signed)
 BP at goal on amlodipine  2.5 mg daily and hydrochlorothiazide  25 mg daily and checking CMP and CBC and adjust as needed.

## 2023-07-27 NOTE — Assessment & Plan Note (Signed)
 Weight has stabilized and is increased from last visit. He still feels poor appetite. Rx mirtazapine  7.5 mg at bedtime to help with appetite.

## 2023-07-28 ENCOUNTER — Ambulatory Visit: Payer: Self-pay | Admitting: Internal Medicine

## 2023-07-29 ENCOUNTER — Other Ambulatory Visit: Payer: Self-pay | Admitting: Internal Medicine

## 2023-07-29 ENCOUNTER — Other Ambulatory Visit: Payer: Self-pay

## 2023-07-29 ENCOUNTER — Other Ambulatory Visit (HOSPITAL_COMMUNITY): Payer: Self-pay

## 2023-07-29 DIAGNOSIS — R4789 Other speech disturbances: Secondary | ICD-10-CM | POA: Diagnosis not present

## 2023-07-29 DIAGNOSIS — R488 Other symbolic dysfunctions: Secondary | ICD-10-CM | POA: Diagnosis not present

## 2023-07-29 MED ORDER — AMLODIPINE BESYLATE 2.5 MG PO TABS
2.5000 mg | ORAL_TABLET | Freq: Every morning | ORAL | 0 refills | Status: DC
  Filled 2023-08-09 (×2): qty 30, 30d supply, fill #0
  Filled 2023-09-06: qty 30, 30d supply, fill #1
  Filled 2023-10-06 – 2023-10-10 (×2): qty 30, 30d supply, fill #2

## 2023-07-29 MED ORDER — ATORVASTATIN CALCIUM 10 MG PO TABS
10.0000 mg | ORAL_TABLET | Freq: Every day | ORAL | 0 refills | Status: DC
  Filled 2023-08-09 (×2): qty 30, 30d supply, fill #0
  Filled 2023-09-06: qty 30, 30d supply, fill #1
  Filled 2023-10-06 – 2023-10-10 (×2): qty 30, 30d supply, fill #2

## 2023-07-30 ENCOUNTER — Other Ambulatory Visit (HOSPITAL_COMMUNITY): Payer: Self-pay

## 2023-08-01 DIAGNOSIS — R4789 Other speech disturbances: Secondary | ICD-10-CM | POA: Diagnosis not present

## 2023-08-01 DIAGNOSIS — R488 Other symbolic dysfunctions: Secondary | ICD-10-CM | POA: Diagnosis not present

## 2023-08-02 ENCOUNTER — Other Ambulatory Visit (HOSPITAL_COMMUNITY): Payer: Self-pay

## 2023-08-03 ENCOUNTER — Other Ambulatory Visit (HOSPITAL_COMMUNITY): Payer: Self-pay

## 2023-08-03 DIAGNOSIS — R488 Other symbolic dysfunctions: Secondary | ICD-10-CM | POA: Diagnosis not present

## 2023-08-03 DIAGNOSIS — R4789 Other speech disturbances: Secondary | ICD-10-CM | POA: Diagnosis not present

## 2023-08-04 ENCOUNTER — Other Ambulatory Visit (HOSPITAL_COMMUNITY): Payer: Self-pay

## 2023-08-04 ENCOUNTER — Other Ambulatory Visit: Payer: Self-pay

## 2023-08-05 DIAGNOSIS — R4789 Other speech disturbances: Secondary | ICD-10-CM | POA: Diagnosis not present

## 2023-08-05 DIAGNOSIS — R488 Other symbolic dysfunctions: Secondary | ICD-10-CM | POA: Diagnosis not present

## 2023-08-08 DIAGNOSIS — R488 Other symbolic dysfunctions: Secondary | ICD-10-CM | POA: Diagnosis not present

## 2023-08-08 DIAGNOSIS — R4789 Other speech disturbances: Secondary | ICD-10-CM | POA: Diagnosis not present

## 2023-08-09 ENCOUNTER — Other Ambulatory Visit: Payer: Self-pay

## 2023-08-09 ENCOUNTER — Other Ambulatory Visit (HOSPITAL_COMMUNITY): Payer: Self-pay

## 2023-08-10 ENCOUNTER — Other Ambulatory Visit: Payer: Self-pay

## 2023-08-10 DIAGNOSIS — R4789 Other speech disturbances: Secondary | ICD-10-CM | POA: Diagnosis not present

## 2023-08-10 DIAGNOSIS — R488 Other symbolic dysfunctions: Secondary | ICD-10-CM | POA: Diagnosis not present

## 2023-08-12 DIAGNOSIS — R488 Other symbolic dysfunctions: Secondary | ICD-10-CM | POA: Diagnosis not present

## 2023-08-12 DIAGNOSIS — R4789 Other speech disturbances: Secondary | ICD-10-CM | POA: Diagnosis not present

## 2023-08-15 DIAGNOSIS — R488 Other symbolic dysfunctions: Secondary | ICD-10-CM | POA: Diagnosis not present

## 2023-08-15 DIAGNOSIS — R4789 Other speech disturbances: Secondary | ICD-10-CM | POA: Diagnosis not present

## 2023-08-17 DIAGNOSIS — R4789 Other speech disturbances: Secondary | ICD-10-CM | POA: Diagnosis not present

## 2023-08-17 DIAGNOSIS — R488 Other symbolic dysfunctions: Secondary | ICD-10-CM | POA: Diagnosis not present

## 2023-08-19 DIAGNOSIS — R488 Other symbolic dysfunctions: Secondary | ICD-10-CM | POA: Diagnosis not present

## 2023-08-19 DIAGNOSIS — R4789 Other speech disturbances: Secondary | ICD-10-CM | POA: Diagnosis not present

## 2023-08-22 DIAGNOSIS — R488 Other symbolic dysfunctions: Secondary | ICD-10-CM | POA: Diagnosis not present

## 2023-08-22 DIAGNOSIS — R4789 Other speech disturbances: Secondary | ICD-10-CM | POA: Diagnosis not present

## 2023-08-24 DIAGNOSIS — R4789 Other speech disturbances: Secondary | ICD-10-CM | POA: Diagnosis not present

## 2023-08-24 DIAGNOSIS — R488 Other symbolic dysfunctions: Secondary | ICD-10-CM | POA: Diagnosis not present

## 2023-08-29 ENCOUNTER — Other Ambulatory Visit: Payer: Self-pay | Admitting: Internal Medicine

## 2023-08-29 ENCOUNTER — Other Ambulatory Visit: Payer: Self-pay

## 2023-08-30 DIAGNOSIS — R4789 Other speech disturbances: Secondary | ICD-10-CM | POA: Diagnosis not present

## 2023-08-30 DIAGNOSIS — R488 Other symbolic dysfunctions: Secondary | ICD-10-CM | POA: Diagnosis not present

## 2023-08-31 ENCOUNTER — Other Ambulatory Visit (HOSPITAL_COMMUNITY): Payer: Self-pay

## 2023-08-31 DIAGNOSIS — R488 Other symbolic dysfunctions: Secondary | ICD-10-CM | POA: Diagnosis not present

## 2023-08-31 DIAGNOSIS — R4789 Other speech disturbances: Secondary | ICD-10-CM | POA: Diagnosis not present

## 2023-08-31 MED ORDER — HYDROCHLOROTHIAZIDE 25 MG PO TABS
25.0000 mg | ORAL_TABLET | Freq: Every day | ORAL | 1 refills | Status: DC
  Filled 2023-08-31: qty 90, 90d supply, fill #0
  Filled 2023-09-06: qty 30, 30d supply, fill #0
  Filled 2023-10-06 – 2023-10-10 (×2): qty 30, 30d supply, fill #1
  Filled 2023-11-03: qty 30, 30d supply, fill #2
  Filled 2023-11-30: qty 30, 30d supply, fill #3
  Filled 2024-01-09: qty 30, 30d supply, fill #4
  Filled 2024-02-08: qty 30, 30d supply, fill #5

## 2023-08-31 MED ORDER — APIXABAN 5 MG PO TABS
5.0000 mg | ORAL_TABLET | Freq: Two times a day (BID) | ORAL | 1 refills | Status: DC
  Filled 2023-08-31: qty 180, 90d supply, fill #0
  Filled 2023-09-06: qty 60, 30d supply, fill #0
  Filled 2023-10-06 – 2023-10-10 (×2): qty 60, 30d supply, fill #1
  Filled 2023-11-03: qty 60, 30d supply, fill #2
  Filled 2023-11-30: qty 60, 30d supply, fill #3
  Filled 2024-01-09: qty 60, 30d supply, fill #4
  Filled 2024-02-08: qty 60, 30d supply, fill #5

## 2023-09-01 ENCOUNTER — Other Ambulatory Visit: Payer: Self-pay

## 2023-09-01 DIAGNOSIS — R4789 Other speech disturbances: Secondary | ICD-10-CM | POA: Diagnosis not present

## 2023-09-01 DIAGNOSIS — R488 Other symbolic dysfunctions: Secondary | ICD-10-CM | POA: Diagnosis not present

## 2023-09-02 DIAGNOSIS — R488 Other symbolic dysfunctions: Secondary | ICD-10-CM | POA: Diagnosis not present

## 2023-09-02 DIAGNOSIS — R4789 Other speech disturbances: Secondary | ICD-10-CM | POA: Diagnosis not present

## 2023-09-05 DIAGNOSIS — R4789 Other speech disturbances: Secondary | ICD-10-CM | POA: Diagnosis not present

## 2023-09-05 DIAGNOSIS — R488 Other symbolic dysfunctions: Secondary | ICD-10-CM | POA: Diagnosis not present

## 2023-09-06 ENCOUNTER — Other Ambulatory Visit (HOSPITAL_COMMUNITY): Payer: Self-pay

## 2023-09-06 ENCOUNTER — Telehealth: Payer: Self-pay

## 2023-09-06 ENCOUNTER — Other Ambulatory Visit: Payer: Self-pay

## 2023-09-06 NOTE — Telephone Encounter (Signed)
 Speech Therapy Forms Received 09/05/2023

## 2023-09-07 ENCOUNTER — Other Ambulatory Visit: Payer: Self-pay

## 2023-09-07 DIAGNOSIS — R488 Other symbolic dysfunctions: Secondary | ICD-10-CM | POA: Diagnosis not present

## 2023-09-07 DIAGNOSIS — R4789 Other speech disturbances: Secondary | ICD-10-CM | POA: Diagnosis not present

## 2023-09-09 DIAGNOSIS — R4789 Other speech disturbances: Secondary | ICD-10-CM | POA: Diagnosis not present

## 2023-09-09 DIAGNOSIS — R488 Other symbolic dysfunctions: Secondary | ICD-10-CM | POA: Diagnosis not present

## 2023-09-13 ENCOUNTER — Telehealth: Payer: Self-pay

## 2023-09-13 DIAGNOSIS — R4789 Other speech disturbances: Secondary | ICD-10-CM | POA: Diagnosis not present

## 2023-09-13 DIAGNOSIS — R488 Other symbolic dysfunctions: Secondary | ICD-10-CM | POA: Diagnosis not present

## 2023-09-13 NOTE — Telephone Encounter (Signed)
 Speech Therapy from [employer] was received on 09/05/2023 and fax back successfully

## 2023-09-14 DIAGNOSIS — R488 Other symbolic dysfunctions: Secondary | ICD-10-CM | POA: Diagnosis not present

## 2023-09-14 DIAGNOSIS — R4789 Other speech disturbances: Secondary | ICD-10-CM | POA: Diagnosis not present

## 2023-09-16 DIAGNOSIS — R4789 Other speech disturbances: Secondary | ICD-10-CM | POA: Diagnosis not present

## 2023-09-16 DIAGNOSIS — R488 Other symbolic dysfunctions: Secondary | ICD-10-CM | POA: Diagnosis not present

## 2023-09-18 DIAGNOSIS — R4789 Other speech disturbances: Secondary | ICD-10-CM | POA: Diagnosis not present

## 2023-09-18 DIAGNOSIS — R488 Other symbolic dysfunctions: Secondary | ICD-10-CM | POA: Diagnosis not present

## 2023-09-20 DIAGNOSIS — R4789 Other speech disturbances: Secondary | ICD-10-CM | POA: Diagnosis not present

## 2023-09-20 DIAGNOSIS — R488 Other symbolic dysfunctions: Secondary | ICD-10-CM | POA: Diagnosis not present

## 2023-09-21 DIAGNOSIS — R278 Other lack of coordination: Secondary | ICD-10-CM | POA: Diagnosis not present

## 2023-09-21 DIAGNOSIS — R2681 Unsteadiness on feet: Secondary | ICD-10-CM | POA: Diagnosis not present

## 2023-09-21 DIAGNOSIS — R4789 Other speech disturbances: Secondary | ICD-10-CM | POA: Diagnosis not present

## 2023-09-21 DIAGNOSIS — R488 Other symbolic dysfunctions: Secondary | ICD-10-CM | POA: Diagnosis not present

## 2023-09-26 ENCOUNTER — Other Ambulatory Visit: Payer: Self-pay | Admitting: Internal Medicine

## 2023-09-26 ENCOUNTER — Other Ambulatory Visit: Payer: Self-pay

## 2023-09-26 DIAGNOSIS — R488 Other symbolic dysfunctions: Secondary | ICD-10-CM | POA: Diagnosis not present

## 2023-09-26 DIAGNOSIS — R4789 Other speech disturbances: Secondary | ICD-10-CM | POA: Diagnosis not present

## 2023-09-26 MED ORDER — GABAPENTIN 100 MG PO CAPS
100.0000 mg | ORAL_CAPSULE | Freq: Every evening | ORAL | 1 refills | Status: DC
  Filled 2023-10-06 – 2023-10-10 (×2): qty 30, 30d supply, fill #0
  Filled 2023-11-03: qty 30, 30d supply, fill #1
  Filled 2023-11-30: qty 30, 30d supply, fill #2
  Filled 2024-01-09: qty 30, 30d supply, fill #3
  Filled 2024-02-08: qty 30, 30d supply, fill #4

## 2023-09-27 ENCOUNTER — Other Ambulatory Visit: Payer: Self-pay

## 2023-09-27 DIAGNOSIS — R4789 Other speech disturbances: Secondary | ICD-10-CM | POA: Diagnosis not present

## 2023-09-27 DIAGNOSIS — R488 Other symbolic dysfunctions: Secondary | ICD-10-CM | POA: Diagnosis not present

## 2023-09-28 ENCOUNTER — Other Ambulatory Visit (HOSPITAL_COMMUNITY): Payer: Self-pay

## 2023-09-28 DIAGNOSIS — R2681 Unsteadiness on feet: Secondary | ICD-10-CM | POA: Diagnosis not present

## 2023-09-28 DIAGNOSIS — R278 Other lack of coordination: Secondary | ICD-10-CM | POA: Diagnosis not present

## 2023-09-29 DIAGNOSIS — R278 Other lack of coordination: Secondary | ICD-10-CM | POA: Diagnosis not present

## 2023-09-29 DIAGNOSIS — R4789 Other speech disturbances: Secondary | ICD-10-CM | POA: Diagnosis not present

## 2023-09-29 DIAGNOSIS — R2681 Unsteadiness on feet: Secondary | ICD-10-CM | POA: Diagnosis not present

## 2023-09-29 DIAGNOSIS — R488 Other symbolic dysfunctions: Secondary | ICD-10-CM | POA: Diagnosis not present

## 2023-09-30 DIAGNOSIS — R278 Other lack of coordination: Secondary | ICD-10-CM | POA: Diagnosis not present

## 2023-09-30 DIAGNOSIS — R2681 Unsteadiness on feet: Secondary | ICD-10-CM | POA: Diagnosis not present

## 2023-10-03 DIAGNOSIS — R278 Other lack of coordination: Secondary | ICD-10-CM | POA: Diagnosis not present

## 2023-10-03 DIAGNOSIS — R2681 Unsteadiness on feet: Secondary | ICD-10-CM | POA: Diagnosis not present

## 2023-10-04 DIAGNOSIS — R2681 Unsteadiness on feet: Secondary | ICD-10-CM | POA: Diagnosis not present

## 2023-10-04 DIAGNOSIS — R278 Other lack of coordination: Secondary | ICD-10-CM | POA: Diagnosis not present

## 2023-10-05 DIAGNOSIS — R278 Other lack of coordination: Secondary | ICD-10-CM | POA: Diagnosis not present

## 2023-10-05 DIAGNOSIS — R2681 Unsteadiness on feet: Secondary | ICD-10-CM | POA: Diagnosis not present

## 2023-10-06 ENCOUNTER — Other Ambulatory Visit (HOSPITAL_COMMUNITY): Payer: Self-pay

## 2023-10-06 ENCOUNTER — Other Ambulatory Visit: Payer: Self-pay

## 2023-10-06 DIAGNOSIS — R2681 Unsteadiness on feet: Secondary | ICD-10-CM | POA: Diagnosis not present

## 2023-10-06 DIAGNOSIS — R278 Other lack of coordination: Secondary | ICD-10-CM | POA: Diagnosis not present

## 2023-10-07 ENCOUNTER — Other Ambulatory Visit: Payer: Self-pay

## 2023-10-07 DIAGNOSIS — I700 Atherosclerosis of aorta: Secondary | ICD-10-CM | POA: Diagnosis not present

## 2023-10-07 DIAGNOSIS — F325 Major depressive disorder, single episode, in full remission: Secondary | ICD-10-CM | POA: Diagnosis not present

## 2023-10-07 DIAGNOSIS — F419 Anxiety disorder, unspecified: Secondary | ICD-10-CM | POA: Diagnosis not present

## 2023-10-07 DIAGNOSIS — E785 Hyperlipidemia, unspecified: Secondary | ICD-10-CM | POA: Diagnosis not present

## 2023-10-07 DIAGNOSIS — Z7901 Long term (current) use of anticoagulants: Secondary | ICD-10-CM | POA: Diagnosis not present

## 2023-10-07 DIAGNOSIS — D6869 Other thrombophilia: Secondary | ICD-10-CM | POA: Diagnosis not present

## 2023-10-07 DIAGNOSIS — Z823 Family history of stroke: Secondary | ICD-10-CM | POA: Diagnosis not present

## 2023-10-07 DIAGNOSIS — Z9989 Dependence on other enabling machines and devices: Secondary | ICD-10-CM | POA: Diagnosis not present

## 2023-10-07 DIAGNOSIS — N529 Male erectile dysfunction, unspecified: Secondary | ICD-10-CM | POA: Diagnosis not present

## 2023-10-07 DIAGNOSIS — I69354 Hemiplegia and hemiparesis following cerebral infarction affecting left non-dominant side: Secondary | ICD-10-CM | POA: Diagnosis not present

## 2023-10-07 DIAGNOSIS — R7303 Prediabetes: Secondary | ICD-10-CM | POA: Diagnosis not present

## 2023-10-07 DIAGNOSIS — Z9181 History of falling: Secondary | ICD-10-CM | POA: Diagnosis not present

## 2023-10-07 DIAGNOSIS — E876 Hypokalemia: Secondary | ICD-10-CM | POA: Diagnosis not present

## 2023-10-07 DIAGNOSIS — M1990 Unspecified osteoarthritis, unspecified site: Secondary | ICD-10-CM | POA: Diagnosis not present

## 2023-10-07 DIAGNOSIS — H9193 Unspecified hearing loss, bilateral: Secondary | ICD-10-CM | POA: Diagnosis not present

## 2023-10-07 DIAGNOSIS — I4891 Unspecified atrial fibrillation: Secondary | ICD-10-CM | POA: Diagnosis not present

## 2023-10-07 DIAGNOSIS — N182 Chronic kidney disease, stage 2 (mild): Secondary | ICD-10-CM | POA: Diagnosis not present

## 2023-10-10 ENCOUNTER — Other Ambulatory Visit: Payer: Self-pay

## 2023-10-10 ENCOUNTER — Other Ambulatory Visit (HOSPITAL_COMMUNITY): Payer: Self-pay

## 2023-10-10 DIAGNOSIS — R2681 Unsteadiness on feet: Secondary | ICD-10-CM | POA: Diagnosis not present

## 2023-10-10 DIAGNOSIS — R4789 Other speech disturbances: Secondary | ICD-10-CM | POA: Diagnosis not present

## 2023-10-10 DIAGNOSIS — R488 Other symbolic dysfunctions: Secondary | ICD-10-CM | POA: Diagnosis not present

## 2023-10-10 DIAGNOSIS — R278 Other lack of coordination: Secondary | ICD-10-CM | POA: Diagnosis not present

## 2023-10-11 DIAGNOSIS — R278 Other lack of coordination: Secondary | ICD-10-CM | POA: Diagnosis not present

## 2023-10-11 DIAGNOSIS — R2681 Unsteadiness on feet: Secondary | ICD-10-CM | POA: Diagnosis not present

## 2023-10-12 ENCOUNTER — Telehealth: Payer: Self-pay

## 2023-10-12 DIAGNOSIS — R488 Other symbolic dysfunctions: Secondary | ICD-10-CM | POA: Diagnosis not present

## 2023-10-12 DIAGNOSIS — Z8673 Personal history of transient ischemic attack (TIA), and cerebral infarction without residual deficits: Secondary | ICD-10-CM

## 2023-10-12 DIAGNOSIS — R4789 Other speech disturbances: Secondary | ICD-10-CM | POA: Diagnosis not present

## 2023-10-12 DIAGNOSIS — R278 Other lack of coordination: Secondary | ICD-10-CM | POA: Diagnosis not present

## 2023-10-12 DIAGNOSIS — R2681 Unsteadiness on feet: Secondary | ICD-10-CM | POA: Diagnosis not present
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
## 2023-10-13 DIAGNOSIS — R488 Other symbolic dysfunctions: Secondary | ICD-10-CM | POA: Diagnosis not present

## 2023-10-13 DIAGNOSIS — R4789 Other speech disturbances: Secondary | ICD-10-CM | POA: Diagnosis not present

## 2023-10-13 DIAGNOSIS — R278 Other lack of coordination: Secondary | ICD-10-CM | POA: Diagnosis not present

## 2023-10-13 DIAGNOSIS — R2681 Unsteadiness on feet: Secondary | ICD-10-CM | POA: Diagnosis not present

## 2023-10-13 NOTE — Telephone Encounter (Signed)
 Is he requesting manual or electric wheelchair? We will need specific order to do.

## 2023-10-13 NOTE — Telephone Encounter (Signed)
 Called the callback number and no answer I have left a VS

## 2023-10-14 DIAGNOSIS — R2681 Unsteadiness on feet: Secondary | ICD-10-CM | POA: Diagnosis not present

## 2023-10-14 NOTE — Telephone Encounter (Signed)
 Spoke with Jeff Lamb and they are asking for a manual wheelchair. No tire on the right side

## 2023-10-17 DIAGNOSIS — R278 Other lack of coordination: Secondary | ICD-10-CM | POA: Diagnosis not present

## 2023-10-17 DIAGNOSIS — R488 Other symbolic dysfunctions: Secondary | ICD-10-CM | POA: Diagnosis not present

## 2023-10-17 DIAGNOSIS — R4789 Other speech disturbances: Secondary | ICD-10-CM | POA: Diagnosis not present

## 2023-10-17 DIAGNOSIS — R2681 Unsteadiness on feet: Secondary | ICD-10-CM | POA: Diagnosis not present

## 2023-10-19 DIAGNOSIS — R4789 Other speech disturbances: Secondary | ICD-10-CM | POA: Diagnosis not present

## 2023-10-19 NOTE — Telephone Encounter (Signed)
 I have sent a message to [employer] informing them of this

## 2023-10-19 NOTE — Telephone Encounter (Signed)
 Order placed okay to let [employer] know

## 2023-10-20 DIAGNOSIS — R2681 Unsteadiness on feet: Secondary | ICD-10-CM | POA: Diagnosis not present

## 2023-10-20 DIAGNOSIS — R278 Other lack of coordination: Secondary | ICD-10-CM | POA: Diagnosis not present

## 2023-10-21 ENCOUNTER — Telehealth: Payer: Self-pay

## 2023-10-21 DIAGNOSIS — R2681 Unsteadiness on feet: Secondary | ICD-10-CM | POA: Diagnosis not present

## 2023-10-21 DIAGNOSIS — R278 Other lack of coordination: Secondary | ICD-10-CM | POA: Diagnosis not present

## 2023-10-21 NOTE — Telephone Encounter (Signed)
 Copied from CRM [id number]. Topic: General - Other >> Oct 21, 2023  1:17 PM Viola F wrote: Reason for CRM: Recardo from [employer] called to confirm if Delayed Certification fom was received via fax today? Says she really needs it back to resume patient care. Please call back [phone] to let Recardo know if fax was received.

## 2023-10-23 DIAGNOSIS — R4789 Other speech disturbances: Secondary | ICD-10-CM | POA: Diagnosis not present

## 2023-10-23 DIAGNOSIS — R488 Other symbolic dysfunctions: Secondary | ICD-10-CM | POA: Diagnosis not present

## 2023-10-24 DIAGNOSIS — R2681 Unsteadiness on feet: Secondary | ICD-10-CM | POA: Diagnosis not present

## 2023-10-24 DIAGNOSIS — R278 Other lack of coordination: Secondary | ICD-10-CM | POA: Diagnosis not present

## 2023-10-24 DIAGNOSIS — R4789 Other speech disturbances: Secondary | ICD-10-CM | POA: Diagnosis not present

## 2023-10-24 DIAGNOSIS — R488 Other symbolic dysfunctions: Secondary | ICD-10-CM | POA: Diagnosis not present

## 2023-10-25 DIAGNOSIS — R278 Other lack of coordination: Secondary | ICD-10-CM | POA: Diagnosis not present

## 2023-10-25 DIAGNOSIS — R2681 Unsteadiness on feet: Secondary | ICD-10-CM | POA: Diagnosis not present

## 2023-10-25 NOTE — Telephone Encounter (Signed)
 I not sure if what delayed certification mean but the latest certification I have received and sent back was on the October 3rd

## 2023-10-25 NOTE — Telephone Encounter (Signed)
 This form was sent back on 10/24/2023

## 2023-10-26 DIAGNOSIS — R278 Other lack of coordination: Secondary | ICD-10-CM | POA: Diagnosis not present

## 2023-10-26 DIAGNOSIS — R2681 Unsteadiness on feet: Secondary | ICD-10-CM | POA: Diagnosis not present

## 2023-10-27 DIAGNOSIS — R278 Other lack of coordination: Secondary | ICD-10-CM | POA: Diagnosis not present

## 2023-10-31 ENCOUNTER — Other Ambulatory Visit: Payer: Self-pay

## 2023-10-31 ENCOUNTER — Other Ambulatory Visit: Payer: Self-pay | Admitting: Internal Medicine

## 2023-10-31 ENCOUNTER — Other Ambulatory Visit (HOSPITAL_COMMUNITY): Payer: Self-pay

## 2023-10-31 DIAGNOSIS — R4789 Other speech disturbances: Secondary | ICD-10-CM | POA: Diagnosis not present

## 2023-10-31 DIAGNOSIS — R488 Other symbolic dysfunctions: Secondary | ICD-10-CM | POA: Diagnosis not present

## 2023-10-31 MED ORDER — ATORVASTATIN CALCIUM 10 MG PO TABS
10.0000 mg | ORAL_TABLET | Freq: Every day | ORAL | 0 refills | Status: DC
  Filled 2023-10-31 – 2023-11-03 (×2): qty 30, 30d supply, fill #0
  Filled 2023-11-30: qty 30, 30d supply, fill #1
  Filled 2024-01-09: qty 30, 30d supply, fill #2

## 2023-10-31 MED ORDER — AMLODIPINE BESYLATE 2.5 MG PO TABS
2.5000 mg | ORAL_TABLET | Freq: Every morning | ORAL | 0 refills | Status: DC
  Filled 2023-10-31 – 2023-11-03 (×2): qty 30, 30d supply, fill #0
  Filled 2023-11-30: qty 30, 30d supply, fill #1
  Filled 2024-01-09: qty 30, 30d supply, fill #2

## 2023-11-01 ENCOUNTER — Other Ambulatory Visit: Payer: Self-pay

## 2023-11-01 ENCOUNTER — Ambulatory Visit: Payer: Self-pay

## 2023-11-01 ENCOUNTER — Telehealth: Payer: Self-pay
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
## 2023-11-01 NOTE — Telephone Encounter (Signed)
 Copied from CRM [id number]. Topic: General - Other >> Nov 01, 2023  4:53 PM Alfonso ORN wrote: Reason for CRM: Recardo from [employer] called back to confirm if Delayed Certification fom was received , she is needing pcp signature for the back page (paper eval page for PT and speech) . Contacted clinic who advised send crm to nurse pool and would be sent to [facility] . Please contact Mliss at [phone]

## 2023-11-02 ENCOUNTER — Other Ambulatory Visit (HOSPITAL_COMMUNITY): Payer: Self-pay

## 2023-11-02 ENCOUNTER — Ambulatory Visit: Payer: Self-pay | Admitting: Emergency Medicine

## 2023-11-02 ENCOUNTER — Other Ambulatory Visit: Payer: Self-pay

## 2023-11-02 ENCOUNTER — Encounter: Payer: Self-pay | Admitting: Emergency Medicine

## 2023-11-02 ENCOUNTER — Ambulatory Visit (INDEPENDENT_AMBULATORY_CARE_PROVIDER_SITE_OTHER): Admitting: Emergency Medicine

## 2023-11-02 VITALS — BP 110/74 | HR 63 | Temp 98.7°F | Ht 70.0 in | Wt 164.8 lb

## 2023-11-02 DIAGNOSIS — Z23 Encounter for immunization: Secondary | ICD-10-CM | POA: Diagnosis not present

## 2023-11-02 DIAGNOSIS — I10 Essential (primary) hypertension: Secondary | ICD-10-CM

## 2023-11-02 DIAGNOSIS — R531 Weakness: Secondary | ICD-10-CM | POA: Diagnosis not present

## 2023-11-02 LAB — CBC WITH DIFFERENTIAL/PLATELET
Basophils Absolute: 0 K/uL (ref 0.0–0.1)
Basophils Relative: 0.6 % (ref 0.0–3.0)
Eosinophils Absolute: 0.2 K/uL (ref 0.0–0.7)
Eosinophils Relative: 3.7 % (ref 0.0–5.0)
HCT: 40.3 % (ref 39.0–52.0)
Hemoglobin: 13.1 g/dL (ref 13.0–17.0)
Lymphocytes Relative: 32.3 % (ref 12.0–46.0)
Lymphs Abs: 2.1 K/uL (ref 0.7–4.0)
MCHC: 32.5 g/dL (ref 30.0–36.0)
MCV: 95.5 fl (ref 78.0–100.0)
Monocytes Absolute: 0.7 K/uL (ref 0.1–1.0)
Monocytes Relative: 11.1 % (ref 3.0–12.0)
Neutro Abs: 3.4 K/uL (ref 1.4–7.7)
Neutrophils Relative %: 52.3 % (ref 43.0–77.0)
Platelets: 195 K/uL (ref 150.0–400.0)
RBC: 4.22 Mil/uL (ref 4.22–5.81)
RDW: 13 % (ref 11.5–15.5)
WBC: 6.5 K/uL (ref 4.0–10.5)

## 2023-11-02 LAB — COMPREHENSIVE METABOLIC PANEL WITH GFR
ALT: 13 U/L (ref 0–53)
AST: 18 U/L (ref 0–37)
Albumin: 4 g/dL (ref 3.5–5.2)
Alkaline Phosphatase: 50 U/L (ref 39–117)
BUN: 17 mg/dL (ref 6–23)
CO2: 35 meq/L — ABNORMAL HIGH (ref 19–32)
Calcium: 9.2 mg/dL (ref 8.4–10.5)
Chloride: 99 meq/L (ref 96–112)
Creatinine, Ser: 1.18 mg/dL (ref 0.40–1.50)
GFR: 53.54 mL/min — ABNORMAL LOW (ref >60.00)
Glucose, Bld: 92 mg/dL (ref 70–99)
Potassium: 4.1 meq/L (ref 3.5–5.1)
Sodium: 139 meq/L (ref 135–145)
Total Bilirubin: 0.8 mg/dL (ref 0.2–1.2)
Total Protein: 7.2 g/dL (ref 6.0–8.3)

## 2023-11-02 NOTE — Telephone Encounter (Signed)
 Forms were faxed back successfully already

## 2023-11-02 NOTE — Progress Notes (Signed)
 Jeff Lamb 89 y.o.   Chief Complaint  Patient presents with   Fall   Dehydration    Jeff Lamb about a week ago slid out of the bed. Has happened one other time. Isnt drinking enough water.     HISTORY OF PRESENT ILLNESS: Acute problem visit today Accompanied by caregiver This is a 89 y.o. male accidentally fell last week.  No injuries. Not drinking enough lately.  Worries about dehydration However patient feels fine and has no complaints.  Fall Pertinent negatives include no abdominal pain, fever, headaches, hematuria, nausea or vomiting.     Prior to Admission medications   Medication Sig Start Date End Date Taking? Authorizing Provider  acetaminophen  (TYLENOL ) 325 MG tablet Take 325-650 mg by mouth every 6 (six) hours as needed.   Yes [provider]  amLODipine  (NORVASC ) 2.5 MG tablet Take 1 tablet (2.5 mg total) by mouth every morning. 10/31/23  Yes Rollene Almarie LABOR, MD  apixaban  (ELIQUIS ) 5 MG TABS tablet Take 1 tablet (5 mg total) by mouth in the morning and at bedtime. 08/31/23  Yes Rollene Almarie LABOR, MD  atorvastatin  (LIPITOR) 10 MG tablet Take 1 tablet (10 mg total) by mouth at bedtime. 10/31/23  Yes Rollene Almarie LABOR, MD  escitalopram  (LEXAPRO ) 10 MG tablet Take 1 tablet (10 mg total) by mouth every morning. 06/22/23  Yes Rollene Almarie LABOR, MD  gabapentin  (NEURONTIN ) 100 MG capsule Take 1 capsule (100 mg total) by mouth at bedtime. 09/26/23  Yes Rollene Almarie LABOR, MD  Glycerin -Hypromellose-PEG 400 (ARTIFICIAL TEARS) 0.2-0.2-1 % SOLN Apply 1 drop to eye daily as needed (dry eyes). 07/26/23  Yes Rollene Almarie LABOR, MD  hydrochlorothiazide  (HYDRODIURIL ) 25 MG tablet Take 1 tablet (25 mg total) by mouth daily. 08/31/23  Yes Rollene Almarie LABOR, MD  mirtazapine  (REMERON ) 7.5 MG tablet Take 1 tablet (7.5 mg total) by mouth at bedtime. 07/26/23  Yes Rollene Almarie LABOR, MD  Multiple Vitamin (MULTIVITAMIN WITH MINERALS) TABS tablet Take 1 tablet by  mouth every morning.   Yes [provider]  Naphazoline HCl (CLEAR EYES OP) Place 1 drop into both eyes daily as needed (itching/dry eyes).   Yes [provider]  potassium chloride  (KLOR-CON  M) 10 MEQ tablet Take 1 tablet (10 mEq total) by mouth daily. 03/08/23  Yes Rollene Almarie LABOR, MD  senna-docusate (SENOKOT-S) 8.6-50 MG tablet Take 1 tablet by mouth at bedtime. 05/24/19  Yes Rollene Almarie LABOR, MD    No Known Allergies  Patient Active Problem List   Diagnosis Date Noted   Weight loss, unintentional 02/17/2023   Anxiety 08/06/2022   Sensorineural hearing loss (SNHL) of left ear 04/28/2021   Gait disorder 04/23/2021   Left knee pain 09/27/2019   Left arm pain 05/24/2019   Hx pulmonary embolism 11/26/2018   Prediabetes 11/26/2018   Ventral hernia without obstruction or gangrene 03/14/2017   History of stroke 07/12/2013   Hyperlipidemia 07/12/2013   Erectile dysfunction 08/02/2011   Routine health maintenance 12/24/2010   Hypertension     Past Medical History:  Diagnosis Date   Biceps tendonitis 12/24/2010   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 Married ''63 - 1 year /divorced. 1 son '62. 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 Lives alone. Has cousins in the area. Son is principle contact: Jeff DONEEN Domino (c) [phone].               Social Drivers of Corporate investment banker Strain: Low Risk  (06/24/2023)   Overall Financial Resource Strain (CARDIA)    Difficulty of Paying Living Expenses: Not hard at all  Food Insecurity: No Food Insecurity (06/24/2023)   Hunger Vital Sign    Worried About Running Out of Food in the Last Year: Never true    Ran Out of Food in the Last Year: Never true  Transportation Needs: No Transportation Needs (06/24/2023)   PRAPARE - Administrator, Civil Service (Medical): No    Lack of Transportation (Non-Medical): No  Physical Activity: Insufficiently Active (06/24/2023)   Exercise Vital Sign    Days of Exercise per Week: 1 day    Minutes of Exercise per Session: 20 min  Stress: No Stress Concern Present (06/24/2023)   [employer] of Occupational Health - Occupational Stress Questionnaire    Feeling of Stress: Not at all  Social Connections: Moderately Integrated (06/24/2023)   Social Connection and Isolation Panel    Frequency of Communication with Friends and Family: More than three times a week    Frequency of Social Gatherings with Friends and Family: Twice a week    Attends Religious Services: 1 to 4 times per year    Active Member of [employer] or Organizations: Yes    Attends Banker Meetings: 1 to 4 times per year    Marital Status: Divorced  Catering manager Violence: Not At Risk (06/24/2023)   Humiliation, Afraid, Rape, and Kick questionnaire    Fear of Current or Ex-Partner: No    Emotionally Abused: No     Physically Abused: No    Sexually Abused: No    Family History  Problem Relation Age of Onset   Stroke Mother    Hypertension Mother    Cancer Maternal Uncle        throat cancer   Cancer Maternal Uncle        throat cancer   Diabetes Neg Hx    Heart disease Neg Hx      Review of Systems  Constitutional: Negative.  Negative for chills and fever.  HENT: Negative.  Negative for congestion and sore throat.   Respiratory: Negative.  Negative for cough and shortness of breath.   Cardiovascular: Negative.  Negative  for chest pain and palpitations.  Gastrointestinal:  Negative for abdominal pain, diarrhea, nausea and vomiting.  Genitourinary: Negative.  Negative for dysuria and hematuria.  Skin: Negative.  Negative for rash.  Neurological:  Negative for dizziness and headaches.  All other systems reviewed and are negative.   Vitals:   11/02/23 1357  BP: 110/74  Pulse: 63  Temp: 98.7 F (37.1 C)  SpO2: 97%    Physical Exam Vitals reviewed.  Constitutional:      Appearance: Normal appearance.  HENT:     Head: Normocephalic.     Mouth/Throat:     Mouth: Mucous membranes are moist.     Pharynx: Oropharynx is clear.  Eyes:     Extraocular Movements: Extraocular movements intact.  Cardiovascular:     Rate and Rhythm: Normal rate and regular rhythm.     Pulses: Normal pulses.     Heart sounds: Normal heart sounds.  Pulmonary:     Effort: Pulmonary effort is normal.     Breath sounds: Normal breath sounds.  Abdominal:     Palpations: Abdomen is soft.     Tenderness: There is no abdominal tenderness.  Musculoskeletal:     Cervical back: No tenderness.  Lymphadenopathy:     Cervical: No cervical adenopathy.  Skin:    General: Skin is warm and dry.  Neurological:     Mental Status: He is alert and oriented to person, place, and time. Mental status is at baseline.  Psychiatric:        Mood and Affect: Mood normal.        Behavior: Behavior normal.       ASSESSMENT & PLAN: Problem List Items Addressed This Visit       Cardiovascular and Mediastinum   Hypertension   BP Readings from Last 3 Encounters:  11/02/23 110/74  07/26/23 126/88  02/17/23 138/68  Well-controlled hypertension Continue amlodipine  2.5 mg daily and hydrochlorothiazide  25 mg daily         Other   General weakness - Primary   Clinically stable.  No red flag signs or symptoms. Patient seems to be at his baseline regarding general health status. Multiple chronic medical conditions and medications contributing to this Not clinically dehydrated Recommend blood work today Needs to follow-up with his PCP as needed. Continue regular activities as tolerated ED precautions given      Relevant Orders   Comprehensive metabolic panel with GFR   CBC with Differential/Platelet   Other Visit Diagnoses       Immunization due       Relevant Orders   Flu vaccine HIGH DOSE PF(Fluzone Trivalent) (Completed)      Patient Instructions  Health Maintenance After Age 79 After age 18, you are at a higher risk for certain long-term diseases and infections as well as injuries from falls. Falls are a major cause of broken bones and head injuries in people who are older than age 17. Getting regular preventive care can help to keep you healthy and well. Preventive care includes getting regular testing and making lifestyle changes as recommended by your health care provider. Talk with your health care provider about: Which screenings and tests you should have. A screening is a test that checks for a disease when you have no symptoms. A diet and exercise plan that is right for you. What should I know about screenings and tests to prevent falls? Screening and testing are the best ways to find a health problem early. Early diagnosis and treatment  give you the best chance of managing medical conditions that are common after age 60. Certain conditions and lifestyle choices may make  you more likely to have a fall. Your health care provider may recommend: Regular vision checks. Poor vision and conditions such as cataracts can make you more likely to have a fall. If you wear glasses, make sure to get your prescription updated if your vision changes. Medicine review. Work with your health care provider to regularly review all of the medicines you are taking, including over-the-counter medicines. Ask your health care provider about any side effects that may make you more likely to have a fall. Tell your health care provider if any medicines that you take make you feel dizzy or sleepy. Strength and balance checks. Your health care provider may recommend certain tests to check your strength and balance while standing, walking, or changing positions. Foot health exam. Foot pain and numbness, as well as not wearing proper footwear, can make you more likely to have a fall. Screenings, including: Osteoporosis screening. Osteoporosis is a condition that causes the bones to get weaker and break more easily. Blood pressure screening. Blood pressure changes and medicines to control blood pressure can make you feel dizzy. Depression screening. You may be more likely to have a fall if you have a fear of falling, feel depressed, or feel unable to do activities that you used to do. Alcohol  use screening. Using too much alcohol  can affect your balance and may make you more likely to have a fall. Follow these instructions at home: Lifestyle Do not drink alcohol  if: Your health care provider tells you not to drink. If you drink alcohol : Limit how much you have to: 0-1 drink a day for women. 0-2 drinks a day for men. Know how much alcohol  is in your drink. In the [place] one drink equals one 12 oz bottle of beer (355 mL), one 5 oz glass of wine (148 mL), or one 1 oz glass of hard liquor (44 mL). Do not use any products that contain nicotine or tobacco. These products include cigarettes, chewing  tobacco, and vaping devices, such as e-cigarettes. If you need help quitting, ask your health care provider. Activity  Follow a regular exercise program to stay fit. This will help you maintain your balance. Ask your health care provider what types of exercise are appropriate for you. If you need a cane or walker, use it as recommended by your health care provider. Wear supportive shoes that have nonskid soles. Safety  Remove any tripping hazards, such as rugs, cords, and clutter. Install safety equipment such as grab bars in bathrooms and safety rails on stairs. Keep rooms and walkways well-lit. General instructions Talk with your health care provider about your risks for falling. Tell your health care provider if: You fall. Be sure to tell your health care provider about all falls, even ones that seem minor. You feel dizzy, tiredness (fatigue), or off-balance. Take over-the-counter and prescription medicines only as told by your health care provider. These include supplements. Eat a healthy diet and maintain a healthy weight. A healthy diet includes low-fat dairy products, low-fat (lean) meats, and fiber from whole grains, beans, and lots of fruits and vegetables. Stay current with your vaccines. Schedule regular health, dental, and eye exams. Summary Having a healthy lifestyle and getting preventive care can help to protect your health and wellness after age 60. Screening and testing are the best way to find a health problem early and help you  avoid having a fall. Early diagnosis and treatment give you the best chance for managing medical conditions that are more common for people who are older than age 57. Falls are a major cause of broken bones and head injuries in people who are older than age 56. Take precautions to prevent a fall at home. Work with your health care provider to learn what changes you can make to improve your health and wellness and to prevent falls. This information is  not intended to replace advice given to you by your health care provider. Make sure you discuss any questions you have with your health care provider. Document Revised: 05/19/2020 Document Reviewed: 05/19/2020 Elsevier Patient Education  2024 Elsevier Inc.    Emil Schaumann, MD [facility]

## 2023-11-02 NOTE — Assessment & Plan Note (Addendum)
 Clinically stable.  No red flag signs or symptoms. Patient seems to be at his baseline regarding general health status. Multiple chronic medical conditions and medications contributing to this Not clinically dehydrated Recommend blood work today Needs to follow-up with his PCP as needed. Continue regular activities as tolerated ED precautions given

## 2023-11-02 NOTE — Assessment & Plan Note (Signed)
 BP Readings from Last 3 Encounters:  11/02/23 110/74  07/26/23 126/88  02/17/23 138/68  Well-controlled hypertension Continue amlodipine  2.5 mg daily and hydrochlorothiazide  25 mg daily

## 2023-11-02 NOTE — Patient Instructions (Signed)
 Health Maintenance After Age 88 After age 27, you are at a higher risk for certain long-term diseases and infections as well as injuries from falls. Falls are a major cause of broken bones and head injuries in people who are older than age 73. Getting regular preventive care can help to keep you healthy and well. Preventive care includes getting regular testing and making lifestyle changes as recommended by your health care provider. Talk with your health care provider about: Which screenings and tests you should have. A screening is a test that checks for a disease when you have no symptoms. A diet and exercise plan that is right for you. What should I know about screenings and tests to prevent falls? Screening and testing are the best ways to find a health problem early. Early diagnosis and treatment give you the best chance of managing medical conditions that are common after age 90. Certain conditions and lifestyle choices may make you more likely to have a fall. Your health care provider may recommend: Regular vision checks. Poor vision and conditions such as cataracts can make you more likely to have a fall. If you wear glasses, make sure to get your prescription updated if your vision changes. Medicine review. Work with your health care provider to regularly review all of the medicines you are taking, including over-the-counter medicines. Ask your health care provider about any side effects that may make you more likely to have a fall. Tell your health care provider if any medicines that you take make you feel dizzy or sleepy. Strength and balance checks. Your health care provider may recommend certain tests to check your strength and balance while standing, walking, or changing positions. Foot health exam. Foot pain and numbness, as well as not wearing proper footwear, can make you more likely to have a fall. Screenings, including: Osteoporosis screening. Osteoporosis is a condition that causes  the bones to get weaker and break more easily. Blood pressure screening. Blood pressure changes and medicines to control blood pressure can make you feel dizzy. Depression screening. You may be more likely to have a fall if you have a fear of falling, feel depressed, or feel unable to do activities that you used to do. Alcohol  use screening. Using too much alcohol  can affect your balance and may make you more likely to have a fall. Follow these instructions at home: Lifestyle Do not drink alcohol  if: Your health care provider tells you not to drink. If you drink alcohol : Limit how much you have to: 0-1 drink a day for women. 0-2 drinks a day for men. Know how much alcohol  is in your drink. In the [place] one drink equals one 12 oz bottle of beer (355 mL), one 5 oz glass of wine (148 mL), or one 1 oz glass of hard liquor (44 mL). Do not use any products that contain nicotine or tobacco. These products include cigarettes, chewing tobacco, and vaping devices, such as e-cigarettes. If you need help quitting, ask your health care provider. Activity  Follow a regular exercise program to stay fit. This will help you maintain your balance. Ask your health care provider what types of exercise are appropriate for you. If you need a cane or walker, use it as recommended by your health care provider. Wear supportive shoes that have nonskid soles. Safety  Remove any tripping hazards, such as rugs, cords, and clutter. Install safety equipment such as grab bars in bathrooms and safety rails on stairs. Keep rooms and walkways  well-lit. General instructions Talk with your health care provider about your risks for falling. Tell your health care provider if: You fall. Be sure to tell your health care provider about all falls, even ones that seem minor. You feel dizzy, tiredness (fatigue), or off-balance. Take over-the-counter and prescription medicines only as told by your health care provider. These include  supplements. Eat a healthy diet and maintain a healthy weight. A healthy diet includes low-fat dairy products, low-fat (lean) meats, and fiber from whole grains, beans, and lots of fruits and vegetables. Stay current with your vaccines. Schedule regular health, dental, and eye exams. Summary Having a healthy lifestyle and getting preventive care can help to protect your health and wellness after age 15. Screening and testing are the best way to find a health problem early and help you avoid having a fall. Early diagnosis and treatment give you the best chance for managing medical conditions that are more common for people who are older than age 42. Falls are a major cause of broken bones and head injuries in people who are older than age 64. Take precautions to prevent a fall at home. Work with your health care provider to learn what changes you can make to improve your health and wellness and to prevent falls. This information is not intended to replace advice given to you by your health care provider. Make sure you discuss any questions you have with your health care provider. Document Revised: 05/19/2020 Document Reviewed: 05/19/2020 Elsevier Patient Education  2024 [employer].

## 2023-11-03 ENCOUNTER — Other Ambulatory Visit (HOSPITAL_COMMUNITY): Payer: Self-pay

## 2023-11-03 ENCOUNTER — Other Ambulatory Visit: Payer: Self-pay

## 2023-11-03 DIAGNOSIS — R2681 Unsteadiness on feet: Secondary | ICD-10-CM | POA: Diagnosis not present

## 2023-11-03 DIAGNOSIS — R278 Other lack of coordination: Secondary | ICD-10-CM | POA: Diagnosis not present

## 2023-11-03 NOTE — Telephone Encounter (Signed)
 Copied from CRM [id number]. Topic: General - Other >> Nov 03, 2023 10:01 AM Jasmin G wrote: Reason for CRM: Staff from [facility] called to check on the status of PT evaluation from Sep 10th needed to be signed by Ms. Crawford, staff states that they've been sending evaluation every week with no response, I confirmed clinic's fax number. Please sign and fax form back at [phone] or [phone].

## 2023-11-03 NOTE — Telephone Encounter (Signed)
 All forms have been faxed back successfully each time they have been sent out. I have documented each time each form have been sent back

## 2023-11-04 ENCOUNTER — Telehealth: Payer: Self-pay

## 2023-11-04 ENCOUNTER — Other Ambulatory Visit: Payer: Self-pay

## 2023-11-04 NOTE — Telephone Encounter (Signed)
 Forms from [facility] received on 10/26/2023 and fax back on 11/03/2023 successfully  PT & Pt Evaluation& Plan Of Treatment  Certification Period: 09/21/2023-12/13/2023

## 2023-11-07 ENCOUNTER — Other Ambulatory Visit: Payer: Self-pay

## 2023-11-07 DIAGNOSIS — R488 Other symbolic dysfunctions: Secondary | ICD-10-CM | POA: Diagnosis not present

## 2023-11-07 DIAGNOSIS — R2681 Unsteadiness on feet: Secondary | ICD-10-CM | POA: Diagnosis not present

## 2023-11-07 DIAGNOSIS — R278 Other lack of coordination: Secondary | ICD-10-CM | POA: Diagnosis not present

## 2023-11-08 DIAGNOSIS — R2681 Unsteadiness on feet: Secondary | ICD-10-CM | POA: Diagnosis not present

## 2023-11-08 DIAGNOSIS — R278 Other lack of coordination: Secondary | ICD-10-CM | POA: Diagnosis not present

## 2023-11-09 ENCOUNTER — Other Ambulatory Visit: Payer: Self-pay

## 2023-11-09 DIAGNOSIS — R4789 Other speech disturbances: Secondary | ICD-10-CM | POA: Diagnosis not present

## 2023-11-10 DIAGNOSIS — R2681 Unsteadiness on feet: Secondary | ICD-10-CM | POA: Diagnosis not present

## 2023-11-10 DIAGNOSIS — R278 Other lack of coordination: Secondary | ICD-10-CM | POA: Diagnosis not present

## 2023-11-11 DIAGNOSIS — R278 Other lack of coordination: Secondary | ICD-10-CM | POA: Diagnosis not present

## 2023-11-14 DIAGNOSIS — R278 Other lack of coordination: Secondary | ICD-10-CM | POA: Diagnosis not present

## 2023-11-14 DIAGNOSIS — R488 Other symbolic dysfunctions: Secondary | ICD-10-CM | POA: Diagnosis not present

## 2023-11-14 DIAGNOSIS — R4789 Other speech disturbances: Secondary | ICD-10-CM | POA: Diagnosis not present

## 2023-11-14 DIAGNOSIS — R2681 Unsteadiness on feet: Secondary | ICD-10-CM | POA: Diagnosis not present

## 2023-11-15 DIAGNOSIS — R278 Other lack of coordination: Secondary | ICD-10-CM | POA: Diagnosis not present

## 2023-11-15 DIAGNOSIS — R2681 Unsteadiness on feet: Secondary | ICD-10-CM | POA: Diagnosis not present

## 2023-11-16 DIAGNOSIS — R4789 Other speech disturbances: Secondary | ICD-10-CM | POA: Diagnosis not present

## 2023-11-16 DIAGNOSIS — R278 Other lack of coordination: Secondary | ICD-10-CM | POA: Diagnosis not present

## 2023-11-16 DIAGNOSIS — R488 Other symbolic dysfunctions: Secondary | ICD-10-CM | POA: Diagnosis not present

## 2023-11-16 DIAGNOSIS — R2681 Unsteadiness on feet: Secondary | ICD-10-CM | POA: Diagnosis not present

## 2023-11-17 DIAGNOSIS — R278 Other lack of coordination: Secondary | ICD-10-CM | POA: Diagnosis not present

## 2023-11-21 DIAGNOSIS — R278 Other lack of coordination: Secondary | ICD-10-CM | POA: Diagnosis not present

## 2023-11-21 DIAGNOSIS — R2681 Unsteadiness on feet: Secondary | ICD-10-CM | POA: Diagnosis not present

## 2023-11-23 DIAGNOSIS — R278 Other lack of coordination: Secondary | ICD-10-CM | POA: Diagnosis not present

## 2023-11-23 DIAGNOSIS — R2681 Unsteadiness on feet: Secondary | ICD-10-CM | POA: Diagnosis not present

## 2023-11-28 ENCOUNTER — Other Ambulatory Visit: Payer: Self-pay

## 2023-11-28 DIAGNOSIS — R278 Other lack of coordination: Secondary | ICD-10-CM | POA: Diagnosis not present

## 2023-11-28 DIAGNOSIS — R2681 Unsteadiness on feet: Secondary | ICD-10-CM | POA: Diagnosis not present

## 2023-11-30 ENCOUNTER — Other Ambulatory Visit: Payer: Self-pay

## 2023-11-30 ENCOUNTER — Other Ambulatory Visit: Payer: Self-pay | Admitting: Internal Medicine

## 2023-11-30 MED ORDER — POTASSIUM CHLORIDE CRYS ER 10 MEQ PO TBCR
10.0000 meq | EXTENDED_RELEASE_TABLET | Freq: Every day | ORAL | 2 refills | Status: AC
  Filled 2023-11-30: qty 30, 30d supply, fill #0
  Filled 2024-01-09: qty 30, 30d supply, fill #1
  Filled 2024-02-08: qty 30, 30d supply, fill #2

## 2023-12-01 ENCOUNTER — Other Ambulatory Visit: Payer: Self-pay

## 2023-12-01 DIAGNOSIS — R278 Other lack of coordination: Secondary | ICD-10-CM | POA: Diagnosis not present

## 2023-12-01 DIAGNOSIS — R2681 Unsteadiness on feet: Secondary | ICD-10-CM | POA: Diagnosis not present

## 2023-12-05 DIAGNOSIS — R278 Other lack of coordination: Secondary | ICD-10-CM | POA: Diagnosis not present

## 2023-12-06 DIAGNOSIS — R278 Other lack of coordination: Secondary | ICD-10-CM | POA: Diagnosis not present

## 2023-12-07 DIAGNOSIS — R278 Other lack of coordination: Secondary | ICD-10-CM | POA: Diagnosis not present

## 2024-01-09 ENCOUNTER — Other Ambulatory Visit: Payer: Self-pay

## 2024-01-09 ENCOUNTER — Other Ambulatory Visit: Payer: Self-pay | Admitting: Internal Medicine

## 2024-01-09 DIAGNOSIS — F419 Anxiety disorder, unspecified: Secondary | ICD-10-CM

## 2024-01-10 ENCOUNTER — Other Ambulatory Visit (HOSPITAL_COMMUNITY): Payer: Self-pay

## 2024-01-10 ENCOUNTER — Other Ambulatory Visit: Payer: Self-pay

## 2024-01-10 MED ORDER — ESCITALOPRAM OXALATE 10 MG PO TABS
10.0000 mg | ORAL_TABLET | Freq: Every morning | ORAL | 1 refills | Status: AC
  Filled 2024-01-10: qty 30, 30d supply, fill #0
  Filled 2024-02-08: qty 30, 30d supply, fill #1

## 2024-01-10 MED ORDER — MIRTAZAPINE 7.5 MG PO TABS
7.5000 mg | ORAL_TABLET | Freq: Every day | ORAL | 1 refills | Status: AC
  Filled 2024-01-10: qty 30, 30d supply, fill #0
  Filled 2024-02-08: qty 30, 30d supply, fill #1

## 2024-01-11 ENCOUNTER — Other Ambulatory Visit: Payer: Self-pay

## 2024-02-08 ENCOUNTER — Other Ambulatory Visit: Payer: Self-pay | Admitting: Internal Medicine

## 2024-02-08 ENCOUNTER — Other Ambulatory Visit: Payer: Self-pay

## 2024-02-09 ENCOUNTER — Other Ambulatory Visit: Payer: Self-pay

## 2024-02-09 MED ORDER — AMLODIPINE BESYLATE 2.5 MG PO TABS
2.5000 mg | ORAL_TABLET | Freq: Every morning | ORAL | 0 refills | Status: DC
  Filled 2024-02-09: qty 30, 30d supply, fill #0

## 2024-02-09 MED ORDER — ATORVASTATIN CALCIUM 10 MG PO TABS
10.0000 mg | ORAL_TABLET | Freq: Every day | ORAL | 0 refills | Status: DC
  Filled 2024-02-09: qty 30, 30d supply, fill #0

## 2024-02-10 ENCOUNTER — Other Ambulatory Visit: Payer: Self-pay

## 2024-02-13 ENCOUNTER — Other Ambulatory Visit: Payer: Self-pay

## 2024-02-14 ENCOUNTER — Ambulatory Visit: Admitting: Internal Medicine

## 2024-02-14 ENCOUNTER — Other Ambulatory Visit: Payer: Self-pay

## 2024-02-14 ENCOUNTER — Encounter: Payer: Self-pay | Admitting: Internal Medicine

## 2024-02-14 ENCOUNTER — Other Ambulatory Visit (HOSPITAL_COMMUNITY): Payer: Self-pay

## 2024-02-14 ENCOUNTER — Ambulatory Visit: Payer: Self-pay | Admitting: Internal Medicine

## 2024-02-14 VITALS — BP 120/60 | HR 62 | Temp 98.3°F | Ht 70.0 in | Wt 165.2 lb

## 2024-02-14 DIAGNOSIS — R7303 Prediabetes: Secondary | ICD-10-CM

## 2024-02-14 DIAGNOSIS — E782 Mixed hyperlipidemia: Secondary | ICD-10-CM

## 2024-02-14 DIAGNOSIS — I10 Essential (primary) hypertension: Secondary | ICD-10-CM

## 2024-02-14 DIAGNOSIS — H905 Unspecified sensorineural hearing loss: Secondary | ICD-10-CM

## 2024-02-14 DIAGNOSIS — R252 Cramp and spasm: Secondary | ICD-10-CM

## 2024-02-14 LAB — COMPREHENSIVE METABOLIC PANEL WITH GFR
ALT: 13 U/L (ref 3–53)
AST: 19 U/L (ref 5–37)
Albumin: 4 g/dL (ref 3.5–5.2)
Alkaline Phosphatase: 47 U/L (ref 39–117)
BUN: 20 mg/dL (ref 6–23)
CO2: 30 meq/L (ref 19–32)
Calcium: 9.4 mg/dL (ref 8.4–10.5)
Chloride: 102 meq/L (ref 96–112)
Creatinine, Ser: 1.12 mg/dL (ref 0.40–1.50)
GFR: 56.89 mL/min — ABNORMAL LOW
Glucose, Bld: 83 mg/dL (ref 70–99)
Potassium: 4 meq/L (ref 3.5–5.1)
Sodium: 139 meq/L (ref 135–145)
Total Bilirubin: 0.8 mg/dL (ref 0.2–1.2)
Total Protein: 7.2 g/dL (ref 6.0–8.3)

## 2024-02-14 LAB — LIPID PANEL
Cholesterol: 111 mg/dL (ref 28–200)
HDL: 62.8 mg/dL
LDL Cholesterol: 35 mg/dL (ref 10–99)
NonHDL: 48.61
Total CHOL/HDL Ratio: 2
Triglycerides: 68 mg/dL (ref 10.0–149.0)
VLDL: 13.6 mg/dL (ref 0.0–40.0)

## 2024-02-14 LAB — HEMOGLOBIN A1C: Hgb A1c MFr Bld: 6.1 % (ref 4.6–6.5)

## 2024-02-14 MED ORDER — GABAPENTIN 100 MG PO CAPS: 100.0000 mg | ORAL_CAPSULE | Freq: Every evening | ORAL | 3 refills | Status: AC

## 2024-02-14 MED ORDER — AMLODIPINE BESYLATE 2.5 MG PO TABS: 2.5000 mg | ORAL_TABLET | Freq: Every morning | ORAL | 3 refills | Status: AC

## 2024-02-14 MED ORDER — ATORVASTATIN CALCIUM 10 MG PO TABS: 10.0000 mg | ORAL_TABLET | Freq: Every day | ORAL | 3 refills | Status: AC

## 2024-02-14 MED ORDER — HYDROCHLOROTHIAZIDE 25 MG PO TABS: 25.0000 mg | ORAL_TABLET | Freq: Every day | ORAL | 3 refills | Status: AC

## 2024-02-14 MED ORDER — APIXABAN 5 MG PO TABS: 5.0000 mg | ORAL_TABLET | Freq: Two times a day (BID) | ORAL | 3 refills | Status: AC

## 2024-02-14 NOTE — Patient Instructions (Signed)
 We have refilled the medicine today for the 90 day supply of everything.

## 2024-02-17 ENCOUNTER — Other Ambulatory Visit (HOSPITAL_COMMUNITY): Payer: Self-pay

## 2024-02-17 DIAGNOSIS — R252 Cramp and spasm: Secondary | ICD-10-CM | POA: Insufficient documentation

## 2024-06-26 ENCOUNTER — Ambulatory Visit
